# Patient Record
Sex: Female | Born: 1990 | Race: White | Hispanic: No | Smoking: Former smoker
Health system: Southern US, Community
[De-identification: ages and names within clinical notes are randomized; demographics above are authoritative.]

## PROBLEM LIST (undated history)

## (undated) DIAGNOSIS — F3181 Bipolar II disorder: Secondary | ICD-10-CM

## (undated) DIAGNOSIS — F419 Anxiety disorder, unspecified: Secondary | ICD-10-CM

## (undated) DIAGNOSIS — R011 Cardiac murmur, unspecified: Secondary | ICD-10-CM

## (undated) DIAGNOSIS — F909 Attention-deficit hyperactivity disorder, unspecified type: Secondary | ICD-10-CM

## (undated) DIAGNOSIS — F329 Major depressive disorder, single episode, unspecified: Secondary | ICD-10-CM

## (undated) DIAGNOSIS — F32A Depression, unspecified: Secondary | ICD-10-CM

## (undated) HISTORY — PX: CLAVICLE SURGERY: SHX598

## (undated) HISTORY — PX: PARTIAL HIP ARTHROPLASTY: SHX733

## (undated) HISTORY — DX: Bipolar II disorder: F31.81

## (undated) HISTORY — DX: Major depressive disorder, single episode, unspecified: F32.9

## (undated) HISTORY — DX: Anxiety disorder, unspecified: F41.9

## (undated) HISTORY — DX: Depression, unspecified: F32.A

## (undated) HISTORY — PX: CARDIAC SURGERY: SHX584

## (undated) HISTORY — DX: Cardiac murmur, unspecified: R01.1

## (undated) HISTORY — DX: Attention-deficit hyperactivity disorder, unspecified type: F90.9

---
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
## 2015-10-09 ENCOUNTER — Encounter: Payer: Self-pay | Admitting: Adult Health

## 2017-01-04 DIAGNOSIS — L709 Acne, unspecified: Secondary | ICD-10-CM | POA: Diagnosis not present

## 2017-01-04 DIAGNOSIS — D229 Melanocytic nevi, unspecified: Secondary | ICD-10-CM | POA: Diagnosis not present

## 2017-01-04 DIAGNOSIS — L308 Other specified dermatitis: Secondary | ICD-10-CM | POA: Diagnosis not present

## 2017-02-15 DIAGNOSIS — Z0000 Encounter for general adult medical examination without abnormal findings: Secondary | ICD-10-CM | POA: Diagnosis not present

## 2017-02-15 DIAGNOSIS — R5383 Other fatigue: Secondary | ICD-10-CM | POA: Diagnosis not present

## 2017-02-15 DIAGNOSIS — L858 Other specified epidermal thickening: Secondary | ICD-10-CM | POA: Diagnosis not present

## 2017-02-15 DIAGNOSIS — L709 Acne, unspecified: Secondary | ICD-10-CM | POA: Diagnosis not present

## 2017-05-16 DIAGNOSIS — J40 Bronchitis, not specified as acute or chronic: Secondary | ICD-10-CM | POA: Diagnosis not present

## 2017-05-16 DIAGNOSIS — R05 Cough: Secondary | ICD-10-CM | POA: Diagnosis not present

## 2017-06-21 ENCOUNTER — Encounter: Payer: Self-pay | Admitting: Physician Assistant

## 2017-06-21 ENCOUNTER — Ambulatory Visit (INDEPENDENT_AMBULATORY_CARE_PROVIDER_SITE_OTHER): Payer: 59 | Admitting: Physician Assistant

## 2017-06-21 VITALS — BP 115/78 | HR 85 | Temp 98.9°F | Ht 64.5 in | Wt 223.8 lb

## 2017-06-21 DIAGNOSIS — F419 Anxiety disorder, unspecified: Secondary | ICD-10-CM | POA: Diagnosis not present

## 2017-06-21 DIAGNOSIS — F321 Major depressive disorder, single episode, moderate: Secondary | ICD-10-CM | POA: Diagnosis not present

## 2017-06-21 DIAGNOSIS — F39 Unspecified mood [affective] disorder: Secondary | ICD-10-CM | POA: Diagnosis not present

## 2017-06-21 MED ORDER — ARIPIPRAZOLE 5 MG PO TABS: 5.0000 mg | ORAL_TABLET | Freq: Every day | ORAL | 1 refills | Status: DC

## 2017-06-21 MED ORDER — TRAZODONE HCL 50 MG PO TABS: 50.0000 mg | ORAL_TABLET | Freq: Every day | ORAL | 1 refills | Status: DC

## 2017-06-21 MED ORDER — HYDROXYZINE HCL 25 MG PO TABS: 25.0000 mg | ORAL_TABLET | Freq: Three times a day (TID) | ORAL | 5 refills | Status: DC

## 2017-06-21 NOTE — Patient Instructions (Signed)
In a few days you may receive a survey in the mail or online from [employer] regarding your visit with us today. Please take a moment to fill this out. Your feedback is very important to our whole office. It can help us better understand your needs as well as improve your experience and satisfaction. Thank you for taking your time to complete it. We care about you.  Lucee Brissett, PA-C  

## 2017-06-21 NOTE — Progress Notes (Signed)
BP 115/78   Pulse 85   Temp 98.9 F (37.2 C) (Oral)   Ht 5' 4.5" (1.638 m)   Wt 223 lb 12.8 oz (101.5 kg)   LMP 05/22/2017   BMI 37.82 kg/m    Subjective:    Patient ID: Paula Elliott, female    DOB: 1991/01/14, 26 y.o.   MRN: [id number]  HPI: Paula Elliott is a 26 y.o. female presenting on 06/21/2017 for New Patient (Initial Visit) and Establish Care  This is a new patient to our office. She was formerly seen with Dr. Wenda Overland. She comes here to be established for her treatment of her depression and anxiety. She also has chronic acne. She is seen by Dr. Denna Haggard for this. Over the years the patient has seen a therapist on a regular basis for her depression and anxiety. She has taken depression medication in the past. Wellbutrin and it made her feel more anxious. She has been on Prozac now for a few months. She has gone up to 40 mg. She states she still has significant anxiety and very significant sleep disturbance. She has never been checked for mood disorder. Her mood MDQ score is 8, with overall minor problem in functioning. She has not been arrested for any behaviors. Due to the difficulty of treating her depression with an SSRI alone I have suggested that we try Abilify to augment this treatment. Plus it may help some with the agitation symptoms that are found more in mood disorder. Depression screen PHQ 2/9 06/21/2017  Decreased Interest 3  Down, Depressed, Hopeless 1  PHQ - 2 Score 4  Altered sleeping 3  Tired, decreased energy 3  Change in appetite 3  Feeling bad or failure about yourself  2  Trouble concentrating 2  Moving slowly or fidgety/restless 0  Suicidal thoughts 0  PHQ-9 Score 17     Relevant past medical, surgical, family and social history reviewed and updated as indicated. Allergies and medications reviewed and updated.  Past Medical History:  Diagnosis Date  . Anxiety   . Depression   . Heart murmur     Past Surgical History:  Procedure  Laterality Date  . HIP OPEN REDUCTION  1994  . open heart   1996    Review of Systems  Constitutional: Negative.  Negative for activity change, fatigue and fever.  HENT: Negative.   Eyes: Negative.   Respiratory: Negative.  Negative for cough.   Cardiovascular: Negative.  Negative for chest pain.  Gastrointestinal: Negative.  Negative for abdominal pain.  Endocrine: Negative.   Genitourinary: Negative.  Negative for dysuria.  Musculoskeletal: Negative.   Skin: Negative.   Neurological: Negative.   Psychiatric/Behavioral: Positive for agitation, dysphoric mood and sleep disturbance. Negative for self-injury and suicidal ideas. The patient is nervous/anxious.     Allergies as of 06/21/2017   No Known Allergies     Medication List       Accurate as of 06/21/17  3:35 PM. Always use your most recent med list.          ARIPiprazole 5 MG tablet Commonly known as:  ABILIFY Take 1 tablet (5 mg total) by mouth daily.   clindamycin 1 % gel Commonly known as:  CLINDAGEL Apply topically 2 (two) times daily.   FLUoxetine 40 MG capsule Commonly known as:  PROZAC Take 40 mg by mouth daily.   hydrOXYzine 25 MG tablet Commonly known as:  ATARAX/VISTARIL Take 1 tablet (25 mg total) by mouth 3 (three)  times daily.   minocycline 50 MG capsule Commonly known as:  MINOCIN,DYNACIN Take by mouth daily.   traZODone 50 MG tablet Commonly known as:  DESYREL Take 1 tablet (50 mg total) by mouth at bedtime.          Objective:    BP 115/78   Pulse 85   Temp 98.9 F (37.2 C) (Oral)   Ht 5' 4.5" (1.638 m)   Wt 223 lb 12.8 oz (101.5 kg)   LMP 05/22/2017   BMI 37.82 kg/m   No Known Allergies  Physical Exam  Constitutional: She is oriented to person, place, and time. She appears well-developed and well-nourished.  HENT:  Head: Normocephalic and atraumatic.  Right Ear: Tympanic membrane, external ear and ear canal normal.  Left Ear: Tympanic membrane, external ear and ear  canal normal.  Nose: Nose normal. No rhinorrhea.  Mouth/Throat: Oropharynx is clear and moist and mucous membranes are normal. No oropharyngeal exudate or posterior oropharyngeal erythema.  Eyes: Pupils are equal, round, and reactive to light. Conjunctivae and EOM are normal.  Neck: Normal range of motion. Neck supple.  Cardiovascular: Normal rate, regular rhythm, normal heart sounds and intact distal pulses.   Pulmonary/Chest: Effort normal and breath sounds normal.  Abdominal: Soft. Bowel sounds are normal.  Neurological: She is alert and oriented to person, place, and time. She has normal reflexes.  Skin: Skin is warm and dry. No rash noted.  Psychiatric: Her speech is normal and behavior is normal. Judgment and thought content normal. Her mood appears anxious. Her affect is not inappropriate. Cognition and memory are normal.    No results found for this or any previous visit.    Assessment & Plan:   1. Depression, major, single episode, moderate (HCC) - FLUoxetine (PROZAC) 40 MG capsule; Take 40 mg by mouth daily.; Refill: 2 - ARIPiprazole (ABILIFY) 5 MG tablet; Take 1 tablet (5 mg total) by mouth daily.  Dispense: 30 tablet; Refill: 1 - traZODone (DESYREL) 50 MG tablet; Take 1 tablet (50 mg total) by mouth at bedtime.  Dispense: 30 tablet; Refill: 1  2. Mood disorder (HCC) - ARIPiprazole (ABILIFY) 5 MG tablet; Take 1 tablet (5 mg total) by mouth daily.  Dispense: 30 tablet; Refill: 1  3. Anxiety - hydrOXYzine (ATARAX/VISTARIL) 25 MG tablet; Take 1 tablet (25 mg total) by mouth 3 (three) times daily.  Dispense: 90 tablet; Refill: 5   Current Outpatient Prescriptions:  .  clindamycin (CLINDAGEL) 1 % gel, Apply topically 2 (two) times daily., Disp: , Rfl:  .  FLUoxetine (PROZAC) 40 MG capsule, Take 40 mg by mouth daily., Disp: , Rfl: 2 .  hydrOXYzine (ATARAX/VISTARIL) 25 MG tablet, Take 1 tablet (25 mg total) by mouth 3 (three) times daily., Disp: 90 tablet, Rfl: 5 .  minocycline  (MINOCIN,DYNACIN) 50 MG capsule, Take by mouth daily., Disp: , Rfl: 3 .  ARIPiprazole (ABILIFY) 5 MG tablet, Take 1 tablet (5 mg total) by mouth daily., Disp: 30 tablet, Rfl: 1 .  traZODone (DESYREL) 50 MG tablet, Take 1 tablet (50 mg total) by mouth at bedtime., Disp: 30 tablet, Rfl: 1  Continue all other maintenance medications as listed above.  Follow up plan: Return in about 4 weeks (around 07/19/2017) for recheck.  Educational handout given for No Name PA-C [facility] [address]  [place], [place] [address] [phone]   06/21/2017, 3:35 PM

## 2017-07-19 ENCOUNTER — Ambulatory Visit (INDEPENDENT_AMBULATORY_CARE_PROVIDER_SITE_OTHER): Payer: 59 | Admitting: Physician Assistant

## 2017-07-19 ENCOUNTER — Encounter: Payer: Self-pay | Admitting: Physician Assistant

## 2017-07-19 VITALS — BP 125/80 | HR 83 | Temp 98.6°F | Ht 64.5 in | Wt 228.2 lb

## 2017-07-19 DIAGNOSIS — F410 Panic disorder [episodic paroxysmal anxiety] without agoraphobia: Secondary | ICD-10-CM | POA: Diagnosis not present

## 2017-07-19 DIAGNOSIS — F321 Major depressive disorder, single episode, moderate: Secondary | ICD-10-CM | POA: Diagnosis not present

## 2017-07-19 DIAGNOSIS — F39 Unspecified mood [affective] disorder: Secondary | ICD-10-CM | POA: Diagnosis not present

## 2017-07-19 MED ORDER — ALPRAZOLAM 0.5 MG PO TABS: 0.5000 mg | ORAL_TABLET | Freq: Two times a day (BID) | ORAL | 0 refills | Status: DC | PRN

## 2017-07-19 MED ORDER — ARIPIPRAZOLE 15 MG PO TABS: 15.0000 mg | ORAL_TABLET | Freq: Every day | ORAL | 1 refills | Status: DC

## 2017-07-19 NOTE — Patient Instructions (Signed)
In a few days you may receive a survey in the mail or online from [employer] regarding your visit with us today. Please take a moment to fill this out. Your feedback is very important to our whole office. It can help us better understand your needs as well as improve your experience and satisfaction. Thank you for taking your time to complete it. We care about you.  Shandel Busic, PA-C  

## 2017-07-19 NOTE — Progress Notes (Signed)
BP 125/80   Pulse 83   Temp 98.6 F (37 C) (Oral)   Ht 5' 4.5" (1.638 m)   Wt 228 lb 3.2 oz (103.5 kg)   BMI 38.57 kg/m    Subjective:    Patient ID: Paula Elliott, female    DOB: 1991/06/06, 26 y.o.   MRN: [id number]  HPI: Paula Elliott is a 26 y.o. female presenting on 07/19/2017 for Follow-up (1 month rck on depression )  This patient comes in for periodic recheck on medications and conditions including Depression, mood disorder, panic attack. She reports that she had more panic attacks this past month. She was driving in [place] and had a severe one. She has never had a problem with that in the past. The hydroxyzine has not made a big difference in her symptoms.    All medications are reviewed today. There are no reports of any problems with the medications. All of the medical conditions are reviewed and updated.  Lab work is reviewed and will be ordered as medically necessary. There are no new problems reported with today's visit.   Relevant past medical, surgical, family and social history reviewed and updated as indicated. Allergies and medications reviewed and updated.  Past Medical History:  Diagnosis Date  . Anxiety   . Depression   . Heart murmur     Past Surgical History:  Procedure Laterality Date  . HIP OPEN REDUCTION  1994  . open heart   1996    Review of Systems  Constitutional: Negative.   HENT: Negative.   Eyes: Negative.   Respiratory: Negative.   Gastrointestinal: Negative.   Genitourinary: Negative.   Psychiatric/Behavioral: Positive for decreased concentration. The patient is nervous/anxious.     Allergies as of 07/19/2017   No Known Allergies     Medication List       Accurate as of 07/19/17 11:59 PM. Always use your most recent med list.          ALPRAZolam 0.5 MG tablet Commonly known as:  XANAX Take 1 tablet (0.5 mg total) by mouth 2 (two) times daily as needed for anxiety.   ARIPiprazole 15 MG  tablet Commonly known as:  ABILIFY Take 1 tablet (15 mg total) by mouth daily.   clindamycin 1 % gel Commonly known as:  CLINDAGEL Apply topically 2 (two) times daily.   FLUoxetine 40 MG capsule Commonly known as:  PROZAC Take 40 mg by mouth daily.   hydrOXYzine 25 MG tablet Commonly known as:  ATARAX/VISTARIL Take 1 tablet (25 mg total) by mouth 3 (three) times daily.   minocycline 50 MG capsule Commonly known as:  MINOCIN,DYNACIN Take by mouth daily.   traZODone 50 MG tablet Commonly known as:  DESYREL Take 1 tablet (50 mg total) by mouth at bedtime.            Discharge Care Instructions        Start     Ordered   07/19/17 0000  ARIPiprazole (ABILIFY) 15 MG tablet  Daily    Question:  Supervising Provider  Answer:  Timmothy Euler   07/19/17 1421   07/19/17 0000  ALPRAZolam (XANAX) 0.5 MG tablet  2 times daily PRN    Question:  Supervising Provider  Answer:  Timmothy Euler   07/19/17 1421         Objective:    BP 125/80   Pulse 83   Temp 98.6 F (37 C) (Oral)   Ht 5' 4.5" (  1.638 m)   Wt 228 lb 3.2 oz (103.5 kg)   BMI 38.57 kg/m   No Known Allergies  Physical Exam  Constitutional: She is oriented to person, place, and time. She appears well-developed and well-nourished.  HENT:  Head: Normocephalic and atraumatic.  Eyes: Pupils are equal, round, and reactive to light. Conjunctivae and EOM are normal.  Cardiovascular: Normal rate, regular rhythm, normal heart sounds and intact distal pulses.   Pulmonary/Chest: Effort normal and breath sounds normal.  Abdominal: Soft. Bowel sounds are normal.  Neurological: She is alert and oriented to person, place, and time. She has normal reflexes.  Skin: Skin is warm and dry. No rash noted.  Psychiatric: She has a normal mood and affect. Her behavior is normal. Judgment and thought content normal.  Nursing note and vitals reviewed.   No results found for this or any previous visit.    Assessment &  Plan:   1. Depression, major, single episode, moderate (HCC) - ARIPiprazole (ABILIFY) 15 MG tablet; Take 1 tablet (15 mg total) by mouth daily.  Dispense: 30 tablet; Refill: 1  2. Mood disorder (HCC) - ARIPiprazole (ABILIFY) 15 MG tablet; Take 1 tablet (15 mg total) by mouth daily.  Dispense: 30 tablet; Refill: 1  3. Panic attacks - ALPRAZolam (XANAX) 0.5 MG tablet; Take 1 tablet (0.5 mg total) by mouth 2 (two) times daily as needed for anxiety.  Dispense: 30 tablet; Refill: 0    Current Outpatient Prescriptions:  .  ARIPiprazole (ABILIFY) 15 MG tablet, Take 1 tablet (15 mg total) by mouth daily., Disp: 30 tablet, Rfl: 1 .  clindamycin (CLINDAGEL) 1 % gel, Apply topically 2 (two) times daily., Disp: , Rfl:  .  FLUoxetine (PROZAC) 40 MG capsule, Take 40 mg by mouth daily., Disp: , Rfl: 2 .  hydrOXYzine (ATARAX/VISTARIL) 25 MG tablet, Take 1 tablet (25 mg total) by mouth 3 (three) times daily., Disp: 90 tablet, Rfl: 5 .  minocycline (MINOCIN,DYNACIN) 50 MG capsule, Take by mouth daily., Disp: , Rfl: 3 .  traZODone (DESYREL) 50 MG tablet, Take 1 tablet (50 mg total) by mouth at bedtime., Disp: 30 tablet, Rfl: 1 .  ALPRAZolam (XANAX) 0.5 MG tablet, Take 1 tablet (0.5 mg total) by mouth 2 (two) times daily as needed for anxiety., Disp: 30 tablet, Rfl: 0 Continue all other maintenance medications as listed above.  Follow up plan: Return in about 4 weeks (around 08/16/2017) for recheck.  Educational handout given for [place] PA-C [facility] [address]  [place], [place] [address] [phone]   07/20/2017, 2:11 PM

## 2017-07-20 DIAGNOSIS — F410 Panic disorder [episodic paroxysmal anxiety] without agoraphobia: Secondary | ICD-10-CM | POA: Insufficient documentation

## 2017-07-27 ENCOUNTER — Telehealth: Payer: Self-pay | Admitting: Physician Assistant

## 2017-07-27 NOTE — Telephone Encounter (Signed)
The Abilify not working at all and Xanax states she has to take 2 to feel calm at all from a panic attack. Patient use [facility]. Please advise.

## 2017-07-27 NOTE — Telephone Encounter (Signed)
Pt notified of recommendation Will discuss at next appt on 9/21 per pt

## 2017-07-27 NOTE — Telephone Encounter (Signed)
Cannot refill this outside of an appointment. Needs to be seen.

## 2017-08-19 ENCOUNTER — Encounter (INDEPENDENT_AMBULATORY_CARE_PROVIDER_SITE_OTHER): Payer: Self-pay

## 2017-08-19 ENCOUNTER — Encounter: Payer: Self-pay | Admitting: Physician Assistant

## 2017-08-19 ENCOUNTER — Ambulatory Visit (INDEPENDENT_AMBULATORY_CARE_PROVIDER_SITE_OTHER): Payer: 59 | Admitting: Physician Assistant

## 2017-08-19 VITALS — BP 98/68 | HR 84 | Temp 99.0°F | Ht 64.5 in | Wt 232.0 lb

## 2017-08-19 DIAGNOSIS — F39 Unspecified mood [affective] disorder: Secondary | ICD-10-CM | POA: Diagnosis not present

## 2017-08-19 DIAGNOSIS — F419 Anxiety disorder, unspecified: Secondary | ICD-10-CM

## 2017-08-19 DIAGNOSIS — F321 Major depressive disorder, single episode, moderate: Secondary | ICD-10-CM

## 2017-08-19 NOTE — Progress Notes (Signed)
BP 98/68   Pulse 84   Temp 99 F (37.2 C) (Oral)   Ht 5' 4.5" (1.638 m)   Wt 232 lb (105.2 kg)   BMI 39.21 kg/m    Subjective:    Patient ID: Paula Elliott, female    DOB: 09-Dec-1990, 26 y.o.   MRN: [id number]  HPI: Paula Elliott is a 26 y.o. female presenting on 08/19/2017 for Follow-up (4 week rck)  This patient comes in for recheck on her medications. She is being seen by [facility] for psychiatry and treatment. There've been some changes to her medication. These are noted in her med list. She states that she is only been changed for less than a week. She cannot tell a big difference yet. She is comfortable going to this practice. She is still seeing a psychologist for counseling.  I've encouraged her to continue with this line of treatment as I think it will be very beneficial for her. She can return to [place] as needed. We will see her back if she is sick in any way.  Relevant past medical, surgical, family and social history reviewed and updated as indicated. Allergies and medications reviewed and updated.  Past Medical History:  Diagnosis Date  . Anxiety   . Depression   . Heart murmur     Past Surgical History:  Procedure Laterality Date  . HIP OPEN REDUCTION  1994  . open heart   1996    Review of Systems  Constitutional: Negative.   HENT: Negative.   Eyes: Negative.   Respiratory: Negative.   Gastrointestinal: Negative.   Genitourinary: Negative.   Psychiatric/Behavioral: Positive for agitation, decreased concentration, dysphoric mood and sleep disturbance. Negative for suicidal ideas. The patient is nervous/anxious.     Allergies as of 08/19/2017   No Known Allergies     Medication List       Accurate as of 08/19/17  5:51 PM. Always use your most recent med list.          ARIPiprazole 5 MG tablet Commonly known as:  ABILIFY Take 5 mg by mouth daily.   clindamycin 1 % gel Commonly known as:  CLINDAGEL Apply topically  2 (two) times daily.   FLUoxetine 40 MG capsule Commonly known as:  PROZAC Take 40 mg by mouth daily.   hydrOXYzine 25 MG tablet Commonly known as:  ATARAX/VISTARIL Take 1 tablet (25 mg total) by mouth 3 (three) times daily.   lamoTRIgine 25 MG tablet Commonly known as:  LAMICTAL TAKE ONE TABLET BY MOUTH DAILY FOR 14 DAYS, THEN TAKE TWO DAILY FOR 14 DAYS, THEN INCREASE TO FOUR DAILY (CALL AND AT NOON IF TO RASH) (IF O   LORazepam 0.5 MG tablet Commonly known as:  ATIVAN take ONE-HALF - ONE tablet BY MOUTH EVERY 6 HOURS AS NEEDED FOR SEVERE FOR ANXIETY (this is a 30 DAY supply)   minocycline 50 MG capsule Commonly known as:  MINOCIN,DYNACIN Take by mouth daily.   propranolol 20 MG tablet Commonly known as:  INDERAL TAKE ONE TABLET BY MOUTH EVERY 6 HOURS AS NEEDED FOR FOR ANXIETY   traZODone 50 MG tablet Commonly known as:  DESYREL Take 1 tablet (50 mg total) by mouth at bedtime.          Objective:    BP 98/68   Pulse 84   Temp 99 F (37.2 C) (Oral)   Ht 5' 4.5" (1.638 m)   Wt 232 lb (105.2 kg)   BMI  39.21 kg/m   No Known Allergies  Physical Exam  Constitutional: She is oriented to person, place, and time. She appears well-developed and well-nourished.  HENT:  Head: Normocephalic and atraumatic.  Eyes: Pupils are equal, round, and reactive to light. Conjunctivae and EOM are normal.  Cardiovascular: Normal rate, regular rhythm, normal heart sounds and intact distal pulses.   Pulmonary/Chest: Effort normal and breath sounds normal.  Abdominal: Soft. Bowel sounds are normal.  Neurological: She is alert and oriented to person, place, and time. She has normal reflexes.  Skin: Skin is warm and dry. No rash noted.  Psychiatric: She has a normal mood and affect. Her behavior is normal. Judgment and thought content normal.  Nursing note and vitals reviewed.   No results found for this or any previous visit.    Assessment & Plan:   1. Depression, major, single  episode, moderate ([facility]) Continue counseling with [facility]. - ARIPiprazole (ABILIFY) 5 MG tablet; Take 5 mg by mouth daily.; Refill: 1 - lamoTRIgine (LAMICTAL) 25 MG tablet; TAKE ONE TABLET BY MOUTH DAILY FOR 14 DAYS, THEN TAKE TWO DAILY FOR 14 DAYS, THEN INCREASE TO FOUR DAILY (CALL AND AT NOON IF TO RASH) (IF O; Refill: 0  2. Anxiety - propranolol (INDERAL) 20 MG tablet; TAKE ONE TABLET BY MOUTH EVERY 6 HOURS AS NEEDED FOR FOR ANXIETY; Refill: 1 - LORazepam (ATIVAN) 0.5 MG tablet; take ONE-HALF - ONE tablet BY MOUTH EVERY 6 HOURS AS NEEDED FOR SEVERE FOR ANXIETY (this is a 30 DAY supply); Refill: 1  3. Mood disorder (HCC) - ARIPiprazole (ABILIFY) 5 MG tablet; Take 5 mg by mouth daily.; Refill: 1 - lamoTRIgine (LAMICTAL) 25 MG tablet; TAKE ONE TABLET BY MOUTH DAILY FOR 14 DAYS, THEN TAKE TWO DAILY FOR 14 DAYS, THEN INCREASE TO FOUR DAILY (CALL AND AT NOON IF TO RASH) (IF O; Refill: 0   [facility] to follow with [facility]. We will see her back as needed. I have asked her to come back in 6 months. She can call if she is sick in any way.   Current Outpatient Prescriptions:  .  ARIPiprazole (ABILIFY) 5 MG tablet, Take 5 mg by mouth daily., Disp: , Rfl: 1 .  clindamycin (CLINDAGEL) 1 % gel, Apply topically 2 (two) times daily., Disp: , Rfl:  .  FLUoxetine (PROZAC) 40 MG capsule, Take 40 mg by mouth daily., Disp: , Rfl: 2 .  hydrOXYzine (ATARAX/VISTARIL) 25 MG tablet, Take 1 tablet (25 mg total) by mouth 3 (three) times daily., Disp: 90 tablet, Rfl: 5 .  lamoTRIgine (LAMICTAL) 25 MG tablet, TAKE ONE TABLET BY MOUTH DAILY FOR 14 DAYS, THEN TAKE TWO DAILY FOR 14 DAYS, THEN INCREASE TO FOUR DAILY (CALL AND AT NOON IF TO RASH) (IF O, Disp: , Rfl: 0 .  LORazepam (ATIVAN) 0.5 MG tablet, take ONE-HALF - ONE tablet BY MOUTH EVERY 6 HOURS AS NEEDED FOR SEVERE FOR ANXIETY (this is a 30 DAY supply), Disp: , Rfl: 1 .  minocycline (MINOCIN,DYNACIN) 50 MG capsule, Take by mouth daily., Disp: , Rfl:  3 .  propranolol (INDERAL) 20 MG tablet, TAKE ONE TABLET BY MOUTH EVERY 6 HOURS AS NEEDED FOR FOR ANXIETY, Disp: , Rfl: 1 .  traZODone (DESYREL) 50 MG tablet, Take 1 tablet (50 mg total) by mouth at bedtime., Disp: 30 tablet, Rfl: 1 Continue all other maintenance medications as listed above.  Follow up plan: Return in about 6 months (around 02/16/2018).  Educational handout given for survey  Terald Sleeper PA-C [facility] [address]  [place], [place] [address] [phone]   08/19/2017, 5:51 PM

## 2017-08-19 NOTE — Patient Instructions (Signed)
In a few days you may receive a survey in the mail or online from [employer] regarding your visit with us today. Please take a moment to fill this out. Your feedback is very important to our whole office. It can help us better understand your needs as well as improve your experience and satisfaction. Thank you for taking your time to complete it. We care about you.  Brayton Baumgartner, PA-C  

## 2017-10-11 DIAGNOSIS — H16042 Marginal corneal ulcer, left eye: Secondary | ICD-10-CM | POA: Diagnosis not present

## 2017-11-30 ENCOUNTER — Telehealth: Payer: Self-pay | Admitting: Physician Assistant

## 2017-11-30 NOTE — Telephone Encounter (Signed)
Mom states that patient has been having constipation x 1 week. Has been eating prunes and taking otc women's laxatives and it is not helping. Wanting to see Paula Elliott and has apt with her 1/11- wanting to know if there is anything else she can try until her apt with Paula Elliott? Covering PCP please advise

## 2017-11-30 NOTE — Telephone Encounter (Signed)
Needs to be seen

## 2017-12-01 ENCOUNTER — Encounter: Payer: Self-pay | Admitting: Nurse Practitioner

## 2017-12-01 ENCOUNTER — Ambulatory Visit (INDEPENDENT_AMBULATORY_CARE_PROVIDER_SITE_OTHER): Payer: 59

## 2017-12-01 ENCOUNTER — Ambulatory Visit: Payer: 59 | Admitting: Nurse Practitioner

## 2017-12-01 VITALS — BP 114/79 | HR 70 | Temp 97.9°F | Ht 64.0 in | Wt 223.0 lb

## 2017-12-01 DIAGNOSIS — K5900 Constipation, unspecified: Secondary | ICD-10-CM | POA: Diagnosis not present

## 2017-12-01 DIAGNOSIS — R109 Unspecified abdominal pain: Secondary | ICD-10-CM

## 2017-12-01 MED ORDER — LINACLOTIDE 145 MCG PO CAPS: 145.0000 ug | ORAL_CAPSULE | Freq: Every day | ORAL | 0 refills | Status: DC

## 2017-12-01 NOTE — Telephone Encounter (Signed)
Pt given appt today with MMM at 2:15.

## 2017-12-01 NOTE — Patient Instructions (Signed)

## 2017-12-01 NOTE — Progress Notes (Signed)
   Subjective:    Patient ID: Paula Elliott, female    DOB: 16-Jul-1991, 27 y.o.   MRN: [id number]  HPI Patient comes in today c/o constipation her whole life. Laxative helps some but then goes back to constipation. Took laxative yesterday and had good results this morning    Review of Systems  Constitutional: Negative.   Respiratory: Negative.   Cardiovascular: Negative.   Gastrointestinal: Negative.   Genitourinary: Negative.   Neurological: Negative.   Psychiatric/Behavioral: Negative.   All other systems reviewed and are negative.      Objective:   Physical Exam  Constitutional: She is oriented to person, place, and time. She appears well-developed and well-nourished. No distress.  Cardiovascular: Normal rate and regular rhythm.  Pulmonary/Chest: Effort normal and breath sounds normal.  Neurological: She is alert and oriented to person, place, and time.  Skin: Skin is warm.  Psychiatric: She has a normal mood and affect. Her behavior is normal. Judgment and thought content normal.   BP 114/79   Pulse 70   Temp 97.9 F (36.6 C) (Oral)   Ht 5\' 4"  (1.626 m)   Wt 223 lb (101.2 kg)   BMI 38.28 kg/m   KUB- negative-Preliminary reading by Ronnald Collum, FNP  [facility]     Assessment & Plan:  1. Abdominal pain, unspecified abdominal location - DG Abd 1 View; Future  2. Constipation, unspecified constipation type Meds ordered this encounter  Medications  . linaclotide (LINZESS) 145 MCG CAPS capsule    Sig: Take 1 capsule (145 mcg total) by mouth daily before breakfast.    Dispense:  24 capsule    Refill:  0    Order Specific Question:   Supervising Provider    Answer:   Eustaquio Maize [[id number]]   Force fluids Increase fiber in diet Follow up prn  Mary-Margaret Hassell Done, FNP

## 2017-12-09 ENCOUNTER — Encounter: Payer: Self-pay | Admitting: Physician Assistant

## 2017-12-09 ENCOUNTER — Ambulatory Visit: Payer: 59 | Admitting: Physician Assistant

## 2017-12-09 VITALS — BP 97/70 | HR 70 | Temp 97.4°F | Ht 64.0 in | Wt 225.0 lb

## 2017-12-09 DIAGNOSIS — K5909 Other constipation: Secondary | ICD-10-CM | POA: Insufficient documentation

## 2017-12-09 MED ORDER — LINACLOTIDE 72 MCG PO CAPS: 72.0000 ug | ORAL_CAPSULE | Freq: Every day | ORAL | 3 refills | Status: DC

## 2017-12-09 NOTE — Progress Notes (Signed)
BP 97/70   Pulse 70   Temp (!) 97.4 F (36.3 C) (Oral)   Ht 5\' 4"  (1.626 m)   Wt 225 lb (102.1 kg)   BMI 38.62 kg/m    Subjective:    Patient ID: Paula Elliott, female    DOB: 16-Jun-1991, 27 y.o.   MRN: [id number]  HPI: Paula Elliott is a 27 y.o. female presenting on 12/09/2017 for Abdominal Pain  Comes in for recheck of her chronic constipation.  A week or 2 ago she was started with Linzess 145 mg.  She can tell a great difference in her bowel movement.  She had gotten where she was only having 1 week up to every week and half.  He had a great amount of movement with this dose.  But at this point she is having at times some nausea primarily in the morning.  She takes the medication with food in the morning.  She is also having some just generalized pain.  She denies any changes in the appearance of her stool and no blood.  Relevant past medical, surgical, family and social history reviewed and updated as indicated. Allergies and medications reviewed and updated.  Past Medical History:  Diagnosis Date  . Anxiety   . Depression   . Heart murmur     Past Surgical History:  Procedure Laterality Date  . HIP OPEN REDUCTION  1994  . open heart   1996    Review of Systems  Constitutional: Negative.  Negative for activity change, fatigue and fever.  HENT: Negative.   Eyes: Negative.   Respiratory: Negative.  Negative for cough.   Cardiovascular: Negative.  Negative for chest pain.  Gastrointestinal: Positive for abdominal pain, constipation and nausea. Negative for diarrhea, rectal pain and vomiting.  Endocrine: Negative.   Genitourinary: Negative.  Negative for dysuria.  Musculoskeletal: Negative.   Skin: Negative.   Neurological: Negative.     Allergies as of 12/09/2017   No Known Allergies     Medication List        Accurate as of 12/09/17  9:04 AM. Always use your most recent med list.          hydrOXYzine 25 MG tablet Commonly known as:   ATARAX/VISTARIL Take 1 tablet (25 mg total) by mouth 3 (three) times daily.   linaclotide 72 MCG capsule Commonly known as:  LINZESS Take 1 capsule (72 mcg total) by mouth daily before breakfast.   metFORMIN 1000 MG tablet Commonly known as:  GLUCOPHAGE Take 1,000 mg by mouth daily. with food   minocycline 50 MG capsule Commonly known as:  MINOCIN,DYNACIN Take by mouth daily.   propranolol 20 MG tablet Commonly known as:  INDERAL TAKE ONE TABLET BY MOUTH EVERY 6 HOURS AS NEEDED FOR FOR ANXIETY   QUEtiapine 200 MG tablet Commonly known as:  SEROQUEL TAKE 1 TABLET BY MOUTH AT BEDTIME FOR 7 DAYS, THEN TWO TABLETS BY MOUTH AT BEDTIME THEREAFTER   traZODone 50 MG tablet Commonly known as:  DESYREL Take 1 tablet (50 mg total) by mouth at bedtime.          Objective:    BP 97/70   Pulse 70   Temp (!) 97.4 F (36.3 C) (Oral)   Ht 5\' 4"  (1.626 m)   Wt 225 lb (102.1 kg)   BMI 38.62 kg/m   No Known Allergies  Physical Exam  Constitutional: She is oriented to person, place, and time. She appears well-developed and well-nourished.  HENT:  Head: Normocephalic and atraumatic.  Eyes: Conjunctivae and EOM are normal. Pupils are equal, round, and reactive to light.  Cardiovascular: Normal rate, regular rhythm, normal heart sounds and intact distal pulses.  Pulmonary/Chest: Effort normal and breath sounds normal.  Abdominal: Soft. Bowel sounds are normal.  Neurological: She is alert and oriented to person, place, and time. She has normal reflexes.  Skin: Skin is warm and dry. No rash noted.  Psychiatric: She has a normal mood and affect. Her behavior is normal. Judgment and thought content normal.  Nursing note and vitals reviewed.   No results found for this or any previous visit.    Assessment & Plan:   1. Chronic constipation - linaclotide (LINZESS) 72 MCG capsule; Take 1 capsule (72 mcg total) by mouth daily before breakfast.  Dispense: 90 capsule; Refill:  3    Current Outpatient Medications:  .  hydrOXYzine (ATARAX/VISTARIL) 25 MG tablet, Take 1 tablet (25 mg total) by mouth 3 (three) times daily., Disp: 90 tablet, Rfl: 5 .  linaclotide (LINZESS) 72 MCG capsule, Take 1 capsule (72 mcg total) by mouth daily before breakfast., Disp: 90 capsule, Rfl: 3 .  metFORMIN (GLUCOPHAGE) 1000 MG tablet, Take 1,000 mg by mouth daily. with food, Disp: , Rfl: 0 .  minocycline (MINOCIN,DYNACIN) 50 MG capsule, Take by mouth daily., Disp: , Rfl: 3 .  propranolol (INDERAL) 20 MG tablet, TAKE ONE TABLET BY MOUTH EVERY 6 HOURS AS NEEDED FOR FOR ANXIETY, Disp: , Rfl: 1 .  QUEtiapine (SEROQUEL) 200 MG tablet, TAKE 1 TABLET BY MOUTH AT BEDTIME FOR 7 DAYS, THEN TWO TABLETS BY MOUTH AT BEDTIME THEREAFTER, Disp: , Rfl: 0 .  traZODone (DESYREL) 50 MG tablet, Take 1 tablet (50 mg total) by mouth at bedtime., Disp: 30 tablet, Rfl: 1 Continue all other maintenance medications as listed above.  Follow up plan: Return if symptoms worsen or fail to improve.  Educational handout given for Iselin PA-C [facility] [address]  [place], [place] [address] [phone]   12/09/2017, 9:04 AM

## 2017-12-09 NOTE — Patient Instructions (Signed)
In a few days you may receive a survey in the mail or online from [employer] regarding your visit with us today. Please take a moment to fill this out. Your feedback is very important to our whole office. It can help us better understand your needs as well as improve your experience and satisfaction. Thank you for taking your time to complete it. We care about you.  Tom Macpherson, PA-C  

## 2018-02-17 ENCOUNTER — Ambulatory Visit: Payer: 59 | Admitting: Physician Assistant

## 2018-02-20 ENCOUNTER — Ambulatory Visit: Payer: 59 | Admitting: Physician Assistant

## 2018-02-21 ENCOUNTER — Encounter: Payer: Self-pay | Admitting: Physician Assistant

## 2018-03-15 DIAGNOSIS — L709 Acne, unspecified: Secondary | ICD-10-CM | POA: Diagnosis not present

## 2018-06-26 ENCOUNTER — Ambulatory Visit: Payer: 59 | Admitting: Nurse Practitioner

## 2018-06-26 ENCOUNTER — Encounter: Payer: Self-pay | Admitting: Nurse Practitioner

## 2018-06-26 VITALS — BP 111/81 | HR 88 | Temp 97.8°F | Ht 64.0 in | Wt 214.0 lb

## 2018-06-26 DIAGNOSIS — R079 Chest pain, unspecified: Secondary | ICD-10-CM | POA: Diagnosis not present

## 2018-06-26 DIAGNOSIS — F410 Panic disorder [episodic paroxysmal anxiety] without agoraphobia: Secondary | ICD-10-CM | POA: Diagnosis not present

## 2018-06-26 MED ORDER — QUETIAPINE FUMARATE ER 400 MG PO TB24: 400.0000 mg | ORAL_TABLET | Freq: Every day | ORAL | 0 refills | Status: DC

## 2018-06-26 NOTE — Progress Notes (Addendum)
   Subjective:    Patient ID: Paula Elliott, female    DOB: Nov 18, 1991, 27 y.o.   MRN: [id number]   Chief Complaint: Chest Pain (Had open heart surgery as a child); Headache; and Nausea   HPI Patient come sin today c/o chest pain. Started yesterday along with panic attack. Has history of panic attacks. She is currently on propranolol, valium, hydroxyzine and seroquel. Has not had panic attack in a month or so. She is under a lot of stress though. Is better today but chest is still hurting as if something is sitting on her chest. She also has been having frequent headaches. Tylenol relieves headaches. She sees psych and her psych doctor has left and she has consultation with new one at end of August.  Review of Systems  Constitutional: Negative.   HENT: Negative.   Respiratory: Negative.   Cardiovascular: Negative.   Gastrointestinal: Positive for nausea.  Genitourinary: Negative.   Musculoskeletal: Negative.   Neurological: Positive for headaches.  Psychiatric/Behavioral: Negative.   All other systems reviewed and are negative.      Objective:   Physical Exam  Constitutional: She appears well-developed and well-nourished. No distress.  Eyes: Pupils are equal, round, and reactive to light.  Neck: Normal range of motion. Neck supple.  Cardiovascular: Regular rhythm and normal pulses.  Murmur heard.  Crescendo systolic murmur is present with a grade of 4/6. Pulmonary/Chest: Effort normal and breath sounds normal.  Musculoskeletal: Normal range of motion.  Skin: Skin is warm.   BP 111/81   Pulse 88   Temp 97.8 F (36.6 C) (Oral)   Ht 5\' 4"  (1.626 m)   Wt 214 lb (97.1 kg)   BMI 36.73 kg/m   EKG- NSR with BBB- Mary-Margaret Hassell Done, FNP       Assessment & Plan:  Paula Elliott in today with chief complaint of Chest Pain (Had open heart surgery as a child); Headache; and Nausea   1. Chest pain, unspecified type - EKG 12-Lead  2. Panic attacks Keep  appointment with psych Stress management Continue al current meds Follow up prn * pateitn wanted me to change psych meds which I was not comfortable doing since I have never sen her before and she sees psych. Told her to call new psych office and see if she could be put on cancellation list and be seen sooner. Mary-Margaret Hassell Done, FNP

## 2018-06-26 NOTE — Patient Instructions (Signed)

## 2018-08-25 ENCOUNTER — Encounter: Payer: Self-pay | Admitting: *Deleted

## 2018-09-06 ENCOUNTER — Other Ambulatory Visit: Payer: Self-pay

## 2018-09-06 MED ORDER — DULOXETINE HCL 60 MG PO CPEP: 60.0000 mg | ORAL_CAPSULE | Freq: Every day | ORAL | 0 refills | Status: DC

## 2018-09-11 ENCOUNTER — Encounter: Payer: Self-pay | Admitting: Physician Assistant

## 2018-09-11 ENCOUNTER — Ambulatory Visit: Payer: 59 | Admitting: Physician Assistant

## 2018-09-11 DIAGNOSIS — F419 Anxiety disorder, unspecified: Secondary | ICD-10-CM

## 2018-09-11 DIAGNOSIS — F319 Bipolar disorder, unspecified: Secondary | ICD-10-CM | POA: Diagnosis not present

## 2018-09-11 DIAGNOSIS — F331 Major depressive disorder, recurrent, moderate: Secondary | ICD-10-CM | POA: Diagnosis not present

## 2018-09-11 DIAGNOSIS — F411 Generalized anxiety disorder: Secondary | ICD-10-CM | POA: Diagnosis not present

## 2018-09-11 MED ORDER — HYDROXYZINE HCL 25 MG PO TABS: 25.0000 mg | ORAL_TABLET | Freq: Three times a day (TID) | ORAL | 2 refills | Status: DC | PRN

## 2018-09-11 MED ORDER — DIAZEPAM 5 MG PO TABS: 5.0000 mg | ORAL_TABLET | Freq: Two times a day (BID) | ORAL | 2 refills | Status: DC | PRN

## 2018-09-11 MED ORDER — QUETIAPINE FUMARATE 300 MG PO TABS: 600.0000 mg | ORAL_TABLET | Freq: Every day | ORAL | 2 refills | Status: DC

## 2018-09-11 MED ORDER — METFORMIN HCL 1000 MG PO TABS: 1000.0000 mg | ORAL_TABLET | Freq: Every day | ORAL | 2 refills | Status: DC

## 2018-09-11 MED ORDER — ARMODAFINIL 250 MG PO TABS: 250.0000 mg | ORAL_TABLET | Freq: Every morning | ORAL | 2 refills | Status: DC

## 2018-09-11 MED ORDER — DULOXETINE HCL 60 MG PO CPEP: 60.0000 mg | ORAL_CAPSULE | Freq: Every day | ORAL | 2 refills | Status: DC

## 2018-09-11 MED ORDER — VITAMIN B-12 500 MCG PO TABS: 500.0000 ug | ORAL_TABLET | Freq: Every day | ORAL | 2 refills | Status: DC

## 2018-09-11 NOTE — Progress Notes (Signed)
[facility] Med Check  Patient ID: Edward Trevino,  MRN: [id number]  PCP: Terald Sleeper, PA-C  Date of Evaluation: 09/11/2018 Time spent:15 minutes   HISTORY/CURRENT STATUS: HPI Gwendlyn presents for a routine 6-week follow-up.  At her initial visit on 07/25/2018 Cymbalta was added.  She states she really does not feel that much better.  She obsesses a lot, especially right before she goes to bed.  "If it was not for the Seroquel and Valium, I would not be able to go to sleep.  " States she has dark thoughts but not really suicidal.  "I am not going to do anything and I do not have a plan to hurt or kill myself.  But sometimes I just wish I did not have to live with this pain" she does not have a lot of motivation or energy.  She has to push herself to go to work.  She is not missing any days however.  She does not cry easily but wants to isolate a lot.  She does not enjoy much of anything. The only big difference since her last visit is the panic attacks not as often or severe.  They are happening maybe once a week now.  The Valium still helps.  She usually only takes 1 at bedtime though. No increased energy with decreased need for sleep, no increased risky behavior or impulsivity, no increased libido, increased spending, grandiosity, gambling, or other manic symptoms. She is having trouble with several of her friends.  1 of her friends that she is known since eighth grade blocked her own all social media she feels deserted.  Individual Medical History/ Review of Systems: Changes? :No  Allergies: Patient has no known allergies.  Current Medications:  Current Outpatient Medications:  .  Armodafinil 250 MG tablet, Take 1 tablet (250 mg total) by mouth every morning., Disp: 30 tablet, Rfl: 2 .  diazepam (VALIUM) 5 MG tablet, Take 1 tablet (5 mg total) by mouth every 12 (twelve) hours as needed for anxiety., Disp: 60 tablet, Rfl: 2 .  DULoxetine (CYMBALTA) 60 MG capsule, Take 1  capsule (60 mg total) by mouth daily., Disp: 30 capsule, Rfl: 2 .  hydrOXYzine (ATARAX/VISTARIL) 25 MG tablet, Take 1 tablet (25 mg total) by mouth every 8 (eight) hours as needed., Disp: 90 tablet, Rfl: 2 .  metFORMIN (GLUCOPHAGE) 1000 MG tablet, Take 1 tablet (1,000 mg total) by mouth daily. with food, Disp: 30 tablet, Rfl: 2 .  minocycline (MINOCIN,DYNACIN) 50 MG capsule, Take by mouth daily., Disp: , Rfl: 3 .  propranolol (INDERAL) 20 MG tablet, TAKE ONE TABLET BY MOUTH EVERY 6 HOURS AS NEEDED FOR FOR ANXIETY, Disp: , Rfl: 1 .  QUEtiapine (SEROQUEL) 300 MG tablet, Take 2 tablets (600 mg total) by mouth at bedtime., Disp: 60 tablet, Rfl: 2 .  traZODone (DESYREL) 50 MG tablet, Take 1 tablet (50 mg total) by mouth at bedtime., Disp: 30 tablet, Rfl: 1 .  vitamin B-12 (CYANOCOBALAMIN) 500 MCG tablet, Take 1 tablet (500 mcg total) by mouth daily., Disp: 30 tablet, Rfl: 2 Medication Side Effects: None  Family Medical/ Social History: Changes? No  MENTAL HEALTH EXAM:  There were no vitals taken for this visit.There is no height or weight on file to calculate BMI.  General Appearance: Well Groomed  Eye Contact:  Good  Speech:  Clear and Coherent  Volume:  Normal  Mood:  Depressed  Affect:  Depressed  Thought Process:  Goal Directed  Orientation:  Full (  Time, Place, and Person)  Thought Content: Logical   Suicidal Thoughts:  Yes.  without intent/plan" dark thoughts. I don't have a plan but sometimes I'd rather not be here."  Homicidal Thoughts:  No  Memory:  Immediate  Judgement:  Good  Insight:  Good  Psychomotor Activity:  Normal  Concentration:  Concentration: Fair  Recall:  Good  Fund of Knowledge: Good  Language: Good  Akathisia:  No  AIMS (if indicated): not done  Assets:  Desire for Improvement  ADL's:  Intact  Cognition: WNL  Prognosis:  Good  See PHQ 2 9 and GAD  DIAGNOSES:    ICD-10-CM   1. Bipolar I disorder ([place]) F31.9   2. Generalized anxiety disorder F41.1   3.  Major depressive disorder, recurrent episode, moderate (HCC) F33.1   4. Anxiety F41.9 hydrOXYzine (ATARAX/VISTARIL) 25 MG tablet    RECOMMENDATIONS: Options are to increase the Cymbalta or the Seroquel.  I believe under the circumstances it would be safer and more helpful to increase the Seroquel.  I believe that will help both the depression and anxiety and definitely have less risk of inducing a manic episode. Increase Seroquel to 600 mg total. Continue Cymbalta 60 mg every morning Continue Valium 5 mg 1 p.o. twice daily as needed.  We discussed that she could take half before work, half around lunchtime, and then 1 p.o. a couple of hours before bedtime which may improve ruminations in the evening. Continue Vistaril 25 mg 1 every 6 hours as needed. Continue Nuvigil 250 mg p.o. every morning. Continue Inderal 20 mg 1 3 times daily as needed anxiety Continue metformin ER 1000 mg daily Continue B12 500 mcg daily Continue psychotherapy with [name], LPC Return in 4 weeks or sooner as needed.  Donnal Moat, PA-C

## 2018-09-13 ENCOUNTER — Encounter: Payer: Self-pay | Admitting: Pediatrics

## 2018-09-13 ENCOUNTER — Telehealth: Payer: Self-pay | Admitting: Physician Assistant

## 2018-09-13 ENCOUNTER — Ambulatory Visit: Payer: 59 | Admitting: Pediatrics

## 2018-09-13 VITALS — BP 110/73 | HR 104 | Temp 99.4°F | Resp 18 | Ht 64.0 in | Wt 219.0 lb

## 2018-09-13 DIAGNOSIS — J069 Acute upper respiratory infection, unspecified: Secondary | ICD-10-CM | POA: Diagnosis not present

## 2018-09-13 NOTE — Telephone Encounter (Signed)
Please advise 

## 2018-09-13 NOTE — Progress Notes (Addendum)
  Subjective:   Patient ID: Paula Elliott, female    DOB: December 28, 1990, 27 y.o.   MRN: [id number] CC: Cough; Nasal Congestion; and Chest congestion  HPI: Paula Elliott is a 27 y.o. female   Symptoms started 2 days ago with nasal congestion, cough. Throat sore from coughing, doesn't hurt when swallows. No fevers. Appetite slightly down. Smoking daily not interested in quitting. Has been on inhalers in the past for cough, has not helped. She doesn't have any inhalers at home. Steroids have increased appetite, made her feel bad in the past, wants to avoid.  Relevant past medical, surgical, family and social history reviewed. Allergies and medications reviewed and updated. Social History   Tobacco Use  Smoking Status Current Every Day Smoker  Smokeless Tobacco Never Used   ROS: Per HPI   Objective:    BP 110/73   Pulse (!) 104   Temp 99.4 F (37.4 C) (Oral)   Resp 18   Ht 5\' 4"  (1.626 m)   Wt 219 lb (99.3 kg)   SpO2 98%   BMI 37.59 kg/m   Wt Readings from Last 3 Encounters:  09/13/18 219 lb (99.3 kg)  06/26/18 214 lb (97.1 kg)  12/09/17 225 lb (102.1 kg)    Gen: NAD, alert, cooperative with exam, NCAT, +sinus congestion EYES: EOMI, no conjunctival injection, or no icterus ENT:  TMs pearly gray b/l, OP without erythema LYMPH: no cervical LAD CV: NRRR, normal S1/S2, II/VI mid systolic murmur, distal pulses 2+ b/l Resp: CTABL, no wheezes or rales, normal WOB Abd: +BS, soft, NTND. no guarding or organomegaly Ext: No edema, warm Neuro: Alert and oriented MSK: normal muscle bulk  Assessment & Plan:  Paula Elliott was seen today for cough, nasal congestion and chest congestion.  Diagnoses and all orders for this visit:  Acute URI Symptomatic care and return precautions discussed. Smoking cessation encouraged, pt says she's not ready yet.  Follow up plan: Return if symptoms worsen or fail to improve. Assunta Found, MD [place]

## 2018-09-13 NOTE — Patient Instructions (Signed)
Fever reducer and headache: tylenol and ibuprofen, can take together or alternating   Sinus pressure:  Nasal steroid such as flonase/fluticaone or nasocort daily Can also take daily antihistamine such as loratadine/claritin or cetirizine/zyrtec  Sinus rinses/irritation: Netipot or similar with distilled water 2-3 times a day to clear out sinuses or Normal saline nasal spray  Sore throat:  Throat lozenges chloroseptic spray  Stick with bland foods Drink lots of fluids  

## 2018-09-14 ENCOUNTER — Other Ambulatory Visit: Payer: Self-pay | Admitting: Physician Assistant

## 2018-09-14 MED ORDER — HYDROCODONE-HOMATROPINE 5-1.5 MG/5ML PO SYRP: 5.0000 mL | ORAL_SOLUTION | Freq: Four times a day (QID) | ORAL | 0 refills | Status: DC | PRN

## 2018-09-14 NOTE — Telephone Encounter (Signed)
sent 

## 2018-09-14 NOTE — Telephone Encounter (Signed)
Patient mother aware. 

## 2018-09-28 ENCOUNTER — Ambulatory Visit (INDEPENDENT_AMBULATORY_CARE_PROVIDER_SITE_OTHER): Payer: 59 | Admitting: Mental Health

## 2018-09-28 DIAGNOSIS — F319 Bipolar disorder, unspecified: Secondary | ICD-10-CM | POA: Diagnosis not present

## 2018-09-28 NOTE — Progress Notes (Signed)
      [facility] Counselor/Therapist Progress Note   Patient ID: Paula Elliott, MRN: [id number]  Date: 09/28/2018  Timespent: 53 minutes   Treatment Type: Individual  Reported Symptoms: Depressed mood most days, anxiety, rumination, hx of mood swings, crying spells   Mental Status Exam:    Appearance:   Casual   Behavior:  Nervous, fidgety  Motor:  WNL  Speech/Language:   Clear and Coherent  Affect:  constricted  Mood:  anxious  Thought process:  normal  Thought content:   WNL  Sensory/Perceptual disturbances:   WNL  Orientation:  oriented to person, place and time/date  Attention:  Good  Concentration:  Good  Memory:  WNL  Fund of knowledge:   Good  Insight:   Good  Judgment:   Good  Impulse Control:  Good     Risk Assessment: Danger to Self: No Self-injurious Behavior: No Danger to Others: No Duty to Warn:no Physical Aggression / Violence:No  Access to Firearms a concern: No  Gang Involvement:No    Interventions: Cognitive Behavioral Therapy, Ego-Supportive and Insight-Oriented   Subjective:   Pt shared how she was "blocked" by a friend she has known for years. She stated this person has often not been a true support and was often judgmental.  She shared how she continues to feel "abandoned" by friends and some supports.  She provided more history related to friendships, family throughout most of the session.  Assisted her in identifying strengths assisting her also and reframing some negative self talk present throughout the session.  Continue to work with her from a supportive, cognitive behavioral framework.  Encouraged her to identify strengths between sessions.   Interventions: Cognitive Behavioral Therapy   Diagnosis:   ICD-10-CM   1. Bipolar I disorder ([place]) F31.9      Plan:  1.  Patient to continue to engage in individual counseling 2-4 times a month or as needed. 2.  Patient to identify and apply  CBT, coping skills learned in session to decrease depression and anxiety symptoms. 3.  Patient to contact this office, go to the local ED or call 911 if a crisis or emergency develops between visits.   Anson [place], [facility]

## 2018-10-04 ENCOUNTER — Ambulatory Visit: Payer: 59 | Admitting: Mental Health

## 2018-10-04 DIAGNOSIS — F3181 Bipolar II disorder: Secondary | ICD-10-CM | POA: Diagnosis not present

## 2018-10-04 NOTE — Progress Notes (Signed)
      [facility] Counselor/Therapist Progress Note   Patient ID: Paula Elliott, MRN: [id number]  Date: 10/04/2018  Timespent: 53 minutes  Treatment Type: Individual  Reported Symptoms: Depressed mood most days, anxiety, rumination, hx of mood swings, crying spells   Mental Status Exam:    Appearance:   Casual   Behavior:  Nervous, fidgety  Motor:  WNL  Speech/Language:   Clear and Coherent  Affect:  constricted  Mood:  Anxious, depressed  Thought process:  normal  Thought content:   WNL, denies SI/HI  Sensory/Perceptual disturbances:   none  Orientation:  oriented to person, place and time/date  Attention:  Good  Concentration:  Good  Memory:  WNL  Fund of knowledge:   Good  Insight:   Good  Judgment:   Good  Impulse Control:  Good    Risk Assessment: Danger to Self: No Self-injurious Behavior: No Danger to Others: No Duty to Warn:no Physical Aggression / Violence:No  Access to Firearms a concern: No  Gang Involvement:No  Patient / guardian was educated about steps to take if suicide or homicide risk level increases between visits. While future psychiatric events cannot be accurately predicted, the patient does not currently require acute inpatient psychiatric care and does not currently meet [facility] involuntary commitment criteria.  Interventions: Cognitive Behavioral Therapy, Ego-Supportive and Insight-Oriented  Subjective:   Pt shared how she was more depressed over the past weekend. She did not spend time w/ any friends although some tried to reach out to her. She went on to state how she does have a work-friend but they are not close outside of work.  She shared how she continues to feel abandoned by many of her friends over the years and how it is been a pattern.  Patient did identify how she has some friends that she continues to talk to however.  She continues to minimize some of the support that she does have due  to her ongoing feelings of sadness and low self-esteem.  Challenged her to identify some strengths during the session, she struggled with this task.  Encouraged her to continue between sessions as well as practicing diaphragmatic breathing exercises which was discussed in detail the session.  Interventions: Cognitive Behavioral Therapy  Diagnosis:   ICD-10-CM   1. Bipolar II disorder ([place]) F31.81      Plan:  1.  Patient to continue to engage in individual counseling 2-4 times a month or as needed. 2.  Patient to identify and apply CBT, coping skills learned in session to decrease depression and anxiety symptoms. 3.  Patient to contact this office, go to the local ED or call 911 if a crisis or emergency develops between visits.   Anson [place], [facility]

## 2018-10-10 ENCOUNTER — Ambulatory Visit (INDEPENDENT_AMBULATORY_CARE_PROVIDER_SITE_OTHER): Payer: 59 | Admitting: Mental Health

## 2018-10-10 DIAGNOSIS — F3181 Bipolar II disorder: Secondary | ICD-10-CM
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
## 2018-10-17 ENCOUNTER — Ambulatory Visit: Payer: 59 | Admitting: Mental Health

## 2018-10-17 DIAGNOSIS — F3181 Bipolar II disorder: Secondary | ICD-10-CM | POA: Diagnosis not present

## 2018-10-17 NOTE — Progress Notes (Signed)
      [facility] Counselor/Therapist Progress Note   Patient ID: Paula Elliott, MRN: [id number]  Date: 10/17/2018  Timespent: 53 minutes  Treatment Type: Individual  Reported Symptoms: Depressed mood most days, anxiety, rumination, hx of mood swings, crying spells   Mental Status Exam:    Appearance:   Casual   Behavior:  Nervous, fidgety  Motor:  WNL  Speech/Language:   Clear and Coherent  Affect:  constricted  Mood:  Anxious, depressed  Thought process:  normal  Thought content:   WNL, denies SI/HI  Sensory/Perceptual disturbances:   none  Orientation:  oriented to person, place and time/date  Attention:  Good  Concentration:  Good  Memory:  WNL  Fund of knowledge:   Good  Insight:   Good  Judgment:   Good  Impulse Control:  Good    Risk Assessment: Danger to Self: No Self-injurious Behavior: No Danger to Others: No Duty to Warn:no Physical Aggression / Violence:No  Access to Firearms a concern: No  Gang Involvement:No  Patient / guardian was educated about steps to take if suicide or homicide risk level increases between visits. While future psychiatric events cannot be accurately predicted, the patient does not currently require acute inpatient psychiatric care and does not currently meet [facility] involuntary commitment criteria.  Interventions: Cognitive Behavioral Therapy, Ego-Supportive and Insight-Oriented  Subjective:   Patient arrived on time for today's session.  Discussed progress.  She stated that she continues to feel depressed.  She states she spoke with her mother recently about her self-injurious behavior.  She stated that her mother was upset but supportive.  Patient stated that she rub-burned her arm, showing marked in session.  She stated that she continues to struggle at times with this behavior.  We discussed coping strategies from last session as well as continue to explore with patient.  Continue  to work with patient from a strength-based approach.  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  Assisted her in challenging some negative self talk in session.  She stated that she struggled to identify self strengths between sessions as given his homework.  We discussed diaphragmatic breathing, mindfulness and cognitive strategies throughout the session to employ between sessions.  Interventions: Cognitive Behavioral Therapy  Diagnosis:   ICD-10-CM   1. Bipolar II disorder ([place]) F31.81      Plan:  1.  Patient to continue to engage in individual counseling 2-4 times a month or as needed. 2.  Patient to identify and apply CBT, coping skills learned in session to decrease depression and anxiety symptoms. 3.  Patient to contact this office, go to the local ED or call 911 if a crisis or emergency develops between visits.   Anson [place], [facility]

## 2018-10-23 ENCOUNTER — Ambulatory Visit: Payer: 59 | Admitting: Physician Assistant

## 2018-10-24 ENCOUNTER — Ambulatory Visit: Payer: 59 | Admitting: Mental Health

## 2018-10-24 DIAGNOSIS — F3181 Bipolar II disorder: Secondary | ICD-10-CM

## 2018-10-24 NOTE — Progress Notes (Signed)
      [facility] Counselor/Therapist Progress Note   Patient ID: Paula Elliott, MRN: [id number]  Date: 10/24/2018  Timespent: 53 minutes  Treatment Type: Individual  Reported Symptoms: Depressed mood most days, anxiety, rumination, hx of mood swings, crying spells  Mental Status Exam:    Appearance:   Casual   Behavior:  Nervous, fidgety  Motor:  WNL  Speech/Language:   Clear and Coherent  Affect:  constricted  Mood:  Anxious, depressed  Thought process:  normal  Thought content:   WNL, denies SI/HI  Sensory/Perceptual disturbances:   none  Orientation:  oriented to person, place and time/date  Attention:  Good  Concentration:  Good  Memory:  WNL  Fund of knowledge:   Good  Insight:   Good  Judgment:   Good  Impulse Control:  Good    Risk Assessment: Danger to Self: No Self-injurious Behavior: No Danger to Others: No Duty to Warn:no Physical Aggression / Violence:No  Access to Firearms a concern: No  Gang Involvement:No  Patient / guardian was educated about steps to take if suicide or homicide risk level increases between visits. While future psychiatric events cannot be accurately predicted, the patient does not currently require acute inpatient psychiatric care and does not currently meet [facility] involuntary commitment criteria.   Subjective:   Patient arrived on time for today's session.  Discussed progress.  Reviewed some content from previous session related to her self-injurious behaviors and negative self talk.  She stated that she has not engaged in any self-injurious behaviors, however she did get a tattoo recently, indicating that this was somewhat of a replacement.  The tattoo was a picture of her mother and grandmother taken from a photo.  She shared how she attempted to go to the music concert recently, however met with her but began to have a panic attack, so they had to leave.  Patient shared how she spoke  to her mother about some of her recent self injury behaviors and how she tries to be supportive overall.  Patient identified today her continued struggle for self worth.  She continues to heal from relate recent friendship that had dissolved, patient continuing to blame herself although identifying how this past friend was often critical of her.  Provided resource to patient to review between sessions to assist with decreasing her negative self talk.  Interventions: Cognitive Behavioral Therapy  Diagnosis:   ICD-10-CM   1. Bipolar II disorder ([place]) F31.81      Plan:  1.  Patient to continue to engage in individual counseling 2-4 times a month or as needed. 2.  Patient to identify and apply CBT, coping skills learned in session to decrease depression and anxiety symptoms. 3.  Patient to contact this office, go to the local ED or call 911 if a crisis or emergency develops between visits.   Anson [place], [facility]

## 2018-11-14 ENCOUNTER — Ambulatory Visit: Payer: 59 | Admitting: Mental Health

## 2018-11-20 ENCOUNTER — Ambulatory Visit: Payer: 59 | Admitting: Mental Health

## 2018-11-20 DIAGNOSIS — F3181 Bipolar II disorder: Secondary | ICD-10-CM | POA: Diagnosis not present

## 2018-11-23 ENCOUNTER — Other Ambulatory Visit: Payer: Self-pay | Admitting: Physician Assistant

## 2018-12-01 ENCOUNTER — Ambulatory Visit: Payer: 59 | Admitting: Physician Assistant

## 2018-12-06 ENCOUNTER — Ambulatory Visit (INDEPENDENT_AMBULATORY_CARE_PROVIDER_SITE_OTHER): Payer: 59 | Admitting: Mental Health

## 2018-12-06 DIAGNOSIS — F3181 Bipolar II disorder: Secondary | ICD-10-CM | POA: Diagnosis not present

## 2018-12-07 NOTE — Progress Notes (Signed)
      [facility] Counselor/Therapist Progress Note   Patient ID: Paula Elliott, MRN: [id number]  Date: 12/07/2018  Timespent: 53 minutes  Treatment Type: Individual  Reported Symptoms: Depressed mood most days, anxiety, rumination, hx of mood swings, crying spells  Mental Status Exam:    Appearance:   Casual   Behavior:  Nervous, fidgety  Motor:  WNL  Speech/Language:   Clear and Coherent  Affect:  constricted  Mood:  Anxious, depressed  Thought process:  normal  Thought content:   WNL, denies SI/HI  Sensory/Perceptual disturbances:   none  Orientation:  oriented to person, place and time/date  Attention:  Good  Concentration:  Good  Memory:  WNL  Fund of knowledge:   Good  Insight:   Good  Judgment:   Good  Impulse Control:  Good    Risk Assessment: Danger to Self: No Self-injurious Behavior: No Danger to Others: No Duty to Warn:no Physical Aggression / Violence:No  Access to Firearms a concern: No  Gang Involvement:No  Patient / guardian was educated about steps to take if suicide or homicide risk level increases between visits. While future psychiatric events cannot be accurately predicted, the patient does not currently require acute inpatient psychiatric care and does not currently meet [facility] involuntary commitment criteria.   Subjective:   Patient arrived on time for today's session.  Discussed progress and events since last visit.  She shared how she has had some work-related stress, going on to share how she feels she is being treated unfairly at times by her supervisor, giving examples.  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  She is hopeful as she plans to take the test again in approximately 2 weeks.  She continues to reside at home with her parents.  She verbalized feelings of depression and anxiety that persist with a recent incident of self injury.  Patient  shared how she burned herself with a lighter, showing no marks on her left upper arm area.  She denied that the injury broke the skin and it did not appear so.  We explored alternative ways of coping along with assisted her in processing feelings related.  She identified "always being there for others", and also feeling a low degree of self-worth.  Assisted her in identifying self strengths and encouraged her to continue between sessions. Interventions: Cognitive Behavioral Therapy , supportive therapy, strength based approaches Diagnosis:   ICD-10-CM   1. Bipolar II disorder ([place]) F31.81      Plan:  1.  Patient to continue to engage in individual counseling 2-4 times a month or as needed. 2.  Patient to identify and apply CBT, coping skills learned in session to decrease depression and anxiety symptoms. 3.  Patient to contact this office, go to the local ED or call 911 if a crisis or emergency develops between visits.   Anson [place], [facility]

## 2018-12-11 ENCOUNTER — Ambulatory Visit: Payer: 59 | Admitting: Mental Health

## 2018-12-13 ENCOUNTER — Ambulatory Visit (INDEPENDENT_AMBULATORY_CARE_PROVIDER_SITE_OTHER): Payer: 59 | Admitting: Mental Health

## 2018-12-13 DIAGNOSIS — F3181 Bipolar II disorder: Secondary | ICD-10-CM | POA: Diagnosis not present

## 2018-12-18 NOTE — Progress Notes (Signed)
      [facility] Counselor/Therapist Progress Note   Patient ID: Paula Elliott, MRN: [id number]  Date: 12/06/18  Timespent: 55 minutes  Treatment Type: Individual  Reported Symptoms: Depressed mood most days, anxiety, rumination, hx of mood swings, crying spells  Mental Status Exam:    Appearance:   Casual   Behavior:  Nervous, fidgety  Motor:  WNL  Speech/Language:   Clear and Coherent  Affect:  constricted  Mood:  Anxious, depressed  Thought process:  normal  Thought content:   WNL, denies SI/HI  Sensory/Perceptual disturbances:   none  Orientation:  oriented to person, place and time/date  Attention:  Good  Concentration:  Good  Memory:  WNL  Fund of knowledge:   Good  Insight:   developing  Judgment:   developing  Impulse Control:  developing    Risk Assessment: Danger to Self: No Self-injurious Behavior: No Danger to Others: No Duty to Warn:no Physical Aggression / Violence:No  Access to Firearms a concern: No  Gang Involvement:No  Patient / guardian was educated about steps to take if suicide or homicide risk level increases between visits. While future psychiatric events cannot be accurately predicted, the patient does not currently require acute inpatient psychiatric care and does not currently meet [facility] involuntary commitment criteria.   Subjective:   Patient arrived on time for today's session.  Discussed progress and events since last visit.  She shared stressors at work, continues to struggle to pass an examination to increase her job Land and longevity.  Relationships and interactions were shared from work and how she is coping with some specific challenges.  Provided support throughout, reminding patient of making the efforts toward separating negative experiences at work with internal negative or critical self talk.  She shared family interactions continuing to have support from her parents.  She shared  more specifics related to relationship but she has with her mother and father.  We discussed alternative ways of coping with stress to avoid any self-injurious behaviors.  She continues to struggle at times with self burning.  She denied needing any immediate medical attention and that her injury remained superficial.  We reviewed coping strategies.  Continue to work with patient from a strength-based cognitive behavioral approach.   Interventions: Cognitive Behavioral Therapy , supportive therapy, strength based approaches  Diagnosis:   ICD-10-CM   1. Bipolar II disorder ([place]) F31.81      Plan:  1.  Patient to continue to engage in individual counseling 2-4 times a month or as needed. 2.  Patient to identify and apply CBT, coping skills learned in session to decrease depression and anxiety symptoms. 3.  Patient to contact this office, go to the local ED or call 911 if a crisis or emergency develops between visits.   Anson [place], [facility]

## 2018-12-20 ENCOUNTER — Ambulatory Visit (INDEPENDENT_AMBULATORY_CARE_PROVIDER_SITE_OTHER): Payer: 59 | Admitting: Mental Health

## 2018-12-20 DIAGNOSIS — F3181 Bipolar II disorder: Secondary | ICD-10-CM | POA: Diagnosis not present

## 2018-12-21 ENCOUNTER — Telehealth: Payer: Self-pay | Admitting: Physician Assistant

## 2018-12-22 NOTE — Progress Notes (Signed)
      [facility] Counselor/Therapist Progress Note   Patient ID: Marleigh Kaylor, MRN: [id number]  Date: 12/06/18  Timespent: 55 minutes  Treatment Type: Individual  Reported Symptoms: Depressed mood most days, anxiety, rumination, hx of mood swings, crying spells  Mental Status Exam:    Appearance:   Casual   Behavior:  Nervous, fidgety  Motor:  WNL  Speech/Language:   Clear and Coherent  Affect:  constricted  Mood:  Anxious, depressed  Thought process:  normal  Thought content:   WNL, denies SI/HI  Sensory/Perceptual disturbances:   none  Orientation:  oriented to person, place and time/date  Attention:  Good  Concentration:  Good  Memory:  WNL  Fund of knowledge:   Good  Insight:   developing  Judgment:   developing  Impulse Control:  developing    Risk Assessment: Danger to Self: No Self-injurious Behavior: No Danger to Others: No Duty to Warn:no Physical Aggression / Violence:No  Access to Firearms a concern: No  Gang Involvement:No  Patient / guardian was educated about steps to take if suicide or homicide risk level increases between visits. While future psychiatric events cannot be accurately predicted, the patient does not currently require acute inpatient psychiatric care and does not currently meet [facility] involuntary commitment criteria.   Subjective:   Patient arrived on time for today's session.  Discussed progress and events since last visit.  She continues to have work-related stress.  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  She has considered looking for other jobs.  She shared the emotional toll that has taken on her of not succeeding with the testing and subsequent relationship stressors at work, namely with her supervisor where patient feels she is treated unfairly when compared to other employees.  Continue to work with patient from a strength-based approach assisting her  in identifying how she has thrived and how ultimately, she has time to consider other job possibilities.  Encouraged her to recognize the difference between our job career and are value as a person.  Patient was able to identify some self strengths but struggled also, projecting herself in a negative light at times.  Encouraged her to continue to care for herself by being mindful of her negative self talk and to journal feelings as needed.  Encouraged her to engage in activities with some increase consistency to improve motivation and purpose.  Interventions: Cognitive Behavioral Therapy , supportive therapy, strength based approaches  Diagnosis:   ICD-10-CM   1. Bipolar II disorder ([place]) F31.81      Plan:  1.  Patient to continue to engage in individual counseling 2-4 times a month or as needed. 2.  Patient to identify and apply CBT, coping skills learned in session to decrease depression and anxiety symptoms. 3.  Patient to contact this office, go to the local ED or call 911 if a crisis or emergency develops between visits.   Anson [place], [facility]

## 2018-12-25 ENCOUNTER — Other Ambulatory Visit: Payer: Self-pay | Admitting: Physician Assistant

## 2018-12-25 NOTE — Telephone Encounter (Signed)
Has ov 12/27/2018, not out of medications

## 2018-12-26 NOTE — Progress Notes (Signed)
      [facility] Counselor/Therapist Progress Note   Patient ID: Paula Elliott, MRN: [id number]  Date: 12/13/18  Timespent: 61 minutes  Treatment Type: Individual  Reported Symptoms: Depressed mood most days, anxiety, rumination, hx of mood swings, crying spells  Mental Status Exam:    Appearance:   Casual   Behavior:  Nervous, fidgety  Motor:  WNL  Speech/Language:   Clear and Coherent  Affect:  constricted  Mood:  Anxious, depressed  Thought process:  normal  Thought content:   WNL, denies SI/HI  Sensory/Perceptual disturbances:   none  Orientation:  oriented to person, place and time/date  Attention:  Good  Concentration:  Good  Memory:  WNL  Fund of knowledge:   Good  Insight:   developing  Judgment:   developing  Impulse Control:  developing    Risk Assessment: Danger to Self: No Self-injurious Behavior: No Danger to Others: No Duty to Warn:no Physical Aggression / Violence:No  Access to Firearms a concern: No  Gang Involvement:No  Patient / guardian was educated about steps to take if suicide or homicide risk level increases between visits. While future psychiatric events cannot be accurately predicted, the patient does not currently require acute inpatient psychiatric care and does not currently meet [facility] involuntary commitment criteria.   Subjective:   Patient arrived on time for today's session.  Discussed progress and events since last visit.  She continued to share work-related stress, going on to share some relationship struggles at work mainly with her supervisor.  Patient expressed doubt about her ability to maintain the job due to her failing the test multiple times.  Patient was encouraged to allow herself to look at all of her options and remind herself that she has support from her parents as she is concerned about finances.  She stated they remain highly supportive.  Continue to work with patient from a  strength-based approach assisted her in identifying strengths and her ability to persevere through various situations discussed today.  Reviewed diaphragmatic breathing to get to continue between sessions.  Interventions: Cognitive Behavioral Therapy , supportive therapy, strength based approaches  Diagnosis:   ICD-10-CM   1. Bipolar II disorder ([place]) F31.81      Plan:  1.  Patient to continue to engage in individual counseling 2-4 times a month or as needed. 2.  Patient to identify and apply CBT, coping skills learned in session to decrease depression and anxiety symptoms. 3.  Patient to contact this office, go to the local ED or call 911 if a crisis or emergency develops between visits.   Anson [place], [facility]

## 2018-12-27 ENCOUNTER — Ambulatory Visit (INDEPENDENT_AMBULATORY_CARE_PROVIDER_SITE_OTHER): Payer: 59 | Admitting: Physician Assistant

## 2018-12-27 ENCOUNTER — Ambulatory Visit: Payer: 59 | Admitting: Mental Health

## 2018-12-27 DIAGNOSIS — F411 Generalized anxiety disorder: Secondary | ICD-10-CM

## 2018-12-27 DIAGNOSIS — F3181 Bipolar II disorder: Secondary | ICD-10-CM | POA: Diagnosis not present

## 2018-12-27 DIAGNOSIS — G4700 Insomnia, unspecified: Secondary | ICD-10-CM

## 2018-12-27 DIAGNOSIS — G2581 Restless legs syndrome: Secondary | ICD-10-CM

## 2018-12-27 MED ORDER — GABAPENTIN 300 MG PO CAPS: 300.0000 mg | ORAL_CAPSULE | Freq: Two times a day (BID) | ORAL | 1 refills | Status: DC

## 2018-12-27 MED ORDER — DULOXETINE HCL 30 MG PO CPEP: 90.0000 mg | ORAL_CAPSULE | Freq: Every day | ORAL | 1 refills | Status: DC

## 2018-12-27 NOTE — Progress Notes (Signed)
      [facility] Counselor/Therapist Progress Note   Patient ID: Paula Elliott, MRN: [id number]  Date: 12/27/18  Timespent: 62 minutes  Treatment Type: Individual  Reported Symptoms: Depressed mood most days, anxiety, rumination, hx of mood swings, crying spells  Mental Status Exam:    Appearance:   Casual   Behavior:  Nervous, fidgety  Motor:  WNL  Speech/Language:   Clear and Coherent  Affect:  constricted  Mood:  Anxious, depressed  Thought process:  normal  Thought content:   WNL, denies SI/HI  Sensory/Perceptual disturbances:   none  Orientation:  oriented to person, place and time/date  Attention:  Good  Concentration:  Good  Memory:  WNL  Fund of knowledge:   Good  Insight:   developing  Judgment:   developing  Impulse Control:  developing    Risk Assessment: Danger to Self: No Self-injurious Behavior: No Danger to Others: No Duty to Warn:no Physical Aggression / Violence:No  Access to Firearms a concern: No  Gang Involvement:No  Patient / guardian was educated about steps to take if suicide or homicide risk level increases between visits. While future psychiatric events cannot be accurately predicted, the patient does not currently require acute inpatient psychiatric care and does not currently meet [facility] involuntary commitment criteria.   Subjective:   Patient arrived on time for today's session.  Patient shared recent stressors, going on to discuss work issues and how she remains on probation.  She continues to look for other employment, however has no definitive leads on new jobs at this time.  She continues to live at home with her parents who are supportive.  She shared how family continues to grieve the loss of her aunt which was approximately 5 years ago.  She shared more information related to her family, how her mother is supportive of her, inquiring about where she is or her safety at times excessively.   Patient stated that her mother however, tends to focus on her life stressors often and patient expressed not feeling the support at times due to this behavior.  Patient continues to struggle with managing stressors related to self injury.  She continues to engage in some self-injurious behaviors which through discussion were identified as superficial and not requiring emergent medical care.  Patient shared her long history of self injury going back into early adolescence.  Continue to work with patient from a strength-based cognitive behavioral framework.  She expressed feelings of low self-worth, her struggles to believe she is worthy of being in some relationships.  Encouraged her to continue journaling between sessions providing support.  Encouraged her to maintain journaling around some self strengths.  Interventions: Cognitive Behavioral Therapy , supportive therapy, strength based approaches  Diagnosis:   ICD-10-CM   1. Bipolar II disorder ([place]) F31.81      Plan:  1.  Patient to continue to engage in individual counseling 2-4 times a month or as needed. 2.  Patient to identify and apply CBT, coping skills learned in session to decrease depression and anxiety symptoms. 3.  Patient to contact this office, go to the local ED or call 911 if a crisis or emergency develops between visits.   Anson [place], [facility]

## 2018-12-27 NOTE — Progress Notes (Signed)
[facility]  Patient ID: Paula Elliott,  MRN: [id number]  PCP: Terald Sleeper, PA-C  Date of Evaluation: 12/27/18 Time spent:25 minutes  Chief Complaint:  Chief Complaint    Anxiety; Depression      HISTORY/CURRENT STATUS: HPI  Not doing well.  For a few months, hasn't been doing well.  More anxiety with PA a couple of days a week.  Has had CP and even been to her PCP for that. EKG was nl. Was told that it was anxiety.    Having more depression and started cutting and burning herself again a few times, in past 6 weeks or so.  There's no speciific trigger.  Wants to sleep a lot and will sleep the weekend away.  Went into work late on Monday b/c she didn't feel like could make it.  Work is very stressful.  "They put so much stress on me. They wrote me up for something and now I'm on probation.  Now I'm having to look for a new job. I'm miserable in every aspect." Having passive SI.  No plan.  Doesn't want to be here.  Valium isn't helping the anxiety.  Feels her chest is tight, like something is sitting on her chest.    She does try to socialize if someone asks her to.  And has been going to the gym but it doesn't help at all.  Energy and motivation are low. Also having trouble remembering things.  Patient denies increased energy with decreased need for sleep, no increased talkativeness, no racing thoughts, no impulsivity or risky behaviors, no increased spending, no increased libido, no grandiosity.  Has tremor in hands bilat that comes and goes.  Sometimes can't write clearly.  She's tried the Inderal without improvement.  Doesn't drink caffeine now.  Also reports a jittery feeling in her legs bilaterally that starts in the evening.  Sometimes it prevents her from going to sleep quickly.  Individual Medical History/ Review of Systems: Changes? :No    Past medications for mental health diagnoses include: Wellbutrin, Xanax, Ativan, Provigil, Latuda, Lamictal,  Abilify, Prozac, Luvox   Allergies: Patient has no known allergies.  Current Medications:  Current Outpatient Medications:  .  Armodafinil 250 MG tablet, Take 1 tablet (250 mg total) by mouth every morning., Disp: 30 tablet, Rfl: 2 .  diazepam (VALIUM) 5 MG tablet, Take 1 tablet (5 mg total) by mouth every 12 (twelve) hours as needed for anxiety., Disp: 60 tablet, Rfl: 2 .  hydrOXYzine (ATARAX/VISTARIL) 25 MG tablet, Take 1 tablet (25 mg total) by mouth every 8 (eight) hours as needed., Disp: 90 tablet, Rfl: 2 .  metFORMIN (GLUCOPHAGE) 1000 MG tablet, Take 1 tablet (1,000 mg total) by mouth daily. with food, Disp: 30 tablet, Rfl: 2 .  minocycline (MINOCIN,DYNACIN) 50 MG capsule, Take by mouth daily., Disp: , Rfl: 3 .  QUEtiapine (SEROQUEL) 300 MG tablet, TAKE TWO TABLETS BY MOUTH AT BEDTIME, Disp: 60 tablet, Rfl: 0 .  traZODone (DESYREL) 50 MG tablet, Take 1 tablet (50 mg total) by mouth at bedtime., Disp: 30 tablet, Rfl: 1 .  vitamin B-12 (CYANOCOBALAMIN) 500 MCG tablet, Take 1 tablet (500 mcg total) by mouth daily., Disp: 30 tablet, Rfl: 2 .  DULoxetine (CYMBALTA) 30 MG capsule, Take 3 capsules (90 mg total) by mouth daily., Disp: 90 capsule, Rfl: 1 .  gabapentin (NEURONTIN) 300 MG capsule, Take 1 capsule (300 mg total) by mouth 2 (two) times daily. Take 1 qhs for 3 days,  then 1 bid., Disp: 60 capsule, Rfl: 1 .  HYDROcodone-homatropine (HYCODAN) 5-1.5 MG/5ML syrup, Take 5-10 mLs by mouth every 6 (six) hours as needed. (Patient not taking: Reported on 12/27/2018), Disp: 240 mL, Rfl: 0   Medication Side Effects: tremor   Family Medical/ Social History: Changes? Yes on probation at job  MENTAL HEALTH EXAM:  There were no vitals taken for this visit.There is no height or weight on file to calculate BMI.  General Appearance: Casual, Well Groomed and Obese  Eye Contact:  Good  Speech:  Clear and Coherent  Volume:  Normal  Mood:  Depressed  Affect:  Depressed  Thought Process:  Goal Directed   Orientation:  Full (Time, Place, and Person)  Thought Content: Logical   Suicidal Thoughts:  Yes.  without intent/plan  Homicidal Thoughts:  No  Memory:  WNL on exam but she reports short term isn't good.  Judgement:  Good  Insight:  Good  Psychomotor Activity:  Tremor fine motor bilat hands  Concentration:  Concentration: Fair and Attention Span: Fair  Recall:  Good  Fund of Knowledge: Good  Language: Good  Assets:  Desire for Improvement  ADL's:  Intact  Cognition: WNL  Prognosis:  Good  Skin-numerous well-healed small, round and oval hyper-pigmented scars on dorsum of hands bilat.    DIAGNOSES:    ICD-10-CM   1. Bipolar II disorder ([place]) F31.81   2. Generalized anxiety disorder F41.1   3. Insomnia, unspecified type G47.00   4. Restless leg syndrome G25.81     Receiving Psychotherapy: Yes With [name], [facility]   RECOMMENDATIONS: I spent 25 minutes with her and 50% of that time was spent in counseling reference to different treatment options.  At this point, I think it is most important to treat the depression.  I will address the tremor in further detail at the next visit.  I do not want to do too many things at once.  She states she had that happen at a previous provider where every time she went for a med check, if she said she was not doing well, all of her medications were changed at once.  She does not know if anything was working or not, or if she had side effects what was causing it. Discussed safety plan at length with patient.  Verbal contract for safety in place. Advised patient to contact office, call the suicide hotline, 911, or go directly to [facility] emergency room for evaluation if suicidal thoughts worsen. Increase Cymbalta to 90mg . D/C Inderal.  It has not been helping the anxiety or the tremor. Start Gabapentin 300mg  qhs for 3 nights then bid.  For anxiety and mild restless leg. Continue armodafinil 250 mg every morning. Continue Valium 5 mg every 12  hours as needed. Continue hydroxyzine 25 mg every 8 hours as needed. Continue Seroquel 600 mg nightly. Continue trazodone 50 mg nightly as needed. Continue psychotherapy with [name], LPC.  She has an appointment with him later today. Return in 4 weeks or sooner as needed.  Donnal Moat, PA-C

## 2018-12-28 ENCOUNTER — Encounter: Payer: Self-pay | Admitting: Physician Assistant

## 2019-01-03 ENCOUNTER — Ambulatory Visit: Payer: 59 | Admitting: Mental Health

## 2019-01-04 ENCOUNTER — Ambulatory Visit (INDEPENDENT_AMBULATORY_CARE_PROVIDER_SITE_OTHER): Payer: 59 | Admitting: Mental Health

## 2019-01-04 DIAGNOSIS — F3181 Bipolar II disorder: Secondary | ICD-10-CM

## 2019-01-10 ENCOUNTER — Ambulatory Visit (INDEPENDENT_AMBULATORY_CARE_PROVIDER_SITE_OTHER): Payer: 59 | Admitting: Mental Health

## 2019-01-10 DIAGNOSIS — F3181 Bipolar II disorder: Secondary | ICD-10-CM

## 2019-01-11 NOTE — Progress Notes (Signed)
      [facility] Counselor/Therapist Progress Note   Patient ID: Recia Sons, MRN: [id number]  Date: 01/11/19  Timespent: 65 minutes  Treatment Type: Individual  Reported Symptoms: Depressed mood most days, anxiety, rumination, hx of mood swings, crying spells  Mental Status Exam:    Appearance:   Casual   Behavior:  Nervous, fidgety  Motor:  WNL  Speech/Language:   Clear and Coherent  Affect:  constricted  Mood:  Anxious, depressed  Thought process:  normal  Thought content:   WNL, denies SI/HI  Sensory/Perceptual disturbances:   none  Orientation:  oriented to person, place and time/date  Attention:  Good  Concentration:  Good  Memory:  WNL  Fund of knowledge:   Good  Insight:   developing  Judgment:   developing  Impulse Control:  developing    Risk Assessment: Danger to Self: No Self-injurious Behavior: Yes (superficial) Danger to Others: No Duty to Warn:no Physical Aggression / Violence:No  Access to Firearms a concern: No  Gang Involvement:No  Patient / guardian was educated about steps to take if suicide or homicide risk level increases between visits. While future psychiatric events cannot be accurately predicted, the patient does not currently require acute inpatient psychiatric care and does not currently meet [facility] involuntary commitment criteria.   Subjective:   Patient arrived on time for today's session. Discussed recent events and issues, where she continued to focus primarily on work-related stress. She said that she is on probation due to being a new employee and continues to seek other employment and she is determined to leave her current job for increased satisfaction. Patient identified her strongest support system being the maternal side of the family along with her father. Review coping related to specifically decreasing occasional incidents related to self harm. Patient continues to struggle in  this area, shared her lengthy history of how this coping has changed over the years and her rationale for the behavior. Continue to provide support, understanding continuing to work with patient from a cognitive behavioral framework. Continued to reinforce homework related to coping and decreasing symptoms along with self-harm.  Interventions: Cognitive Behavioral Therapy , supportive therapy, strength based approaches  Diagnosis:   ICD-10-CM   1. Bipolar II disorder ([place]) F31.81      Plan:  1.  Patient to continue to engage in individual counseling 2-4 times a month or as needed. 2.  Patient to identify and apply CBT, coping skills learned in session to decrease depression and anxiety symptoms. 3.  Patient to contact this office, go to the local ED or call 911 if a crisis or emergency develops between visits.   Anson [place], [facility]

## 2019-01-16 ENCOUNTER — Ambulatory Visit: Payer: 59 | Admitting: Mental Health

## 2019-01-17 ENCOUNTER — Ambulatory Visit: Payer: 59 | Admitting: Mental Health

## 2019-01-17 DIAGNOSIS — F3181 Bipolar II disorder: Secondary | ICD-10-CM

## 2019-01-24 ENCOUNTER — Ambulatory Visit (INDEPENDENT_AMBULATORY_CARE_PROVIDER_SITE_OTHER): Payer: 59 | Admitting: Mental Health

## 2019-01-24 DIAGNOSIS — F3181 Bipolar II disorder: Secondary | ICD-10-CM | POA: Diagnosis not present

## 2019-01-24 NOTE — Progress Notes (Signed)
      [facility] Counselor/Therapist Progress Note   Patient ID: Paula Elliott, MRN: [id number]  Date: 01/04/19  Timespent: 60 minutes  Treatment Type: Individual  Reported Symptoms: Depressed mood most days, anxiety, rumination, hx of mood swings, crying spells  Mental Status Exam:    Appearance:   Casual   Behavior:  Nervous, fidgety  Motor:  WNL  Speech/Language:   Clear and Coherent  Affect:  constricted  Mood:  Anxious, depressed  Thought process:  normal  Thought content:   WNL, denies SI/HI  Sensory/Perceptual disturbances:   none  Orientation:  oriented to person, place and time/date  Attention:  Good  Concentration:  Good  Memory:  WNL  Fund of knowledge:   Good  Insight:   developing  Judgment:   developing  Impulse Control:  developing    Risk Assessment: Danger to Self: No Self-injurious Behavior: No Danger to Others: No Duty to Warn:no Physical Aggression / Violence:No  Access to Firearms a concern: No  Gang Involvement:No  Patient / guardian was educated about steps to take if suicide or homicide risk level increases between visits. While future psychiatric events cannot be accurately predicted, the patient does not currently require acute inpatient psychiatric care and does not currently meet [facility] involuntary commitment criteria.   Subjective:   Patient arrived on time for today's session.  Patient shared recent stressors, going on to discuss work issues and how she remains on probation.  She continues to look for other employment, however has no definitive leads on new jobs at this time.  She continues to live at home with her parents who are supportive.  She shared how family continues to grieve the loss of her aunt which was approximately 5 years ago.  She shared more information related to her family, how her mother is supportive of her, inquiring about where she is or her safety at times excessively.   Patient stated that her mother however, tends to focus on her life stressors often and patient expressed not feeling the support at times due to this behavior.  Patient continues to struggle with managing stressors related to self injury.  She continues to engage in some self-injurious behaviors which through discussion were identified as superficial and not requiring emergent medical care.  Patient shared her long history of self injury going back into early adolescence.  Continue to work with patient from a strength-based cognitive behavioral framework.  She expressed feelings of low self-worth, her struggles to believe she is worthy of being in some relationships.  Encouraged her to continue journaling between sessions providing support.  Encouraged her to maintain journaling around some self strengths.  Interventions: Cognitive Behavioral Therapy , supportive therapy, strength based approaches  Diagnosis:   ICD-10-CM   1. Bipolar II disorder ([place]) F31.81      Plan:  1.  Patient to continue to engage in individual counseling 2-4 times a month or as needed. 2.  Patient to identify and apply CBT, coping skills learned in session to decrease depression and anxiety symptoms. 3.  Patient to contact this office, go to the local ED or call 911 if a crisis or emergency develops between visits.   Anson [place], [facility]

## 2019-01-26 NOTE — Progress Notes (Signed)
      [facility] Counselor/Therapist Progress Note   Patient ID: Mitali Shenefield, MRN: [id number]  Date: 01/17/19  Timespent: 64 minutes  Treatment Type: Individual  Reported Symptoms: Depressed mood most days, anxiety, rumination, hx of mood swings, crying spells  Mental Status Exam:    Appearance:   Casual   Behavior:  Nervous, fidgety  Motor:  WNL  Speech/Language:   Clear and Coherent  Affect:  constricted  Mood:  Anxious, depressed  Thought process:  normal  Thought content:   WNL, denies SI/HI  Sensory/Perceptual disturbances:   none  Orientation:  oriented to person, place and time/date  Attention:  Good  Concentration:  Good  Memory:  WNL  Fund of knowledge:   Good  Insight:   developing  Judgment:   developing  Impulse Control:  developing    Risk Assessment: Danger to Self: No Self-injurious Behavior: Yes (superficial) Danger to Others: No Duty to Warn:no Physical Aggression / Violence:No  Access to Firearms a concern: No  Gang Involvement:No  Patient / guardian was educated about steps to take if suicide or homicide risk level increases between visits. While future psychiatric events cannot be accurately predicted, the patient does not currently require acute inpatient psychiatric care and does not currently meet [facility] involuntary commitment criteria.   Subjective:   Patient arrived on time for today's session. Discussed progress.  Patient shared work-related stress and her attempts to cope, communicate to advocate for her needs and her work environment as it remains stressful.  She shared how her mother has a surgery in the next few weeks relating to her knee.  Patient shared how she plans to care for her as well as her grandmother during her recovery.  Patient shared how she attempts at times to process feelings, particularly how her family focuses often on the loss of her aunt.  Patient identified feeling  "lost" and the family to an extent due to apparent complicated grief which is occurring in the family.  Patient stated her mother often provided support but ultimately their discussions lead back to herself, her stressors.  Patient identified how this and other instances with her family have impacted her sense of self and unmet needs.  Patient verbalized awareness of how her family is a component of her ongoing issues, while experiencing a sense of rejection from others.  Patient shared how she has never been in a romantic relationship and how this has reinforced her sense of lower self-esteem, self-importance and self-worth.  Interventions: Cognitive Behavioral Therapy , supportive therapy, strength based approaches  Diagnosis:   ICD-10-CM   1. Bipolar II disorder ([place]) F31.81      Plan:  1.  Patient to continue to engage in individual counseling 2-4 times a month or as needed. 2.  Patient to identify and apply CBT, coping skills learned in session to decrease depression and anxiety symptoms. 3.  Patient to contact this office, go to the local ED or call 911 if a crisis or emergency develops between visits.   Anson [place], [facility]

## 2019-01-31 ENCOUNTER — Ambulatory Visit (INDEPENDENT_AMBULATORY_CARE_PROVIDER_SITE_OTHER): Payer: 59 | Admitting: Mental Health

## 2019-01-31 DIAGNOSIS — F3181 Bipolar II disorder: Secondary | ICD-10-CM | POA: Diagnosis not present

## 2019-02-02 ENCOUNTER — Ambulatory Visit: Payer: 59 | Admitting: Physician Assistant

## 2019-02-02 ENCOUNTER — Encounter: Payer: Self-pay | Admitting: Physician Assistant

## 2019-02-02 DIAGNOSIS — F411 Generalized anxiety disorder: Secondary | ICD-10-CM | POA: Diagnosis not present

## 2019-02-02 DIAGNOSIS — R413 Other amnesia: Secondary | ICD-10-CM

## 2019-02-02 DIAGNOSIS — G2581 Restless legs syndrome: Secondary | ICD-10-CM | POA: Diagnosis not present

## 2019-02-02 DIAGNOSIS — F3181 Bipolar II disorder: Secondary | ICD-10-CM | POA: Diagnosis not present

## 2019-02-02 NOTE — Telephone Encounter (Signed)
Multiple attempts made to contact patient.  This encounter will now be closed  

## 2019-02-02 NOTE — Progress Notes (Signed)
[facility]  Patient ID: Paula Elliott,  MRN: [id number]  PCP: Terald Sleeper, PA-C  Date of Evaluation: 02/02/2019 Time spent:25 minutes  Chief Complaint:  Chief Complaint    Follow-up      HISTORY/CURRENT STATUS: HPI For routine med check.  Memory is horrible. Forgets people's names in 15 seconds.  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  She has sticky notes all over the place. Unsure when it got this bad.  This is been happening for at least 6 to 9 months.  Probably longer.  An example is she was trying to turn her windshield wipers on in her car but ended up turning off the lights.  Having more PA and heart rate goes up sometimes to 140 as per her Fitbit.  Needing the Valium a little more often past month.  It does help when she takes it.  She was never diagnosed with ADD or ADHD.  States she is an only child and it was never brought up by her teachers or her parents that anything was wrong.  Since adding the gabapentin about 6 weeks ago, she reports that the restless leg symptoms are some better.  She has had 2 different episodes where her legs were shaking and it woke her up but that is better than it was prior to going on the gabapentin.  States that her memory did not worsen with being put on the gabapentin.  Depressive symptoms are about the same.  She is able to work, although she is excited that she may be getting another job soon. Appetite has not changed.  No extreme sadness, tearfulness, or feelings of hopelessness.  Denies suicidal or homicidal thoughts.  Denies muscle or joint pain, stiffness, or dystonia.  Denies dizziness, syncope, seizures, numbness, tingling, tremor, tics, unsteady gait, slurred speech.  Patient states she is not confused but she just cannot remember things.  Individual Medical History/ Review of Systems: Changes? :No    Past medications for mental health diagnoses include: Wellbutrin, Xanax, Ativan, Provigil, Latuda,  Lamictal, Abilify, Prozac, Luvox  Allergies: Patient has no known allergies.  Current Medications:  Current Outpatient Medications:  .  Armodafinil 250 MG tablet, Take 1 tablet (250 mg total) by mouth every morning., Disp: 30 tablet, Rfl: 2 .  diazepam (VALIUM) 5 MG tablet, TAKE 1 TABLET BY MOUTH EVERY TWELVE HOURS AS NEEDED FOR ANXIETY, Disp: 60 tablet, Rfl: 2 .  DULoxetine (CYMBALTA) 30 MG capsule, Take 3 capsules (90 mg total) by mouth daily., Disp: 90 capsule, Rfl: 1 .  gabapentin (NEURONTIN) 300 MG capsule, Take 1 capsule (300 mg total) by mouth 2 (two) times daily. Take 1 qhs for 3 days, then 1 bid., Disp: 60 capsule, Rfl: 1 .  hydrOXYzine (ATARAX/VISTARIL) 25 MG tablet, Take 1 tablet (25 mg total) by mouth every 8 (eight) hours as needed., Disp: 90 tablet, Rfl: 2 .  metFORMIN (GLUCOPHAGE) 1000 MG tablet, Take 1 tablet (1,000 mg total) by mouth daily. with food, Disp: 30 tablet, Rfl: 2 .  minocycline (MINOCIN,DYNACIN) 50 MG capsule, Take by mouth daily., Disp: , Rfl: 3 .  QUEtiapine (SEROQUEL) 300 MG tablet, TAKE TWO TABLETS BY MOUTH AT BEDTIME, Disp: 60 tablet, Rfl: 2 .  traZODone (DESYREL) 50 MG tablet, Take 1 tablet (50 mg total) by mouth at bedtime., Disp: 30 tablet, Rfl: 1 .  vitamin B-12 (CYANOCOBALAMIN) 500 MCG tablet, Take 1 tablet (500 mcg total) by mouth daily., Disp: 30 tablet, Rfl: 2 .  HYDROcodone-homatropine (  HYCODAN) 5-1.5 MG/5ML syrup, Take 5-10 mLs by mouth every 6 (six) hours as needed. (Patient not taking: Reported on 12/27/2018), Disp: 240 mL, Rfl: 0 Medication Side Effects: Memory deficits?  Family Medical/ Social History: Changes? No  MENTAL HEALTH EXAM:  There were no vitals taken for this visit.There is no height or weight on file to calculate BMI.  General Appearance: Casual, Well Groomed and Obese  Eye Contact:  Good  Speech:  Clear and Coherent  Volume:  Normal  Mood:  Anxious  Affect:  Anxious  Thought Process:  Goal Directed  Orientation:  Full (Time,  Place, and Person)  Thought Content: Logical   Suicidal Thoughts:  No  Homicidal Thoughts:  No  Memory:  Immediate;   Good Recent;   Fair Remote;   Good  Judgement:  Good  Insight:  Good  Psychomotor Activity:  Normal  Concentration:  Concentration: Poor and Attention Span: Poor  Recall:  Poor  Fund of Knowledge: Good  Language: Good  Assets:  Desire for Improvement  ADL's:  Intact  Cognition: WNL  Prognosis:  Good  See MMSE on the chart.  She scored 30  DIAGNOSES:    ICD-10-CM   1. Memory deficit R41.3   2. Bipolar II disorder ([place]) F31.81   3. Generalized anxiety disorder F41.1   4. Restless leg syndrome G25.81     Receiving Psychotherapy: Yes With [name], LPC.   RECOMMENDATIONS: I spent 25 minutes with her and 50% of that time was in counseling concerning her memory deficits.  We discussed the fact that many of her medications can cause memory issues.  At this point, I do not want to change any of her medications but would rather have her tested by a neuropsychologist to make sure there is not a diagnosis that I am not missing.  Patient verbalizes understanding and agrees to this plan.  I will discuss this with [name] and get his opinion and we will notify the patient within the next few days with further instructions. Continue armodafinil 250 mg p.o. every morning. Continue Valium 5 mg twice daily to 3 times daily as needed. Continue Cymbalta 90 mg daily. Continue gabapentin 300 mg twice daily. Continue hydroxyzine 25 mg every 8 hours as needed. Continue Seroquel 300 mg 2 nightly. Continue trazodone 50 mg nightly as needed. Continue vitamin B12. Continue psychotherapy with [name], LPC. Return in 4 weeks or sooner as needed.  Donnal Moat, PA-C   This record has been created using [employer].  Chart creation errors have been sought, but may not always have been located and corrected. Such creation errors do not reflect on the standard of  medical care.

## 2019-02-07 ENCOUNTER — Other Ambulatory Visit: Payer: Self-pay

## 2019-02-07 ENCOUNTER — Ambulatory Visit (INDEPENDENT_AMBULATORY_CARE_PROVIDER_SITE_OTHER): Payer: 59 | Admitting: Mental Health

## 2019-02-07 DIAGNOSIS — F319 Bipolar disorder, unspecified: Secondary | ICD-10-CM | POA: Diagnosis not present

## 2019-02-07 NOTE — Progress Notes (Signed)
      [facility] Counselor/Therapist Progress Note   Patient ID: Paula Elliott, MRN: [id number]  Date:  02/07/19  Timespent: 55 minutes  Treatment Type: Individual  Reported Symptoms: Depressed mood most days, anxiety, rumination, hx of mood swings, crying spells  Mental Status Exam:    Appearance:   Casual   Behavior:  Nervous, fidgety  Motor:  WNL  Speech/Language:   Clear and Coherent  Affect:  constricted  Mood:  Anxious, depressed  Thought process:  normal  Thought content:   WNL, denies SI/HI  Sensory/Perceptual disturbances:   none  Orientation:  oriented to person, place and time/date  Attention:  Good  Concentration:  Good  Memory:  WNL  Fund of knowledge:   Good  Insight:   developing  Judgment:   developing  Impulse Control:  developing    Risk Assessment: Danger to Self: No Self-injurious Behavior: Yes (superficial) Danger to Others: No Duty to Warn:no Physical Aggression / Violence:No  Access to Firearms a concern: No  Gang Involvement:No  Patient / guardian was educated about steps to take if suicide or homicide risk level increases between visits. While future psychiatric events cannot be accurately predicted, the patient does not currently require acute inpatient psychiatric care and does not currently meet [facility] involuntary commitment criteria.   Subjective:   Patient arrived on time for today's session. Discussed progress.  Shared family stressors, communications problems w/ parents; feels her mother cannot provide support often w/o making her own issues the focus. She shared some past letters written to her by her mother to further illustrate. Patient shared how she has never been in a romantic relationship and how this has reinforced her sense of lower self-esteem, self-importance and self-worth.  Patient was encouraged to remind herself of personal strengths which were also explored during the  session.  Ways to continue to cope and care for herself, specifically coping strategies related to decreasing any self-injurious behaviors.  Provided support, understanding continue to work with patient from a cognitive behavioral framework with strength-based approaches  Interventions: Cognitive Behavioral Therapy , supportive therapy, strength based approaches  Diagnosis:   ICD-10-CM   1. Bipolar I disorder ([place]) F31.9      Plan:  1.  Patient to continue to engage in individual counseling 2-4 times a month or as needed. 2.  Patient to identify and apply CBT, coping skills learned in session to decrease depression and anxiety symptoms. 3.  Patient to contact this office, go to the local ED or call 911 if a crisis or emergency develops between visits.   Anson [place], [facility]

## 2019-02-11 NOTE — Progress Notes (Signed)
      [facility] Counselor/Therapist Progress Note   Patient ID: Adeana Grilliot, MRN: [id number]  Date: 01/24/19  Timespent: 62 minutes  Treatment Type: Individual  Reported Symptoms: Depressed mood most days, anxiety, rumination, hx of mood swings, crying spells  Mental Status Exam:    Appearance:   Casual   Behavior:  Nervous, fidgety  Motor:  WNL  Speech/Language:   Clear and Coherent  Affect:  constricted  Mood:  Anxious, depressed  Thought process:  normal  Thought content:   WNL, denies SI/HI  Sensory/Perceptual disturbances:   none  Orientation:  oriented to person, place and time/date  Attention:  Good  Concentration:  Good  Memory:  WNL  Fund of knowledge:   Good  Insight:   developing  Judgment:   developing  Impulse Control:  developing    Risk Assessment: Danger to Self: No Self-injurious Behavior: Yes (superficial) Danger to Others: No Duty to Warn:no Physical Aggression / Violence:No  Access to Firearms a concern: No  Gang Involvement:No  Patient / guardian was educated about steps to take if suicide or homicide risk level increases between visits. While future psychiatric events cannot be accurately predicted, the patient does not currently require acute inpatient psychiatric care and does not currently meet [facility] involuntary commitment criteria.   Subjective:   Patient arrived on time for today's session. Discussed progress.  Discussed some day-to-day stressors, primarily work-related initially.  She continues to seek other employment as she remains on probation at her current job and expects to have this and in April.  We reviewed some boundary setting in coping strategies to cope with workdays and some specific relationships there.  Continue to explore family related issues, her feelings about her aunt who passed away about 4 years ago and how the family continues to cope with the grief process somewhat  complicatedly.  She showed some feelings related to how her mother and grandparents continue to focus on this loss, patient feels somewhat lost in the process.  She shared the dynamics she has with her mother, who remain supportive, however, patient identified unmet needs in this relationship.  Patient was encouraged to continue journaling as she has in the past.  Encouraged her to identify strengths and we discussed some specific coping strategies injury behaviors in which she has continued.  There remained superficial per her report and evidenced by her displaying in session which were on her left forearm.  She was agreeable to follow through on coping strategies discussed.  She also was agreeable to contact this office between sessions as needed.  Interventions: Cognitive Behavioral Therapy , supportive therapy, strength based approaches  Diagnosis:   ICD-10-CM   1. Bipolar II disorder ([place]) F31.81      Plan:  1.  Patient to continue to engage in individual counseling 2-4 times a month or as needed. 2.  Patient to identify and apply CBT, coping skills learned in session to decrease depression and anxiety symptoms. 3.  Patient to contact this office, go to the local ED or call 911 if a crisis or emergency develops between visits.   Anson [place], [facility]

## 2019-02-14 ENCOUNTER — Ambulatory Visit: Payer: 59 | Admitting: Mental Health

## 2019-02-14 NOTE — Progress Notes (Signed)
      [facility] Counselor/Therapist Progress Note   Patient ID: Paula Elliott, MRN: [id number]  Date: 01/31/19  Timespent: 62 minutes  Treatment Type: Individual  Reported Symptoms: Depressed mood most days, anxiety, rumination, hx of mood swings, crying spells  Mental Status Exam:    Appearance:   Casual   Behavior:  Nervous, fidgety  Motor:  WNL  Speech/Language:   Clear and Coherent  Affect:  constricted  Mood:  Anxious, depressed  Thought process:  normal  Thought content:   WNL, denies SI/HI  Sensory/Perceptual disturbances:   none  Orientation:  oriented to person, place and time/date  Attention:  Good  Concentration:  Good  Memory:  WNL  Fund of knowledge:   Good  Insight:   developing  Judgment:   developing  Impulse Control:  developing    Risk Assessment: Danger to Self: No Self-injurious Behavior: Yes (superficial) Danger to Others: No Duty to Warn:no Physical Aggression / Violence:No  Access to Firearms a concern: No  Gang Involvement:No  Patient / guardian was educated about steps to take if suicide or homicide risk level increases between visits. While future psychiatric events cannot be accurately predicted, the patient does not currently require acute inpatient psychiatric care and does not currently meet [facility] involuntary commitment criteria.   Subjective:   Patient arrived on time for today's session. Discussed progress.  She shared how she has been making attempts to be more assertive, giving some examples of her experiences at home and at work.  She continues to seek other employment and is hopeful for a job change as she has an interview scheduled.  She continues to share her journal entries both in the past and current.  Continue to work with patient on improving coping specifically around any self-injurious behaviors. She was agreeable to follow through on coping strategies discussed.  She also was  agreeable to contact this office between sessions as needed.  Interventions: Cognitive Behavioral Therapy , supportive therapy, strength based approaches  Diagnosis:   ICD-10-CM   1. Bipolar II disorder (Paula Elliott) F31.81      Plan:  1.  Patient to continue to engage in individual counseling 2-4 times a month or as needed. 2.  Patient to identify and apply CBT, coping skills learned in session to decrease depression and anxiety symptoms. 3.  Patient to contact this office, go to the local ED or call 911 if a crisis or emergency develops between visits.   Anson [place], [facility]

## 2019-02-15 ENCOUNTER — Ambulatory Visit: Payer: 59 | Admitting: Mental Health

## 2019-02-15 ENCOUNTER — Other Ambulatory Visit: Payer: Self-pay

## 2019-02-15 DIAGNOSIS — F319 Bipolar disorder, unspecified: Secondary | ICD-10-CM | POA: Diagnosis not present

## 2019-02-15 NOTE — Progress Notes (Signed)
      [facility] Counselor/Therapist Progress Note   Patient ID: Paula Elliott, MRN: [id number]  Date:  02/15/19  Timespent: 59 minutes  Treatment Type: Individual  Reported Symptoms: Depressed mood most days, anxiety, rumination, hx of mood swings, crying spells  Mental Status Exam:    Appearance:   Casual   Behavior:  Nervous, fidgety  Motor:  WNL  Speech/Language:   Clear and Coherent  Affect:  constricted  Mood:  Anxious, depressed  Thought process:  normal  Thought content:   WNL, denies SI/HI  Sensory/Perceptual disturbances:   none  Orientation:  oriented to person, place and time/date  Attention:  Good  Concentration:  Good  Memory:  WNL  Fund of knowledge:   Good  Insight:   developing  Judgment:   developing  Impulse Control:  developing    Risk Assessment: Danger to Self: No Self-injurious Behavior: Yes (superficial) Danger to Others: No Duty to Warn:no Physical Aggression / Violence:No  Access to Firearms a concern: No  Gang Involvement:No  Patient / guardian was educated about steps to take if suicide or homicide risk level increases between visits. While future psychiatric events cannot be accurately predicted, the patient does not currently require acute inpatient psychiatric care and does not currently meet [facility] involuntary commitment criteria.   Subjective:   Patient arrived on time for today's session. Discussed progress.  She shared how she has been on the job interview recently, and is hopeful.  Shared current stressors at work and at home.  Continue to share some feelings related to the loss of her aunt and how her family continues to "not move on".  The anniversary is next month of her passing which is approximately 5 years ago.  Continue to assist patient in processing issues, some related to family which she feels she still carries to the state.  Continues to endorse low self-esteem and  self-worth.  Encouraged her to identify strengths between sessions for review next session.   Interventions: Cognitive Behavioral Therapy , supportive therapy, strength based approaches  Diagnosis:   ICD-10-CM   1. Bipolar I disorder ([place]) F31.9      Plan:  1.  Patient to continue to engage in individual counseling 2-4 times a month or as needed. 2.  Patient to identify and apply CBT, coping skills learned in session to decrease depression and anxiety symptoms. 3.  Patient to contact this office, go to the local ED or call 911 if a crisis or emergency develops between visits.   Anson [place], [facility]

## 2019-02-20 ENCOUNTER — Other Ambulatory Visit: Payer: Self-pay | Admitting: Physician Assistant

## 2019-02-21 ENCOUNTER — Ambulatory Visit: Payer: 59 | Admitting: Mental Health

## 2019-02-26 ENCOUNTER — Telehealth: Payer: Self-pay

## 2019-02-26 ENCOUNTER — Telehealth: Payer: Self-pay | Admitting: Physician Assistant

## 2019-02-26 DIAGNOSIS — M25511 Pain in right shoulder: Secondary | ICD-10-CM | POA: Diagnosis not present

## 2019-02-26 DIAGNOSIS — R079 Chest pain, unspecified: Secondary | ICD-10-CM | POA: Diagnosis not present

## 2019-02-26 DIAGNOSIS — R0789 Other chest pain: Secondary | ICD-10-CM | POA: Diagnosis not present

## 2019-02-26 DIAGNOSIS — I517 Cardiomegaly: Secondary | ICD-10-CM | POA: Diagnosis not present

## 2019-02-26 NOTE — Telephone Encounter (Signed)
Patient states that she has had chest pain on and off for quite some time. She came in to the office last July and had a work up with an EKG and nothing was found. She states that it has continued on and off and it starts in her sternum and radiates to her right side and shoulder. She thought it may be anxiety but she takes her valium when these episodes occur and there is no change in the pain. Offered an office visit to discuss and re evaluate but patient just wants to know if you have any ideas what else it may be. Please review and advise

## 2019-02-26 NOTE — Telephone Encounter (Signed)
Possibilities are cardiac or pulmonary. Would she want a referral to those?  They do further testing.

## 2019-02-26 NOTE — Telephone Encounter (Signed)
Pt advised of provider feedback and pt states she will think about it and get back to [place].

## 2019-02-27 ENCOUNTER — Telehealth: Payer: Self-pay

## 2019-02-27 NOTE — Telephone Encounter (Signed)
Patient was seen in the ER at [facility] 02/26/2019. Was told to follow up with Cardiolgy. Will request records for review.Marland Kitchen

## 2019-02-28 ENCOUNTER — Other Ambulatory Visit: Payer: Self-pay | Admitting: Physician Assistant

## 2019-02-28 ENCOUNTER — Ambulatory Visit (INDEPENDENT_AMBULATORY_CARE_PROVIDER_SITE_OTHER): Payer: 59 | Admitting: Mental Health

## 2019-02-28 DIAGNOSIS — F319 Bipolar disorder, unspecified: Secondary | ICD-10-CM

## 2019-02-28 DIAGNOSIS — F321 Major depressive disorder, single episode, moderate: Secondary | ICD-10-CM

## 2019-03-01 ENCOUNTER — Ambulatory Visit (INDEPENDENT_AMBULATORY_CARE_PROVIDER_SITE_OTHER): Payer: 59 | Admitting: Physician Assistant

## 2019-03-01 ENCOUNTER — Encounter: Payer: Self-pay | Admitting: Physician Assistant

## 2019-03-01 DIAGNOSIS — G4700 Insomnia, unspecified: Secondary | ICD-10-CM

## 2019-03-01 DIAGNOSIS — F319 Bipolar disorder, unspecified: Secondary | ICD-10-CM | POA: Diagnosis not present

## 2019-03-01 DIAGNOSIS — F411 Generalized anxiety disorder: Secondary | ICD-10-CM | POA: Diagnosis not present

## 2019-03-01 DIAGNOSIS — G2581 Restless legs syndrome: Secondary | ICD-10-CM

## 2019-03-01 MED ORDER — GABAPENTIN 300 MG PO CAPS: 300.0000 mg | ORAL_CAPSULE | Freq: Two times a day (BID) | ORAL | 2 refills | Status: DC

## 2019-03-01 MED ORDER — ARMODAFINIL 250 MG PO TABS: 250.0000 mg | ORAL_TABLET | Freq: Every morning | ORAL | 2 refills | Status: DC

## 2019-03-01 MED ORDER — VITAMIN B-12 500 MCG PO TABS: 500.0000 ug | ORAL_TABLET | Freq: Every day | ORAL | 2 refills | Status: DC

## 2019-03-01 MED ORDER — TRAZODONE HCL 100 MG PO TABS: 100.0000 mg | ORAL_TABLET | Freq: Every evening | ORAL | 2 refills | Status: DC | PRN

## 2019-03-01 MED ORDER — CLONAZEPAM 0.5 MG PO TABS: 0.5000 mg | ORAL_TABLET | Freq: Three times a day (TID) | ORAL | 0 refills | Status: DC | PRN

## 2019-03-01 MED ORDER — QUETIAPINE FUMARATE 300 MG PO TABS: 600.0000 mg | ORAL_TABLET | Freq: Every day | ORAL | 2 refills | Status: DC

## 2019-03-01 MED ORDER — DULOXETINE HCL 60 MG PO CPEP: 120.0000 mg | ORAL_CAPSULE | Freq: Every day | ORAL | 2 refills | Status: DC

## 2019-03-01 MED ORDER — METFORMIN HCL 1000 MG PO TABS: 1000.0000 mg | ORAL_TABLET | Freq: Every day | ORAL | 2 refills | Status: DC

## 2019-03-01 NOTE — Progress Notes (Signed)
[facility] Med Check  Patient ID: Paula Elliott,  MRN: [id number]  PCP: Terald Sleeper, PA-C  Date of Evaluation: 03/01/2019 Time spent:15 minutes  Chief Complaint:  Chief Complaint    Follow-up     Virtual Visit via Telephone Note  I connected with Paula Elliott on 03/01/19 at  4:00 PM EDT by telephone and verified that I am speaking with the correct person using two identifiers.   I discussed the limitations, risks, security and privacy concerns of performing an evaluation and management service by telephone and the availability of in person appointments. I also discussed with the patient that there may be a patient responsible charge related to this service. The patient expressed understanding and agreed to proceed.    HISTORY/CURRENT STATUS: HPI for routine med check.  She still having several problems.  She is having a mixture anxiety and depression with easy crying, energy is still low although motivation is a little bit better since being on armodafanil.  She pushes her self enough to go to work but that is about yet.  She does not really enjoy anything.  Of course now it is difficult because of the coronavirus pandemic in the shelter in place order.  She denies suicidal or homicidal thoughts.  She is having more chest pain than she was.  Has had it checked out and was diagnosed with chest wall pain and given meloxicam.  She is not sure if it is helping yet or not.  When it hurts, that makes her more anxious and then it hurts more.  She has attacks usually once a day or so.  The Valium is not helping like it was.  There has been a few days that she takes it 3 times a day and it still does not feel like it is helping a lot.  Her chest will get tight and she has palpitations.  It usually lasts anywhere from 30 minutes to a couple of hours.  She sometimes has shortness of breath with that as well.  She wakes up a lot during the night.  She is usually able to go  to sleep easily.  Patient denies increased energy with decreased need for sleep, no increased talkativeness, no racing thoughts, no impulsivity or risky behaviors, no increased spending, no increased libido, no grandiosity.  Denies muscle or joint pain, stiffness, or dystonia.  Denies dizziness, syncope, seizures, numbness, tingling, tremor, tics, unsteady gait, slurred speech, confusion.   Individual Medical History/ Review of Systems: Changes? :No   Allergies: Patient has no known allergies.  Current Medications:  Current Outpatient Medications:  .  Armodafinil 250 MG tablet, Take 1 tablet (250 mg total) by mouth every morning., Disp: 30 tablet, Rfl: 2 .  gabapentin (NEURONTIN) 300 MG capsule, Take 1 capsule (300 mg total) by mouth 2 (two) times daily., Disp: 60 capsule, Rfl: 2 .  hydrOXYzine (ATARAX/VISTARIL) 25 MG tablet, Take 1 tablet (25 mg total) by mouth every 8 (eight) hours as needed., Disp: 90 tablet, Rfl: 2 .  minocycline (MINOCIN,DYNACIN) 50 MG capsule, Take by mouth daily., Disp: , Rfl: 3 .  QUEtiapine (SEROQUEL) 300 MG tablet, Take 2 tablets (600 mg total) by mouth at bedtime., Disp: 60 tablet, Rfl: 2 .  vitamin B-12 (CYANOCOBALAMIN) 500 MCG tablet, Take 1 tablet (500 mcg total) by mouth daily., Disp: 30 tablet, Rfl: 2 .  clonazePAM (KLONOPIN) 0.5 MG tablet, Take 1 tablet (0.5 mg total) by mouth 3 (three) times daily as needed for anxiety.,  Disp: 90 tablet, Rfl: 0 .  DULoxetine (CYMBALTA) 60 MG capsule, Take 2 capsules (120 mg total) by mouth daily., Disp: 60 capsule, Rfl: 2 .  HYDROcodone-homatropine (HYCODAN) 5-1.5 MG/5ML syrup, Take 5-10 mLs by mouth every 6 (six) hours as needed. (Patient not taking: Reported on 03/01/2019), Disp: 240 mL, Rfl: 0 .  metFORMIN (GLUCOPHAGE) 1000 MG tablet, Take 1 tablet (1,000 mg total) by mouth daily. with food, Disp: 30 tablet, Rfl: 2 .  traZODone (DESYREL) 100 MG tablet, Take 1-2 tablets (100-200 mg total) by mouth at bedtime as needed for  sleep., Disp: 60 tablet, Rfl: 2 Medication Side Effects: none  Family Medical/ Social History: Changes? Yes things are slower at work because of the coronavirus shelter in place.  MENTAL HEALTH EXAM:  There were no vitals taken for this visit.There is no height or weight on file to calculate BMI.  General Appearance: phone visit, unable to assess  Eye Contact:  unable to assess  Speech:  Clear and Coherent  Volume:  Normal  Mood:  Euthymic  Affect:  unable to assess  Thought Process:  Goal Directed  Orientation:  Full (Time, Place, and Person)  Thought Content: Logical   Suicidal Thoughts:  No  Homicidal Thoughts:  No  Memory:  WNL  Judgement:  Good  Insight:  Good  Psychomotor Activity:  unable to assess  Concentration:  Concentration: Good  Recall:  Good  Fund of Knowledge: Good  Language: Good  Assets:  Desire for Improvement  ADL's:  Intact  Cognition: WNL  Prognosis:  Good    DIAGNOSES:    ICD-10-CM   1. Bipolar I disorder ([place]) F31.9   2. Generalized anxiety disorder F41.1   3. Insomnia, unspecified type G47.00   4. Restless leg syndrome G25.81     Receiving Psychotherapy: Yes    RECOMMENDATIONS: DC Valium. Start Klonopin 0.5 mg 3 times daily as needed.  For 2 to 3 days, I have encouraged her to take this at least twice a day whether she feels like she needs it or not, to help decrease the anxiety.  Thereafter, she should take it as needed. Continue armodafinil 250 mg every morning. Continue gabapentin 300 mg twice daily. Increase Cymbalta to 120 mg daily. Continue hydroxyzine 25 mg every 8 H PRN. Continue metformin 1 g daily. Continue Seroquel 600 mg nightly. Continue B12 daily Continue trazodone 100 to 200 mg nightly as needed sleep. Discussed sleep hygiene. Continue psychotherapy with [name], LPC. Return in 4 weeks or sooner as needed.   Donnal Moat, PA-C   This record has been created using [employer].  Chart creation errors have  been sought, but may not always have been located and corrected. Such creation errors do not reflect on the standard of medical care.

## 2019-03-04 ENCOUNTER — Encounter: Payer: Self-pay | Admitting: Mental Health

## 2019-03-06 ENCOUNTER — Encounter: Payer: Self-pay | Admitting: *Deleted

## 2019-03-07 ENCOUNTER — Ambulatory Visit (INDEPENDENT_AMBULATORY_CARE_PROVIDER_SITE_OTHER): Payer: 59 | Admitting: Mental Health

## 2019-03-07 ENCOUNTER — Other Ambulatory Visit: Payer: Self-pay

## 2019-03-07 DIAGNOSIS — F3181 Bipolar II disorder: Secondary | ICD-10-CM | POA: Diagnosis not present

## 2019-03-12 ENCOUNTER — Telehealth (INDEPENDENT_AMBULATORY_CARE_PROVIDER_SITE_OTHER): Payer: 59 | Admitting: Cardiovascular Disease

## 2019-03-12 ENCOUNTER — Encounter: Payer: Self-pay | Admitting: Cardiovascular Disease

## 2019-03-12 VITALS — Wt 232.0 lb

## 2019-03-12 DIAGNOSIS — I453 Trifascicular block: Secondary | ICD-10-CM

## 2019-03-12 DIAGNOSIS — Q249 Congenital malformation of heart, unspecified: Secondary | ICD-10-CM

## 2019-03-12 DIAGNOSIS — Z9289 Personal history of other medical treatment: Secondary | ICD-10-CM

## 2019-03-12 DIAGNOSIS — R079 Chest pain, unspecified: Secondary | ICD-10-CM | POA: Diagnosis not present

## 2019-03-12 DIAGNOSIS — Z9889 Other specified postprocedural states: Secondary | ICD-10-CM

## 2019-03-12 DIAGNOSIS — F419 Anxiety disorder, unspecified: Secondary | ICD-10-CM | POA: Diagnosis not present

## 2019-03-12 NOTE — Patient Instructions (Signed)
Medication Instructions:  Continue all current medications.  Labwork: none  Testing/Procedures:  Your physician has requested that you have an echocardiogram. Echocardiography is a painless test that uses sound waves to create images of your heart. It provides your doctor with information about the size and shape of your heart and how well your heart's chambers and valves are working. This procedure takes approximately one hour. There are no restrictions for this procedure.  This can be scheduled for June or July.  Follow-Up: Your physician wants you to follow up in: 5 months.  (September)    Any Other Special Instructions Will Be Listed Below (If Applicable).  If you need a refill on your cardiac medications before your next appointment, please call your pharmacy.

## 2019-03-12 NOTE — Progress Notes (Signed)
Virtual Visit via Video Note   This visit type was conducted due to national recommendations for restrictions regarding the COVID-19 Pandemic (e.g. social distancing) in an effort to limit this patient's exposure and mitigate transmission in our community.  Due to her co-morbid illnesses, this patient is at least at moderate risk for complications without adequate follow up.  This format is felt to be most appropriate for this patient at this time.  All issues noted in this document were discussed and addressed.  A limited physical exam was performed with this format.  Please refer to the patient's chart for her consent to telehealth for [facility].   Evaluation Performed:  New Patient visit  Date:  03/12/2019   ID:  Paula Elliott, DOB 14-May-1991, MRN [id number]  Patient Location: Home  Provider Location: Office  PCP:  Terald Sleeper, PA-C  Cardiologist:  None Electrophysiologist:  None   Chief Complaint: Chest pain  History of Present Illness:    Paula Elliott is a 28 y.o. female who presents via audio/video conferencing for a telehealth visit today.    She was evaluated in the ED at [facility] on 02/26/2019.  I personally reviewed all relevant documentation, labs, and studies.  It was apparently located on the right side of her chest and radiating to her right shoulder and arm.  She apparently underwent cardiac surgery at age 63.  She smokes cigarettes.  Blood pressure was 139/93 and heart rate was 107 on initial evaluation.  X-ray showed cardiomegaly and slight vascular congestion.  There was no evidence of pneumonia.  Blood test showed normal renal function and liver transaminases.  Troponins were normal x2.  D-dimer was normal.  White blood cell count was mildly elevated at 11.8.  Hemoglobin was normal at 13.8.  Urine toxicology screen was positive for methadone and benzodiazepines.  I personally reviewed the ECG which demonstrated sinus rhythm with right bundle  branch block and left posterior fascicular block.  I compared this to an ECG dated 06/26/2018 and it was similar.  She told me she frequently has chest pains which occur usually in the mornings and relates it to her significant anxiety disorder.  She describes as a heaviness and sometimes sharp.  She denies exertional chest pain.  She said she is overweight and smokes and has chronic exertional dyspnea related to this but this has not gotten any worse.  She has had symptoms of GERD in the past and says the symptoms she had when she went to the ED were different.  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  She has minimal scoliosis and has some hip pain.  She said she is chronically fatigued but energy levels have not declined in recent months.  She also attributes this to her anxiety disorder and sees a psychiatrist for this.  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  The patient does not have symptoms concerning for COVID-19 infection (fever, chills, cough, or new shortness of breath).    Past Medical History:  Diagnosis Date  . Anxiety   . Depression   . Heart murmur    Past Surgical History:  Procedure Laterality Date  . HIP OPEN REDUCTION  1994  . open heart   1996     No outpatient medications have been marked  as taking for the 03/12/19 encounter (Appointment) with Herminio Commons, MD.     Allergies:   Patient has no known allergies.   Social History   Tobacco Use  . Smoking status: Current Every Day Smoker    Packs/day: 0.50    Types: Cigarettes  . Smokeless tobacco: Never Used  Substance Use Topics  . Alcohol use: Yes    Alcohol/week: 2.0 standard drinks    Types: 2 Glasses of wine per week    Comment: rare  . Drug use: No     Family Hx: The patient's family history  includes Depression in her father and mother; Hyperlipidemia in her mother; Hypertension in her mother.  ROS:   Please see the history of present illness.     All other systems reviewed and are negative.   Prior CV studies:   The following studies were reviewed today:    Labs/Other Tests and Data Reviewed:    EKG: Sinus rhythm with right bundle branch block and left posterior fascicular block  Recent Labs: No results found for requested labs within last 8760 hours.   Recent Lipid Panel No results found for: CHOL, TRIG, HDL, CHOLHDL, LDLCALC, LDLDIRECT  Wt Readings from Last 3 Encounters:  09/13/18 219 lb (99.3 kg)  06/26/18 214 lb (97.1 kg)  12/09/17 225 lb (102.1 kg)     Objective:    Vital Signs:  There were no vitals taken for this visit.   Well nourished, well developed female in no acute distress. Normal JVP HEENT: [place], [place]/at  ASSESSMENT & PLAN:    1.  Chest pain: Symptoms are atypical.  Given her history of congenital heart disease, I will obtain an echocardiogram to evaluate cardiac structure and function after the [place] pandemic has subsided.  2.  Right bundle branch block and left posterior fascicular block: This is likely secondary to surgery for congenital heart disease.  She denies palpitations and dizziness.  3.  History of congenital heart disease: She underwent surgery at age 72.  The details are unclear.  I will obtain an echocardiogram to evaluate cardiac structure and function after the [place] pandemic has subsided.   COVID-19 Education: The signs and symptoms of COVID-19 were discussed with the patient and how to seek care for testing (follow up with PCP or arrange E-visit).  The importance of social distancing was discussed today.  Time:   Today, I have spent 45 minutes with the patient with telehealth technology discussing the above problems.     Medication Adjustments/Labs and Tests Ordered: Current medicines are reviewed at length with  the patient today.  Concerns regarding medicines are outlined above.  Tests Ordered: No orders of the defined types were placed in this encounter.  Medication Changes: No orders of the defined types were placed in this encounter.   Disposition:  Follow up September 2020  Signed, Kate Sable, MD  03/12/2019 9:52 AM    [place]

## 2019-03-14 ENCOUNTER — Ambulatory Visit (INDEPENDENT_AMBULATORY_CARE_PROVIDER_SITE_OTHER): Payer: 59 | Admitting: Mental Health

## 2019-03-14 ENCOUNTER — Other Ambulatory Visit: Payer: Self-pay

## 2019-03-14 ENCOUNTER — Telehealth: Payer: Self-pay | Admitting: Physician Assistant

## 2019-03-14 DIAGNOSIS — F3181 Bipolar II disorder: Secondary | ICD-10-CM | POA: Diagnosis not present

## 2019-03-14 NOTE — Telephone Encounter (Signed)
RTC and she understood instructions. Recommend she call back with anymore problems

## 2019-03-14 NOTE — Progress Notes (Addendum)
      [facility] Counselor/Therapist Progress Note   Patient ID: Paula Elliott, MRN: [id number] Date:  03/14/19 Timespent: 58 minutes  Treatment Type: Individual  Virtual Visit via Telephone Note Connected with patient by a video enabled telemedicine/telehealth application or telephone, with their informed consent, and verified patient privacy and that I am speaking with the correct person using two identifiers. I discussed the limitations, risks, security and privacy concerns of performing psychotherapy and management service by telephone and the availability of in person appointments. I also discussed with the patient that there may be a patient responsible charge related to this service. The patient expressed understanding and agreed to proceed. I discussed the treatment planning with the patient. The patient was provided an opportunity to ask questions and all were answered. The patient agreed with the plan and demonstrated an understanding of the instructions. The patient was advised to call  our office if  symptoms worsen or feel they are in a crisis state and need immediate contact.  Therapist Location: home Patient Location: home  Mental Status Exam:    Appearance:   Casual   Behavior:  Nervous, fidgety  Motor:  WNL  Speech/Language:   Clear and Coherent  Affect:  constricted  Mood:  Anxious, depressed  Thought process:  normal  Thought content:   WNL, denies SI/HI  Sensory/Perceptual disturbances:   none  Orientation:  oriented to person, place and time/date  Attention:  Good  Concentration:  Good  Memory:  WNL  Fund of knowledge:   Good  Insight:   developing  Judgment:   developing  Impulse Control:  developing   Reported Symptoms: Depressed mood most days, anxiety, rumination, hx of mood swings, crying spells  Risk Assessment: Danger to Self: No Self-injurious Behavior: Yes (superficial) Danger to Others: No Duty to  Warn:no Physical Aggression / Violence:No  Access to Firearms a concern: No  Gang Involvement:No  Patient / guardian was educated about steps to take if suicide or homicide risk level increases between visits. While future psychiatric events cannot be accurately predicted, the patient does not currently require acute inpatient psychiatric care and does not currently meet [facility] involuntary commitment criteria.  Subjective:   Patient arrived on time for today's session. Discussed progress.  She shared how she has been on the job interview recently, and is hopeful.  Shared current stressors at work and at home.  Continue to share some feelings related to the loss of her aunt and how her family continues to "not move on".  The anniversary is next month of her passing which is approximately 5 years ago.  Continue to assist patient in processing issues, some related to family which she feels she still carries to the state.  Continues to endorse low self-esteem and self-worth.  Encouraged her to identify strengths between sessions for review next session.  Interventions: Cognitive Behavioral Therapy , supportive therapy, strength based approaches  Diagnosis:   ICD-10-CM   1. Bipolar II disorder ([place]) F31.81     Plan:  1.  Patient to continue to engage in individual counseling 2-4 times a month or as needed. 2.  Patient to identify and apply CBT, coping skills learned in session to decrease depression and anxiety symptoms. 3.  Patient to contact this office, go to the local ED or call 911 if a crisis or emergency develops between visits.   Anson [place], [facility]

## 2019-03-14 NOTE — Telephone Encounter (Signed)
Please call and tell her it's ok to take Valium, and taking 2 is fine.  Do not take any Klonopin for the rest of the day though.

## 2019-03-14 NOTE — Telephone Encounter (Signed)
Paula Elliott called upset.  She is having a lot of issues and having a hard time.  She took her Klonopin as usual this morning but does not have it with her.  She is having an episode now and wants to know if she can take the Valium she used to take - 5mg  wants to take 2.  Please call her asap and let her know what to do. [phone].  Can leave a message if she doesn't answer.

## 2019-03-21 ENCOUNTER — Other Ambulatory Visit: Payer: Self-pay | Admitting: Physician Assistant

## 2019-03-21 ENCOUNTER — Ambulatory Visit (INDEPENDENT_AMBULATORY_CARE_PROVIDER_SITE_OTHER): Payer: 59 | Admitting: Mental Health

## 2019-03-21 ENCOUNTER — Other Ambulatory Visit: Payer: Self-pay

## 2019-03-21 DIAGNOSIS — F3181 Bipolar II disorder: Secondary | ICD-10-CM | POA: Diagnosis not present

## 2019-03-22 NOTE — Telephone Encounter (Signed)
Please refill if appropriate

## 2019-03-25 NOTE — Progress Notes (Signed)
      [facility] Counselor/Therapist Progress Note   Patient ID: Paula Elliott, MRN: [id number]  Date: 03/07/19  Timespent: 60 minutes  Treatment Type: Individual   Virtual Visit via Telephone Note Connected with patient by a video enabled telemedicine/telehealth application or telephone, with their informed consent, and verified patient privacy and that I am speaking with the correct person using two identifiers. I discussed the limitations, risks, security and privacy concerns of performing psychotherapy and management service by telephone and the availability of in person appointments. I also discussed with the patient that there may be a patient responsible charge related to this service. The patient expressed understanding and agreed to proceed. I discussed the treatment planning with the patient. The patient was provided an opportunity to ask questions and all were answered. The patient agreed with the plan and demonstrated an understanding of the instructions. The patient was advised to call  our office if  symptoms worsen or feel they are in a crisis state and need immediate contact.  Therapist Location: home Patient Location: home  Start time: 6:00pm Stop time:  7:00pm  Mental Status Exam:    Appearance:   Casual   Behavior:  Nervous, fidgety  Motor:  WNL  Speech/Language:   Clear and Coherent  Affect:  constricted  Mood:  Anxious, depressed  Thought process:  normal  Thought content:   WNL, denies SI/HI  Sensory/Perceptual disturbances:   none  Orientation:  oriented to person, place and time/date  Attention:  Good  Concentration:  Good  Memory:  WNL  Fund of knowledge:   Good  Insight:   developing  Judgment:   developing  Impulse Control:  developing   Reported Symptoms: Depressed mood most days, anxiety, rumination, hx of mood swings, crying spells  Risk Assessment: Danger to Self: No Self-injurious Behavior: Yes  (superficial) Danger to Others: No Duty to Warn:no Physical Aggression / Violence:No  Access to Firearms a concern: No  Gang Involvement:No  Patient / guardian was educated about steps to take if suicide or homicide risk level increases between visits. While future psychiatric events cannot be accurately predicted, the patient does not currently require acute inpatient psychiatric care and does not currently meet [facility] involuntary commitment criteria.  Subjective:   Patient engaged in tele-therapy session.  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  She continues to want to look for other employment opportunities when possible.  Patient shared family stressors, her grandmother's ongoing health issues.  Patient continues to cope with low self-esteem and struggles to identify strengths.  Patient was given assignment to identify self strengths, characteristics between sessions for review next session.  Patient reports she has struggled with this assignment but will follow through. Interventions: Cognitive Behavioral Therapy , supportive therapy, strength based approaches  Diagnosis:   ICD-10-CM   1. Bipolar II disorder ([place]) F31.81      Plan:  1.  Patient to continue to engage in individual counseling 2-4 times a month or as needed. 2.  Patient to identify and apply CBT, coping skills learned in session to decrease depression and anxiety symptoms. 3.  Patient to contact this office, go to the local ED or call 911 if a crisis or emergency develops between visits.   Anson [place], [facility]

## 2019-03-25 NOTE — Progress Notes (Signed)
[facility] Counselor/Therapist Progress Note   Patient ID: Paula Elliott, MRN: [id number]  Date: 02/28/19  Timespent: 61 minutes  Treatment Type: Individual   Virtual Visit via Telephone Note Connected with patient by a video enabled telemedicine/telehealth application or telephone, with their informed consent, and verified patient privacy and that I am speaking with the correct person using two identifiers. I discussed the limitations, risks, security and privacy concerns of performing psychotherapy and management service by telephone and the availability of in person appointments. I also discussed with the patient that there may be a patient responsible charge related to this service. The patient expressed understanding and agreed to proceed. I discussed the treatment planning with the patient. The patient was provided an opportunity to ask questions and all were answered. The patient agreed with the plan and demonstrated an understanding of the instructions. The patient was advised to call  our office if  symptoms worsen or feel they are in a crisis state and need immediate contact.  Therapist Location: home Patient Location: home  Start time: 6:00pm Stop time:  7:01pm  Mental Status Exam:    Appearance:   Casual   Behavior:  Nervous, fidgety  Motor:  WNL  Speech/Language:   Clear and Coherent  Affect:  constricted  Mood:  Anxious, depressed  Thought process:  normal  Thought content:   WNL, denies SI/HI  Sensory/Perceptual disturbances:   none  Orientation:  oriented to person, place and time/date  Attention:  Good  Concentration:  Good  Memory:  WNL  Fund of knowledge:   Good  Insight:   developing  Judgment:   developing  Impulse Control:  developing   Reported Symptoms: Depressed mood most days, anxiety, rumination, hx of mood swings, crying spells  Risk Assessment: Danger to Self: No Self-injurious Behavior: Yes  (superficial) Danger to Others: No Duty to Warn:no Physical Aggression / Violence:No  Access to Firearms a concern: No  Gang Involvement:No  Patient / guardian was educated about steps to take if suicide or homicide risk level increases between visits. While future psychiatric events cannot be accurately predicted, the patient does not currently require acute inpatient psychiatric care and does not currently meet [facility] involuntary commitment criteria.  Subjective:   Patient engaged in tele-therapy session.  Patient shared work experiences, how she and one other employee are still required to report to work.  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  She shared some frustrations at work, time was spent to allow her to process thoughts and feelings related to inconsistencies with rules and a lack of communication by her superiors.  Patient continues to look for other job employment opportunities.  She stated that she was waiting to hear back from an interview, however, due to the recent viral pandemic she has not heard back.  She shared family relationships.  She stated that her grandmother has had some increased medical issues recently and has given her increased stress along with family.  Continues to express complicated grief related to her aunt's passing and need for improved communication with her mother.  His communication was explored and strategies discussed.  Patient plans to identify self strengths between sessions as well as follow through on strategies.  Interventions: Cognitive Behavioral Therapy , supportive therapy, strength based approaches  Diagnosis:   ICD-10-CM   1. Bipolar I disorder ([place]) F31.9      Plan:  1.  Patient to continue to engage in  individual counseling 2-4 times a month or as needed. 2.  Patient to identify and apply CBT, coping skills learned in session to decrease depression and anxiety symptoms. 3.  Patient to contact this office,  go to the local ED or call 911 if a crisis or emergency develops between visits.   Anson [place], [facility]

## 2019-03-27 NOTE — Progress Notes (Signed)
[facility] Counselor/Therapist Progress Note   Patient ID: Paula Elliott,  MRN: [id number] Date:  03/21/19 Timespent: 57 minutes  Treatment Type: Individual  Virtual Visit via Telephone Note Connected with patient by a video enabled telemedicine/telehealth application or telephone, with their informed consent, and verified patient privacy and that I am speaking with the correct person using two identifiers. I discussed the limitations, risks, security and privacy concerns of performing psychotherapy and management service by telephone and the availability of in person appointments. I also discussed with the patient that there may be a patient responsible charge related to this service. The patient expressed understanding and agreed to proceed. I discussed the treatment planning with the patient. The patient was provided an opportunity to ask questions and all were answered. The patient agreed with the plan and demonstrated an understanding of the instructions. The patient was advised to call  our office if  symptoms worsen or feel they are in a crisis state and need immediate contact.  Therapist Location: home Patient Location: home  Mental Status Exam:    Appearance:   Casual   Behavior:  Nervous, fidgety  Motor:  WNL  Speech/Language:   Clear and Coherent  Affect:  constricted  Mood:  Anxious, depressed  Thought process:  normal  Thought content:   WNL, denies SI/HI  Sensory/Perceptual disturbances:   none  Orientation:  oriented to person, place and time/date  Attention:  Good  Concentration:  Good  Memory:  WNL  Fund of knowledge:   Good  Insight:   developing  Judgment:   developing  Impulse Control:  developing   Reported Symptoms: Depressed mood most days, anxiety, rumination, hx of mood swings, crying spells  Risk Assessment: Danger to Self: No Self-injurious Behavior: Yes (superficial) Danger to Others: No Duty to  Warn:no Physical Aggression / Violence:No  Access to Firearms a concern: No  Gang Involvement:No  Patient / guardian was educated about steps to take if suicide or homicide risk level increases between visits. While future psychiatric events cannot be accurately predicted, the patient does not currently require acute inpatient psychiatric care and does not currently meet [facility] involuntary commitment criteria.  Subjective:   Patient arrived on time for today's session. Discussed progress recent events.  She shared how she continues to cope with concerns about her health related to her heart.  She followed up with her doctor appointment with the cardiologist and engaged in some testing.  She shared she is coping with some pain levels and plans to follow through with her treatment plan.  Ways to manage her pain through diaphragmatic breathing was discussed.  Encouraged her to utilize cognitive mindfulness concepts discussed as well.  Provided support as she shared the current status of her grandmother who is in the hospital.  The impact this is had on her and the family was discussed, feelings process.  Support provided, continue to work with patient from a cognitive behavioral framework with a focus on patient identifying personal strengths.  Interventions: Cognitive Behavioral Therapy , supportive therapy, strength based approaches  Diagnosis:   ICD-10-CM   1. Bipolar II disorder ([place]) F31.81     Plan:  1.  Patient to continue to engage in individual counseling 2-4 times a month or as needed. 2.  Patient to identify and apply CBT, coping skills learned in session to decrease depression and anxiety symptoms. 3.  Patient to contact this office, go to the local ED or call 911 if  a crisis or emergency develops between visits.   Anson [place], [facility]

## 2019-03-28 ENCOUNTER — Other Ambulatory Visit: Payer: Self-pay

## 2019-03-28 ENCOUNTER — Ambulatory Visit (INDEPENDENT_AMBULATORY_CARE_PROVIDER_SITE_OTHER): Payer: 59 | Admitting: Mental Health

## 2019-03-28 DIAGNOSIS — F3181 Bipolar II disorder: Secondary | ICD-10-CM | POA: Diagnosis not present

## 2019-03-28 NOTE — Progress Notes (Signed)
      [facility] Counselor/Therapist Progress Note   Patient ID: Paula Elliott,  MRN: [id number] Date:  03/28/19 Timespent: 59 minutes  Treatment Type: Individual  Virtual Visit via Telephone Note Connected with patient by a video enabled telemedicine/telehealth application or telephone, with their informed consent, and verified patient privacy and that I am speaking with the correct person using two identifiers. I discussed the limitations, risks, security and privacy concerns of performing psychotherapy and management service by telephone and the availability of in person appointments. I also discussed with the patient that there may be a patient responsible charge related to this service. The patient expressed understanding and agreed to proceed. I discussed the treatment planning with the patient. The patient was provided an opportunity to ask questions and all were answered. The patient agreed with the plan and demonstrated an understanding of the instructions. The patient was advised to call  our office if  symptoms worsen or feel they are in a crisis state and need immediate contact.  Therapist Location: home Patient Location: home  Mental Status Exam:    Appearance:   UTA - Unable to assess  Behavior:  UTA  Motor:  UTA  Speech/Language:   Clear and Coherent  Affect:  UTA  Mood:  Anxious, depressed  Thought process:  normal  Thought content:   WNL, denies SI/HI  Sensory/Perceptual disturbances:   none  Orientation:  oriented to person, place and time/date  Attention:  Good  Concentration:  Good  Memory:  WNL  Fund of knowledge:   Good  Insight:   developing  Judgment:   developing  Impulse Control:  developing   Reported Symptoms: Depressed mood most days, anxiety, rumination, hx of mood swings, crying spells  Risk Assessment: Danger to Self: No Self-injurious Behavior: Yes (superficial) Danger to Others: No Duty to  Warn:no Physical Aggression / Violence:No  Access to Firearms a concern: No  Gang Involvement:No  Patient / guardian was educated about steps to take if suicide or homicide risk level increases between visits. While future psychiatric events cannot be accurately predicted, the patient does not currently require acute inpatient psychiatric care and does not currently meet [facility] involuntary commitment criteria.  Subjective:   Patient arrived on time for today's session. Discussed progress recent events.  She shared how she is coping with some pain levels -heart related and takes mediation as rx'd.  Provided support as she shared her grandmother's health status. Shared how she continues to cope w/ negative self concepts, these were discussed and continued to work w/ her from a cognitive behavioral framework, giving her homework assignment. Reviewed ways to manage her pain through diaphragmatic breathing / mindfulness.    Interventions: Cognitive Behavioral Therapy , supportive therapy, strength based approaches  Diagnosis:   ICD-10-CM   1. Bipolar II disorder ([place]) F31.81     Plan:  1.  Patient to continue to engage in individual counseling 2-4 times a month or as needed. 2.  Patient to identify and apply CBT, coping skills learned in session to decrease depression and anxiety symptoms. 3.  Patient to contact this office, go to the local ED or call 911 if a crisis or emergency develops between visits.   Anson [place], [facility]

## 2019-04-02 ENCOUNTER — Telehealth: Payer: Self-pay | Admitting: Mental Health

## 2019-04-02 NOTE — Telephone Encounter (Signed)
error 

## 2019-04-04 ENCOUNTER — Other Ambulatory Visit: Payer: Self-pay

## 2019-04-04 ENCOUNTER — Ambulatory Visit: Payer: 59 | Admitting: Mental Health

## 2019-04-06 ENCOUNTER — Ambulatory Visit (INDEPENDENT_AMBULATORY_CARE_PROVIDER_SITE_OTHER): Payer: 59 | Admitting: Physician Assistant

## 2019-04-06 ENCOUNTER — Encounter: Payer: Self-pay | Admitting: Physician Assistant

## 2019-04-06 ENCOUNTER — Other Ambulatory Visit: Payer: Self-pay

## 2019-04-06 DIAGNOSIS — R413 Other amnesia: Secondary | ICD-10-CM | POA: Diagnosis not present

## 2019-04-06 DIAGNOSIS — G4700 Insomnia, unspecified: Secondary | ICD-10-CM

## 2019-04-06 DIAGNOSIS — F3181 Bipolar II disorder: Secondary | ICD-10-CM | POA: Diagnosis not present

## 2019-04-06 DIAGNOSIS — G2581 Restless legs syndrome: Secondary | ICD-10-CM

## 2019-04-06 DIAGNOSIS — F411 Generalized anxiety disorder: Secondary | ICD-10-CM

## 2019-04-06 MED ORDER — CLONAZEPAM 0.5 MG PO TABS: 0.5000 mg | ORAL_TABLET | Freq: Three times a day (TID) | ORAL | 1 refills | Status: DC | PRN

## 2019-04-06 MED ORDER — MIRTAZAPINE 7.5 MG PO TABS: 7.5000 mg | ORAL_TABLET | Freq: Every day | ORAL | 0 refills | Status: DC

## 2019-04-06 NOTE — Progress Notes (Signed)
[facility] Check  Patient ID: Paula Elliott,  MRN: [id number]  PCP: Terald Sleeper, PA-C  Date of Evaluation: 04/06/2019 Time spent:15 minutes  Chief Complaint:  Chief Complaint    Follow-up     Virtual Visit via Telephone Note  I connected with patient by a video enabled telemedicine application or telephone, with their informed consent, and verified patient privacy and that I am speaking with the correct person using two identifiers.  I am private, in my home and the patient is home.  I discussed the limitations, risks, security and privacy concerns of performing an evaluation and management service by telephone and the availability of in person appointments. I also discussed with the patient that there may be a patient responsible charge related to this service. The patient expressed understanding and agreed to proceed.   I discussed the assessment and treatment plan with the patient. The patient was provided an opportunity to ask questions and all were answered. The patient agreed with the plan and demonstrated an understanding of the instructions.   The patient was advised to call back or seek an in-person evaluation if the symptoms worsen or if the condition fails to improve as anticipated.  I provided teen minutes of non-face-to-face time during this encounter.  HISTORY/CURRENT STATUS: HPI For routine med check.  Her GM she died on 04-04-2019.  Not really expected.  "I was really close to her. I can't tell if the meds are working or not because I am so sad."  The biggest problem is not being able to sleep well.  The trazodone does put her to sleep but she does not stay asleep.  She wakes up a lot and then does not feel rested when she gets up.  She still feels tired all the time.  Her memory is still bad also.  It is more short-term or maybe she forgets if she turned the curling iron off or lock the door when she left the house.  Things like that.  She does obsess a  little bit over those things and will often call her mother and asked her to check and make sure the curling iron was turned off.  She does not let it bother her to the point of taking time out of her day to return home and check.  Not really enjoying anything right now.  States it is hard though because of the shelter in place orders due to the coronavirus pandemic.  She is not able to get out and do anything that she normally likes to do.  She denies suicidal or homicidal thoughts.  Patient denies increased energy with decreased need for sleep, no increased talkativeness, no racing thoughts, no impulsivity or risky behaviors, no increased spending, no increased libido, no grandiosity.  Denies dizziness, syncope, seizures, numbness, tingling, tremor, tics, unsteady gait, slurred speech, confusion.  Denies muscle or joint pain, stiffness, or dystonia.  Individual Medical History/ Review of Systems: Changes? :No    Past medications for mental health diagnoses include: Wellbutrin, Xanax, Ativan, Provigil, Latuda, Lamictal, Abilify, Prozac, Luvox, Valium  Allergies: Patient has no known allergies.  Current Medications:  Current Outpatient Medications:  .  Armodafinil 250 MG tablet, Take 1 tablet (250 mg total) by mouth every morning., Disp: 30 tablet, Rfl: 2 .  clonazePAM (KLONOPIN) 0.5 MG tablet, Take 1 tablet (0.5 mg total) by mouth 3 (three) times daily as needed for anxiety., Disp: 90 tablet, Rfl: 1 .  DULoxetine (CYMBALTA) 60 MG capsule, Take  2 capsules (120 mg total) by mouth daily., Disp: 60 capsule, Rfl: 2 .  gabapentin (NEURONTIN) 300 MG capsule, Take 1 capsule (300 mg total) by mouth 2 (two) times daily., Disp: 60 capsule, Rfl: 2 .  hydrOXYzine (ATARAX/VISTARIL) 25 MG tablet, Take 1 tablet (25 mg total) by mouth every 8 (eight) hours as needed., Disp: 90 tablet, Rfl: 2 .  metFORMIN (GLUCOPHAGE) 1000 MG tablet, Take 1 tablet (1,000 mg total) by mouth daily. with food, Disp: 30 tablet,  Rfl: 2 .  minocycline (MINOCIN,DYNACIN) 50 MG capsule, Take by mouth daily., Disp: , Rfl: 3 .  QUEtiapine (SEROQUEL) 300 MG tablet, Take 2 tablets (600 mg total) by mouth at bedtime., Disp: 60 tablet, Rfl: 2 .  vitamin B-12 (CYANOCOBALAMIN) 500 MCG tablet, Take 1 tablet (500 mcg total) by mouth daily., Disp: 30 tablet, Rfl: 2 .  HYDROcodone-homatropine (HYCODAN) 5-1.5 MG/5ML syrup, Take 5-10 mLs by mouth every 6 (six) hours as needed. (Patient not taking: Reported on 04/06/2019), Disp: 240 mL, Rfl: 0 .  mirtazapine (REMERON) 7.5 MG tablet, Take 1-2 tablets (7.5-15 mg total) by mouth at bedtime., Disp: 60 tablet, Rfl: 0 Medication Side Effects: none  Family Medical/ Social History: Changes?  Grandmother passed away earlier this week.  Coronavirus pandemic and shelter in place orders.  MENTAL HEALTH EXAM:  There were no vitals taken for this visit.There is no height or weight on file to calculate BMI.  General Appearance: unable to assess  Eye Contact:  unable to assess  Speech:  Clear and Coherent  Volume:  Normal  Mood:  Euthymic  Affect:  Unable to assess  Thought Process:  Goal Directed  Orientation:  Full (Time, Place, and Person)  Thought Content: Logical   Suicidal Thoughts:  No  Homicidal Thoughts:  No  Memory:  Immediate;   Fair Recent;   Fair Remote;   Good  Judgement:  Intact  Insight:  Good  Psychomotor Activity:  Unable to assess  Concentration:  Concentration: Good and Attention Span: Fair  Recall:  Good  Fund of Knowledge: Good  Language: Good  Assets:  Desire for Improvement  ADL's:  Intact  Cognition: WNL  Prognosis:  Good    DIAGNOSES:    ICD-10-CM   1. Insomnia, unspecified type G47.00   2. Bipolar II disorder ([place]) F31.81   3. Generalized anxiety disorder F41.1   4. Memory deficit R41.3   5. Restless leg syndrome G25.81     Receiving Psychotherapy: Yes With [name], LPC.  RECOMMENDATIONS: We discussed different options for treatment.  As for  her memory, I think we should continue to observe.  My hope is that when the depression is better, her memory will improve.  I do not want to change too many things at once here and I think it is most important to help her sleep now, which may also improve her memory. Another issue is the sedating medication she is on.  These may decrease her memory as well.  Once again, I do not want to change anything right now. DC trazodone. Start Remeron 7.5 mg 1-2 nightly as needed sleep. Continue armodafinil 250 mg every morning. Continue Klonopin 0.5 mg 3 times daily as needed. Continue Cymbalta 60 mg 2 daily. Continue gabapentin 300 mg twice daily. Continue hydroxyzine 25 mg every 8 hours PRN. Continue Seroquel 300 mg, 2 nightly. Continue B12 daily. Continue psychotherapy with [name], LPC. Return in 4 to 6 weeks.   Donnal Moat, PA-C   This record  has been created using [employer].  Chart creation errors have been sought, but may not always have been located and corrected. Such creation errors do not reflect on the standard of medical care.

## 2019-04-11 ENCOUNTER — Ambulatory Visit (INDEPENDENT_AMBULATORY_CARE_PROVIDER_SITE_OTHER): Payer: 59 | Admitting: Mental Health

## 2019-04-11 DIAGNOSIS — F3181 Bipolar II disorder: Secondary | ICD-10-CM

## 2019-04-11 NOTE — Progress Notes (Signed)
      [facility] Counselor/Therapist Progress Note   Patient ID: Paula Elliott,  MRN: [id number] Date:  04/11/19 Timespent: 57 minutes  Treatment Type: Individual  Virtual Visit via Telephone Note Connected with patient by a video enabled telemedicine/telehealth application or telephone, with their informed consent, and verified patient privacy and that I am speaking with the correct person using two identifiers. I discussed the limitations, risks, security and privacy concerns of performing psychotherapy and management service by telephone and the availability of in person appointments. I also discussed with the patient that there may be a patient responsible charge related to this service. The patient expressed understanding and agreed to proceed. I discussed the treatment planning with the patient. The patient was provided an opportunity to ask questions and all were answered. The patient agreed with the plan and demonstrated an understanding of the instructions. The patient was advised to call  our office if  symptoms worsen or feel they are in a crisis state and need immediate contact.  Therapist Location: home Patient Location: home  Mental Status Exam:    Appearance:   UTA - Unable to assess  Behavior:  UTA  Motor:  UTA  Speech/Language:   Clear and Coherent  Affect:  UTA  Mood:  Anxious, depressed  Thought process:  normal  Thought content:   WNL, denies SI/HI  Sensory/Perceptual disturbances:   none  Orientation:  oriented to person, place and time/date  Attention:  Good  Concentration:  Good  Memory:  WNL  Fund of knowledge:   Good  Insight:   developing  Judgment:   developing  Impulse Control:  developing   Reported Symptoms: Depressed mood most days, anxiety, rumination, hx of mood swings, crying spells  Risk Assessment: Danger to Self: No Self-injurious Behavior: Yes (superficial) Danger to Others: No Duty to  Warn:no Physical Aggression / Violence:No  Access to Firearms a concern: No  Gang Involvement:No  Patient / guardian was educated about steps to take if suicide or homicide risk level increases between visits. While future psychiatric events cannot be accurately predicted, the patient does not currently require acute inpatient psychiatric care and does not currently meet [facility] involuntary commitment criteria.  Subjective:   Patient arrived on time for today's session. Discussed her recently coping w/ the loss   She shared how she is coping with some pain levels -heart related and takes mediation as rx'd.  Provided support as she shared her grandmother's health status. Shared how she continues to cope w/ negative self concepts, these were discussed and continued to work w/ her from a cognitive behavioral framework, giving her homework assignment. Reviewed ways to manage her pain through diaphragmatic breathing / mindfulness.    Interventions: Cognitive Behavioral Therapy , supportive therapy, strength based approaches  Diagnosis:   ICD-10-CM   1. Bipolar II disorder ([place]) F31.81     Plan:  1.  Patient to continue to engage in individual counseling 2-4 times a month or as needed. 2.  Patient to identify and apply CBT, coping skills learned in session to decrease depression and anxiety symptoms. 3.  Patient to contact this office, go to the local ED or call 911 if a crisis or emergency develops between visits.   Anson [place], [facility]

## 2019-04-18 ENCOUNTER — Ambulatory Visit: Payer: 59 | Admitting: Mental Health

## 2019-04-18 ENCOUNTER — Other Ambulatory Visit: Payer: Self-pay

## 2019-04-25 ENCOUNTER — Ambulatory Visit (INDEPENDENT_AMBULATORY_CARE_PROVIDER_SITE_OTHER): Payer: 59 | Admitting: Mental Health

## 2019-04-25 ENCOUNTER — Other Ambulatory Visit: Payer: Self-pay

## 2019-04-25 DIAGNOSIS — F3181 Bipolar II disorder: Secondary | ICD-10-CM | POA: Diagnosis not present

## 2019-04-29 NOTE — Progress Notes (Addendum)
      [facility] Counselor/Therapist Progress Note   Patient ID: Paula Elliott,  MRN: [id number] Date:  04/25/19 Timespent: 59 minutes  Treatment Type: Individual  Virtual Visit via Telephone Note Connected with patient by a video enabled telemedicine/telehealth application or telephone, with their informed consent, and verified patient privacy and that I am speaking with the correct person using two identifiers. I discussed the limitations, risks, security and privacy concerns of performing psychotherapy and management service by telephone and the availability of in person appointments. I also discussed with the patient that there may be a patient responsible charge related to this service. The patient expressed understanding and agreed to proceed. I discussed the treatment planning with the patient. The patient was provided an opportunity to ask questions and all were answered. The patient agreed with the plan and demonstrated an understanding of the instructions. The patient was advised to call  our office if  symptoms worsen or feel they are in a crisis state and need immediate contact.  Therapist Location: home Patient Location: home  Mental Status Exam:    Appearance:   UTA - Unable to assess  Behavior:  UTA  Motor:  UTA  Speech/Language:   Clear and Coherent  Affect:  UTA  Mood:  Anxious, depressed  Thought process:  normal  Thought content:   WNL, denies SI/HI  Sensory/Perceptual disturbances:   none  Orientation:  oriented to person, place and time/date  Attention:  Good  Concentration:  Good  Memory:  WNL  Fund of knowledge:   Good  Insight:   developing  Judgment:   developing  Impulse Control:  developing   Reported Symptoms: Depressed mood most days, anxiety, rumination, hx of mood swings, crying spells  Risk Assessment: Danger to Self: No Self-injurious Behavior: Yes (superficial) Danger to Others: No Duty to  Warn:no Physical Aggression / Violence:No  Access to Firearms a concern: No  Gang Involvement:No  Patient / guardian was educated about steps to take if suicide or homicide risk level increases between visits. While future psychiatric events cannot be accurately predicted, the patient does not currently require acute inpatient psychiatric care and does not currently meet [facility] involuntary commitment criteria.  Subjective:   Patient arrived on time for today's session.  Patient continues to grieve the loss of her grandmother.  How this is affected her and her family was discussed.  Continue to provide support assisting her in processing feelings related to the loss.  Patient went on to focus on interpersonal issues related to her self-esteem giving examples over the years.  Challenged patient to examine experiences and negative self talk assisting her throughout the session.  Reviewed coping.  Continue to work with patient from a cognitive behavioral framework.  Encouraged her to follow through on homework between sessions.  Interventions: Cognitive Behavioral Therapy , supportive therapy, strength based approaches  Diagnosis:   ICD-10-CM   1. Bipolar II disorder ([place]) F31.81     Plan:  1.  Patient to continue to engage in individual counseling 2-4 times a month or as needed. 2.  Patient to identify and apply CBT, coping skills learned in session to decrease depression and anxiety symptoms. 3.  Patient to contact this office, go to the local ED or call 911 if a crisis or emergency develops between visits.   Anson [place], [facility]

## 2019-05-02 ENCOUNTER — Other Ambulatory Visit: Payer: Self-pay

## 2019-05-02 ENCOUNTER — Ambulatory Visit (INDEPENDENT_AMBULATORY_CARE_PROVIDER_SITE_OTHER): Payer: 59 | Admitting: Mental Health

## 2019-05-03 ENCOUNTER — Ambulatory Visit (INDEPENDENT_AMBULATORY_CARE_PROVIDER_SITE_OTHER): Payer: 59 | Admitting: Mental Health

## 2019-05-03 ENCOUNTER — Other Ambulatory Visit: Payer: Self-pay

## 2019-05-03 DIAGNOSIS — F3181 Bipolar II disorder: Secondary | ICD-10-CM | POA: Diagnosis not present

## 2019-05-09 ENCOUNTER — Other Ambulatory Visit: Payer: Self-pay

## 2019-05-09 ENCOUNTER — Ambulatory Visit (INDEPENDENT_AMBULATORY_CARE_PROVIDER_SITE_OTHER): Payer: 59 | Admitting: Mental Health

## 2019-05-09 DIAGNOSIS — F3181 Bipolar II disorder: Secondary | ICD-10-CM

## 2019-05-10 ENCOUNTER — Telehealth: Payer: Self-pay | Admitting: Mental Health

## 2019-05-10 NOTE — Progress Notes (Addendum)
Note in error.

## 2019-05-10 NOTE — Telephone Encounter (Signed)
Left message for patient to call our office back to reschedule missed appointment.

## 2019-05-10 NOTE — Progress Notes (Signed)
      [facility] Counselor/Therapist Progress Note   Patient ID: Paula Elliott,  MRN: [id number] Date:  05/09/19 Timespent: 62 minutes  Treatment Type: Individual  Mental Status Exam:    Appearance:   Casual, appropriate  Behavior:  WNL  Motor:  WNL  Speech/Language:   Clear and Coherent  Affect:  WNL  Mood:  Anxious, depressed  Thought process:  normal  Thought content:   WNL, denies SI/HI  Sensory/Perceptual disturbances:   none  Orientation:  oriented to person, place and time/date  Attention:  Good  Concentration:  Good  Memory:  WNL  Fund of knowledge:   Good  Insight:   developing  Judgment:   developing  Impulse Control:  developing   Reported Symptoms: Depressed mood most days, anxiety, rumination, hx of mood swings, crying spells  Risk Assessment: Danger to Self: No Self-injurious Behavior: Yes (superficial) Danger to Others: No Duty to Warn:no Physical Aggression / Violence:No  Access to Firearms a concern: No  Gang Involvement:No  Patient / guardian was educated about steps to take if suicide or homicide risk level increases between visits. While future psychiatric events cannot be accurately predicted, the patient does not currently require acute inpatient psychiatric care and does not currently meet [facility] involuntary commitment criteria.  Subjective:   Patient arrived on time for today's session.  She shared recent progress, continues to work at her full-time job.  Shared how she has some continued stress at home related to the relationship she has with her parents more specifically with her mother.  Time spent allowing patient to process feelings related to this relationship.  She shared how she feels she struggles with her identity in terms of her sense of independence giving examples of how her parents appear to enable her from taking more on more responsibilities for herself.  Ways to communicate were explored.   Continue to provide support, understanding working with patient from a cognitive behavioral framework.  Patient was encouraged to identify both short and long-term goals related to increasing her independence.  reviewed coping.    Interventions: Cognitive Behavioral Therapy , supportive therapy, strength based approaches  Diagnosis:   ICD-10-CM   1. Bipolar II disorder ([place])  F31.81     Plan:  1.  Patient to continue to engage in individual counseling 2-4 times a month or as needed. 2.  Patient to identify and apply CBT, coping skills learned in session to decrease depression and anxiety symptoms. 3.  Patient to contact this office, go to the local ED or call 911 if a crisis or emergency develops between visits.   Anson [place], [facility]

## 2019-05-14 ENCOUNTER — Other Ambulatory Visit: Payer: Self-pay | Admitting: Physician Assistant

## 2019-05-15 NOTE — Progress Notes (Signed)
[facility] Counselor/Therapist Progress Note   Patient ID: Paula Elliott,  MRN: [id number] Date:  05/03/19 Timespent: 58 minutes  Treatment Type: Individual  Virtual Visit via Telephone Note Connected with patient by a video enabled telemedicine/telehealth application or telephone, with their informed consent, and verified patient privacy and that I am speaking with the correct person using two identifiers. I discussed the limitations, risks, security and privacy concerns of performing psychotherapy and management service by telephone and the availability of in person appointments. I also discussed with the patient that there may be a patient responsible charge related to this service. The patient expressed understanding and agreed to proceed. I discussed the treatment planning with the patient. The patient was provided an opportunity to ask questions and all were answered. The patient agreed with the plan and demonstrated an understanding of the instructions. The patient was advised to call  our office if  symptoms worsen or feel they are in a crisis state and need immediate contact.  Therapist Location: home Patient Location: home  Mental Status Exam:    Appearance:   UTA - Unable to assess  Behavior:  UTA  Motor:  UTA  Speech/Language:   Clear and Coherent  Affect:  UTA  Mood:  Anxious, depressed  Thought process:  normal  Thought content:   WNL, denies SI/HI  Sensory/Perceptual disturbances:   none  Orientation:  oriented to person, place and time/date  Attention:  Good  Concentration:  Good  Memory:  WNL  Fund of knowledge:   Good  Insight:   developing  Judgment:   developing  Impulse Control:  developing   Reported Symptoms: Depressed mood most days, anxiety, rumination, hx of mood swings, crying spells  Risk Assessment: Danger to Self: No Self-injurious Behavior: Yes (superficial) Danger to Others: No Duty to Warn:no Physical  Aggression / Violence:No  Access to Firearms a concern: No  Gang Involvement:No  Patient / guardian was educated about steps to take if suicide or homicide risk level increases between visits. While future psychiatric events cannot be accurately predicted, the patient does not currently require acute inpatient psychiatric care and does not currently meet [facility] involuntary commitment criteria.  Subjective:   Patient arrived on time for today's session.  Discussed progress.  She continues to work full-time, however customers were not entering her workplace.  She stated that she continues to work with 1 other employee and some continue to slowly return to work due to the viral pandemic.  She continues to consider other employment options as she is on probation at her current job and is well and possibly in the next few months.  She continues to process family issues as well as interpersonal issues.  Cites that she has a low self-esteem, self-worth.  Assisted patient in processing feelings related as well as assisted her in identifying personal strengths in session.  This was difficult for her, often minimizing but was able to acknowledge some personal strengths.  Continue to work with patient from a cognitive behavioral framework.  Encouraged her to follow through on homework between sessions.  Interventions: Cognitive Behavioral Therapy , supportive therapy, strength based approaches  Diagnosis:   ICD-10-CM   1. Bipolar II disorder ([place])  F31.81     Plan:  1.  Patient to continue to engage in individual counseling 2-4 times a month or as needed. 2.  Patient to identify and apply CBT, coping skills learned in session to decrease depression and anxiety symptoms. 3.  Patient to  contact this office, go to the local ED or call 911 if a crisis or emergency develops between visits.   Anson [place], [facility]

## 2019-05-16 ENCOUNTER — Ambulatory Visit: Payer: 59 | Admitting: Mental Health

## 2019-05-23 ENCOUNTER — Ambulatory Visit (INDEPENDENT_AMBULATORY_CARE_PROVIDER_SITE_OTHER): Payer: 59 | Admitting: Mental Health

## 2019-05-23 ENCOUNTER — Ambulatory Visit (INDEPENDENT_AMBULATORY_CARE_PROVIDER_SITE_OTHER): Payer: 59

## 2019-05-23 ENCOUNTER — Other Ambulatory Visit: Payer: Self-pay

## 2019-05-23 DIAGNOSIS — Q249 Congenital malformation of heart, unspecified: Secondary | ICD-10-CM

## 2019-05-23 DIAGNOSIS — R079 Chest pain, unspecified: Secondary | ICD-10-CM

## 2019-05-23 DIAGNOSIS — F3181 Bipolar II disorder: Secondary | ICD-10-CM

## 2019-05-23 NOTE — Progress Notes (Signed)
      [facility] Counselor/Therapist Progress Note   Patient ID: Paula Elliott,  MRN: [id number] Date:  05/23/19 Timespent: 61 minutes  Treatment Type: Individual  Mental Status Exam:    Appearance:   Casual, appropriate  Behavior:  WNL  Motor:  WNL  Speech/Language:   Clear and Coherent  Affect:  WNL  Mood:  Anxious, depressed  Thought process:  normal  Thought content:   WNL, denies SI/HI  Sensory/Perceptual disturbances:   none  Orientation:  oriented to person, place and time/date  Attention:  Good  Concentration:  Good  Memory:  WNL  Fund of knowledge:   Good  Insight:   developing  Judgment:   developing  Impulse Control:  developing   Reported Symptoms: Depressed mood most days, anxiety, rumination, hx of mood swings, crying spells  Risk Assessment: Danger to Self: No Self-injurious Behavior: Yes (superficial) Danger to Others: No Duty to Warn:no Physical Aggression / Violence:No  Access to Firearms a concern: No  Gang Involvement:No  Patient / guardian was educated about steps to take if suicide or homicide risk level increases between visits. While future psychiatric events cannot be accurately predicted, the patient does not currently require acute inpatient psychiatric care and does not currently meet [facility] involuntary commitment criteria.  Subjective:   Patient arrived on time for today's session.  She shared recent progress, how her parents went over recent trip and she had some downtime, independence at home.  She shared that she had a depressive episode about a week ago without an identified trigger.  She shared how she engaged in a echocardiogram and awaits results.  Processed thoughts and feelings related to how her parents marginalized her concerns related to her cardiac symptoms and mental health symptoms, specifically around anxiety.  Primary relational strain is between patient and her mother as patient  shared many examples and details related to their communication.  Patient has been able to be more assertive, setting boundaries with her parents to gain more independence.  Patient ultimately would like to move out to gain her independence but was able to recognize that she is taking steps.  Ways to communicate were explored.  Continue to provide support, understanding working with patient from a cognitive behavioral framework.    Interventions: Cognitive Behavioral Therapy , supportive therapy, strength based approaches  Diagnosis: No diagnosis found.  Plan:  1.  Patient to continue to engage in individual counseling 2-4 times a month or as needed. 2.  Patient to identify and apply CBT, coping skills learned in session to decrease depression and anxiety symptoms. 3.  Patient to contact this office, go to the local ED or call 911 if a crisis or emergency develops between visits.   Anson [place], [facility]

## 2019-05-24 ENCOUNTER — Encounter: Payer: Self-pay | Admitting: Physician Assistant

## 2019-05-24 ENCOUNTER — Ambulatory Visit: Payer: 59 | Admitting: Physician Assistant

## 2019-05-24 VITALS — BP 111/87 | HR 108

## 2019-05-24 DIAGNOSIS — G4700 Insomnia, unspecified: Secondary | ICD-10-CM

## 2019-05-24 DIAGNOSIS — F319 Bipolar disorder, unspecified: Secondary | ICD-10-CM

## 2019-05-24 DIAGNOSIS — F411 Generalized anxiety disorder: Secondary | ICD-10-CM | POA: Diagnosis not present

## 2019-05-24 DIAGNOSIS — F900 Attention-deficit hyperactivity disorder, predominantly inattentive type: Secondary | ICD-10-CM

## 2019-05-24 DIAGNOSIS — R413 Other amnesia: Secondary | ICD-10-CM | POA: Diagnosis not present

## 2019-05-24 MED ORDER — QUETIAPINE FUMARATE 400 MG PO TABS: 800.0000 mg | ORAL_TABLET | Freq: Every day | ORAL | 2 refills | Status: DC

## 2019-05-24 MED ORDER — DULOXETINE HCL 60 MG PO CPEP: 120.0000 mg | ORAL_CAPSULE | Freq: Every day | ORAL | 2 refills | Status: DC

## 2019-05-24 MED ORDER — ZOLPIDEM TARTRATE 10 MG PO TABS: 10.0000 mg | ORAL_TABLET | Freq: Every evening | ORAL | 0 refills | Status: DC | PRN

## 2019-05-24 MED ORDER — CLONAZEPAM 0.5 MG PO TABS: 0.5000 mg | ORAL_TABLET | Freq: Four times a day (QID) | ORAL | 2 refills | Status: DC | PRN

## 2019-05-24 MED ORDER — GABAPENTIN 300 MG PO CAPS: 300.0000 mg | ORAL_CAPSULE | Freq: Three times a day (TID) | ORAL | 2 refills | Status: DC

## 2019-05-24 MED ORDER — CLONIDINE HCL 0.1 MG PO TABS: ORAL_TABLET | ORAL | 2 refills | Status: DC

## 2019-05-24 NOTE — Progress Notes (Signed)
[facility] Check  Patient ID: Paula Elliott,  MRN: [id number]  PCP: Terald Sleeper, PA-C  Date of Evaluation: 05/24/2019 Time spent:25 minutes  Chief Complaint:  Chief Complaint    Anxiety; Depression; Insomnia      HISTORY/CURRENT STATUS: HPI for routine follow-up.  Several problems: Armodafanil-not helping focus or giving her energy.  She's been on it several years and it doesn't help memory.  The Modafanil didn't help either.  "I need help focusing.  I can't remember things, even if someone tells me their name, I forget it in 2 seconds."  Ask about Adderall or some drug like that.  Anxiety is a lot worse too.  Needing more Klonopin. No trigger.  Taking Klonopin 4 times a day.  It does help but not a whole lot.  She has been more nervous about her job.  It is very stressful.  Also concerned about her own physical health.  Trouble going to sleep and staying asleep, even with the sleep meds.  Might get 3 to 4 hours a night and feels very tired the next day.  States that her parents are not very understanding about her concerns.  They tell her she does not have anything to be anxious or depressed about.  She never really feels like doing anything.  Her energy and motivation are low.  On the Cymbalta, the pharmacy continues to give her 30 mg pills with directions of taking 3 although we have increased the dose to 120 mg. Denies suicidal or homicidal thoughts.  Patient denies increased energy with decreased need for sleep, no increased talkativeness, no racing thoughts, no impulsivity or risky behaviors, has had increased spending, no increased libido, no grandiosity.  She does get irritable easily.  Denies dizziness, syncope, seizures, numbness, tingling, tremor, tics, unsteady gait, slurred speech, confusion. Denies muscle or joint pain, stiffness, or dystonia.  Individual Medical History/ Review of Systems: Changes? :No    Past medications for mental health  diagnoses include: Wellbutrin, Xanax, Ativan, Provigil, Latuda, Lamictal, Abilify, Prozac, Luvox, Valium  Allergies: Patient has no known allergies.  Current Medications:  Current Outpatient Medications:  .  clonazePAM (KLONOPIN) 0.5 MG tablet, Take 1 tablet (0.5 mg total) by mouth 4 (four) times daily as needed for anxiety., Disp: 120 tablet, Rfl: 2 .  DULoxetine (CYMBALTA) 60 MG capsule, Take 2 capsules (120 mg total) by mouth daily., Disp: 60 capsule, Rfl: 2 .  gabapentin (NEURONTIN) 300 MG capsule, Take 1 capsule (300 mg total) by mouth 3 (three) times daily., Disp: 90 capsule, Rfl: 2 .  metFORMIN (GLUCOPHAGE) 1000 MG tablet, Take 1 tablet (1,000 mg total) by mouth daily. with food, Disp: 30 tablet, Rfl: 2 .  minocycline (MINOCIN,DYNACIN) 50 MG capsule, Take by mouth daily., Disp: , Rfl: 3 .  vitamin B-12 (CYANOCOBALAMIN) 500 MCG tablet, Take 1 tablet (500 mcg total) by mouth daily., Disp: 30 tablet, Rfl: 2 .  cloNIDine (CATAPRES) 0.1 MG tablet, 1 po qhs for 4 nights then 2 qhs, Disp: 60 tablet, Rfl: 2 .  HYDROcodone-homatropine (HYCODAN) 5-1.5 MG/5ML syrup, Take 5-10 mLs by mouth every 6 (six) hours as needed. (Patient not taking: Reported on 04/06/2019), Disp: 240 mL, Rfl: 0 .  QUEtiapine (SEROQUEL) 400 MG tablet, Take 2 tablets (800 mg total) by mouth at bedtime., Disp: 60 tablet, Rfl: 2 .  zolpidem (AMBIEN) 10 MG tablet, Take 1 tablet (10 mg total) by mouth at bedtime as needed for up to 30 days for sleep., Disp: 30 tablet,  Rfl: 0 Medication Side Effects: difficulty swallowing when takes Trazodone  Family Medical/ Social History: Changes? No  MENTAL HEALTH EXAM:  Blood pressure 111/87, pulse (!) 108.There is no height or weight on file to calculate BMI.  General Appearance: Casual and Obese multiple tattoos  Eye Contact:  Good  Speech:  Clear and Coherent  Volume:  Normal  Mood:  Euthymic  Affect:  Appropriate  Thought Process:  Goal Directed  Orientation:  Full (Time, Place, and  Person)  Thought Content: Logical   Suicidal Thoughts:  No  Homicidal Thoughts:  No  Memory:  WNL  Judgement:  Good  Insight:  Good  Psychomotor Activity:  Normal  Concentration:  Concentration: Fair and Attention Span: Fair  Recall:  Good  Fund of Knowledge: Good  Language: Good  Assets:  Desire for Improvement  ADL's:  Intact  Cognition: WNL  Prognosis:  Good    DIAGNOSES:    ICD-10-CM   1. Bipolar I disorder ([place])  F31.9   2. Insomnia, unspecified type  G47.00   3. Generalized anxiety disorder  F41.1   4. Memory deficit  R41.3   5. Attention deficit hyperactivity disorder (ADHD), predominantly inattentive type  F90.0     Receiving Psychotherapy: Yes With [name], LPC.   RECOMMENDATIONS:  We discussed the fact that because she is on a benzodiazepine and definitely needs it, we are not able to prescribe a stimulant as well.  Other options could be clonidine and Strattera, Intuniv is also another option.  I believe clonidine would be a good option because it is not only for attention and focus but can help with anxiety as well.  She understands and agrees. Start clonidine 0.1 mg nightly for 4 nights and then may increase to 2 nightly.  She knows to watch for dizziness especially within an hour or so after taking it.  She should go straight to bed however the first few nights.  She does not have to increase it if she is feeling better on 0.1 mg. Continue Klonopin 0.5 mg 1 4 times daily as needed. Weeks Cymbalta 60 mg to 2 daily. Increase gabapentin 300 mg 1 3 times daily. Increase Seroquel 400 mg, 2 nightly. Start Ambien 10 mg 1 nightly as needed. We discussed these many changes in her medication but I feel like they are necessary at this point.  She is desperate to feel better.  She understands to let me know if there are any side effects. I am keeping her out of work until 05/28/2019. Continue psychotherapy with [name], LPC. Return in 4 weeks.  Donnal Moat,  PA-C   This record has been created using [employer].  Chart creation errors have been sought, but may not always have been located and corrected. Such creation errors do not reflect on the standard of medical care.

## 2019-05-25 ENCOUNTER — Telehealth: Payer: Self-pay | Admitting: *Deleted

## 2019-05-25 DIAGNOSIS — Z01812 Encounter for preprocedural laboratory examination: Secondary | ICD-10-CM

## 2019-05-25 DIAGNOSIS — Q249 Congenital malformation of heart, unspecified: Secondary | ICD-10-CM

## 2019-05-25 NOTE — Telephone Encounter (Signed)
She agrees to doing cardiac mri & referral.  She will do BMET at [facility] a few days prior to test.  Lab order faxed now.

## 2019-05-25 NOTE — Telephone Encounter (Signed)
Notes recorded by Laurine Blazer, LPN on 03/02/5247 at 1:85 PM EDT  Left message to return call. 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 Obtain cardiac MRI. Needs to follow up with adult congenital heart disease specialist. I would recommend Dr. Jeralyn Bennett with [facility] at his [place] office if possible.

## 2019-05-25 NOTE — Telephone Encounter (Signed)
Notes recorded by Laurine Blazer, LPN on 05/26/2416 at 5:30 PM EDT  Patient notified. Copy to pmd. Follow up scheduled for September w/ Dr. Bronson Ing.

## 2019-05-30 ENCOUNTER — Telehealth: Payer: Self-pay | Admitting: Cardiovascular Disease

## 2019-05-30 ENCOUNTER — Telehealth: Payer: Self-pay | Admitting: *Deleted

## 2019-05-30 DIAGNOSIS — Q249 Congenital malformation of heart, unspecified: Secondary | ICD-10-CM

## 2019-05-30 NOTE — Telephone Encounter (Signed)
Need new order and call [facility]- to schedule [phone]

## 2019-05-30 NOTE — Telephone Encounter (Signed)
LM to return call.

## 2019-05-30 NOTE — Telephone Encounter (Signed)
Pre-cert Verification for the following procedure    MR CARD MORPHOLOGY WO/W CM scheduled for 06/05/2019 at [facility].

## 2019-05-30 NOTE — Telephone Encounter (Signed)
-----   Message from Herminio Commons, MD sent at 05/30/2019  2:19 PM EDT ----- Normal

## 2019-06-04 ENCOUNTER — Telehealth: Payer: Self-pay | Admitting: Cardiovascular Disease

## 2019-06-04 NOTE — Telephone Encounter (Signed)
Note sent to Satira Sark for percert of MR Card Morphology -Once percerted patient will be contacted. Spoke with patient to let her know that we are waiting on percert.

## 2019-06-05 ENCOUNTER — Ambulatory Visit (HOSPITAL_COMMUNITY): Payer: 59

## 2019-06-05 ENCOUNTER — Telehealth: Payer: Self-pay | Admitting: Physician Assistant

## 2019-06-05 NOTE — Telephone Encounter (Signed)
Requesting percert for MR Card Morphology

## 2019-06-05 NOTE — Telephone Encounter (Signed)
Patient would like for you to call her regarding FMLA forms

## 2019-06-05 NOTE — Telephone Encounter (Signed)
Please triage

## 2019-06-05 NOTE — Telephone Encounter (Signed)
Do you have these forms?

## 2019-06-06 ENCOUNTER — Ambulatory Visit (INDEPENDENT_AMBULATORY_CARE_PROVIDER_SITE_OTHER): Payer: 59 | Admitting: Mental Health

## 2019-06-06 ENCOUNTER — Other Ambulatory Visit: Payer: Self-pay

## 2019-06-06 ENCOUNTER — Telehealth: Payer: Self-pay | Admitting: Cardiovascular Disease

## 2019-06-06 DIAGNOSIS — F319 Bipolar disorder, unspecified: Secondary | ICD-10-CM

## 2019-06-06 NOTE — Telephone Encounter (Signed)
Forward to provider for his opinion.

## 2019-06-06 NOTE — Telephone Encounter (Signed)
The pt. Has them and she will drop them off today. What she is requesting is that she gets FMLA for appt's. She see's Helene Kelp once a month and Gerald Stabs once a week. She needs it as a job protection and she needs to keep her appointments. 1-2 days a month should be fine.

## 2019-06-06 NOTE — Telephone Encounter (Signed)
Still awaiting for MRI to be scheduled at [facility]. Patient is wanting to know why she is being referred to [place].

## 2019-06-07 NOTE — Telephone Encounter (Signed)
I have had good prior experiences with Dr. Jeralyn Bennett. That being said, if she prefers to follow with an adult congenital specialist there, that is entirely up to her.

## 2019-06-07 NOTE — Telephone Encounter (Signed)
Patient notified and verbalized understanding. 

## 2019-06-08 ENCOUNTER — Encounter: Payer: Self-pay | Admitting: *Deleted

## 2019-06-08 NOTE — Progress Notes (Signed)
      [facility] Counselor/Therapist Progress Note   Patient ID: Paula Elliott,  MRN: [id number] Date:  06/06/19 Timespent: 60 minutes  Treatment Type: Individual  Mental Status Exam:    Appearance:   Casual, appropriate  Behavior:  WNL  Motor:  WNL  Speech/Language:   Clear and Coherent  Affect:  WNL  Mood:  Anxious, depressed  Thought process:  normal  Thought content:   WNL, denies SI/HI  Sensory/Perceptual disturbances:   none  Orientation:  oriented to person, place and time/date  Attention:  Good  Concentration:  Good  Memory:  WNL  Fund of knowledge:   Good  Insight:   developing  Judgment:   developing  Impulse Control:  developing   Reported Symptoms: Depressed mood most days, anxiety, rumination, hx of mood swings, crying spells  Risk Assessment: Danger to Self: No Self-injurious Behavior: Yes (superficial) Danger to Others: No Duty to Warn:no Physical Aggression / Violence:No  Access to Firearms a concern: No  Gang Involvement:No  Patient / guardian was educated about steps to take if suicide or homicide risk level increases between visits. While future psychiatric events cannot be accurately predicted, the patient does not currently require acute inpatient psychiatric care and does not currently meet [facility] involuntary commitment criteria.  Subjective:   Patient arrived on time for today's session.  He shared recent stressors with a focus on her family relationships.  She stated that her grandfather spends time with him daily since the loss of her grandmother.  She feels she continues to have limited support regarding the grief process.  Shared specifically how her mother can be supportive although often tends to focus on herself.  Patient shared how both her parents often do not acknowledge her health issues.  She shared some content from her recent discussion with her mother where she stated her mother became  defensive when patient confronted her about being dismissive of her medical issues.  Patient is aware that she is doing this as a way to cope as she knows her mother loves and cares for her, however, patient stated that this is a theme that her father and mother have consistently returned to throughout the years specifically regarding her mental health.  She shared her father has had some significant mental health issues in the past but lacks supporting her emotionally.  She shared he makes critical comments and often uses sarcastic humor which patient feels is hurtful.  We continue to explore ways to cope and care for herself as well as to communicate effectively.  Continue to provide support, understanding working with patient from a cognitive behavioral framework.    Interventions: Cognitive Behavioral Therapy , supportive therapy, strength based approaches  Diagnosis:   ICD-10-CM   1. Bipolar I disorder ([place])  F31.9     Plan:  1.  Patient to continue to engage in individual counseling 2-4 times a month or as needed. 2.  Patient to identify and apply CBT, coping skills learned in session to decrease depression and anxiety symptoms. 3.  Patient to contact this office, go to the local ED or call 911 if a crisis or emergency develops between visits.   Anson [place], [facility]

## 2019-06-08 NOTE — Telephone Encounter (Signed)
My Chart message sent

## 2019-06-11 ENCOUNTER — Telehealth: Payer: Self-pay | Admitting: Cardiovascular Disease

## 2019-06-11 NOTE — Telephone Encounter (Signed)
Asking for a sedative to take before her test.  Stated she has really bad anxiety

## 2019-06-11 NOTE — Telephone Encounter (Signed)
Patient informed to take one clonazepam before her test.  Patient asked if pain medications could be prescribed besides tylenol. Advised that tylenol is recommended. Verbalized understanding.

## 2019-06-11 NOTE — Telephone Encounter (Signed)
Looks like she already has Klonopin to be taken prn.

## 2019-06-12 ENCOUNTER — Encounter: Payer: Self-pay | Admitting: *Deleted

## 2019-06-12 ENCOUNTER — Telehealth: Payer: Self-pay | Admitting: Cardiovascular Disease

## 2019-06-12 NOTE — Telephone Encounter (Signed)
Work note provided. Available in mychart.

## 2019-06-12 NOTE — Telephone Encounter (Signed)
That would be fine 

## 2019-06-12 NOTE — Telephone Encounter (Signed)
Asking for doctors note today due to having chest pain at work  Stated she need it to be able to leave work today and asked that it be good for tomorrow also

## 2019-06-14 ENCOUNTER — Other Ambulatory Visit: Payer: Self-pay | Admitting: Physician Assistant

## 2019-06-14 ENCOUNTER — Other Ambulatory Visit: Payer: Self-pay

## 2019-06-14 ENCOUNTER — Ambulatory Visit (INDEPENDENT_AMBULATORY_CARE_PROVIDER_SITE_OTHER): Payer: 59 | Admitting: Physician Assistant

## 2019-06-14 DIAGNOSIS — R0600 Dyspnea, unspecified: Secondary | ICD-10-CM | POA: Diagnosis not present

## 2019-06-14 DIAGNOSIS — R000 Tachycardia, unspecified: Secondary | ICD-10-CM

## 2019-06-14 DIAGNOSIS — R079 Chest pain, unspecified: Secondary | ICD-10-CM | POA: Diagnosis not present

## 2019-06-14 MED ORDER — METOPROLOL SUCCINATE ER 25 MG PO TB24: 25.0000 mg | ORAL_TABLET | Freq: Every day | ORAL | 3 refills | Status: DC

## 2019-06-14 MED ORDER — ALBUTEROL SULFATE HFA 108 (90 BASE) MCG/ACT IN AERS: 2.0000 | INHALATION_SPRAY | Freq: Four times a day (QID) | RESPIRATORY_TRACT | 1 refills | Status: DC | PRN

## 2019-06-14 NOTE — Progress Notes (Signed)
Telephone visit  Subjective: ZO:XWRUE pain PCP: Terald Sleeper, PA-C [id number] Paula Elliott is a 28 y.o. female calls for telephone consult today. Patient provides verbal consent for consult held via phone.  Patient is identified with 2 separate identifiers.  At this time the entire area is on COVID-19 social distancing and stay home orders are in place.  Patient is of higher risk and therefore we are performing this by a virtual method.  Location of patient: home Location of provider: HOME Others present for call: no  This patient is having a telephone visit in regards to continued chest pain.  She is currently under cardiology care.  And is supposed to have an MRI performed.  She had also been referred to [facility] for congenital cardiology evaluation.  She states that the echocardiogram was normal.  Her MRI is not until the 27th of the month.  She has continued with chest pain.  She states that her symptoms started as far back as April.  She has been taking her medications as she is supposed to.  She has no other changes in her health.  She is having consistently elevated pulses.  She states that they are always 90s to 100s.  We are going to look at trying a little bit of a blood beta-blocker to see if this can help some of her symptoms and lower heart rate.   ROS: Per HPI  No Known Allergies Past Medical History:  Diagnosis Date  . Anxiety   . Depression   . Heart murmur     Current Outpatient Medications:  .  albuterol (VENTOLIN HFA) 108 (90 Base) MCG/ACT inhaler, Inhale 2 puffs into the lungs every 6 (six) hours as needed for wheezing or shortness of breath., Disp: 18 g, Rfl: 1 .  Armodafinil 250 MG tablet, Take 250 mg by mouth every morning., Disp: , Rfl:  .  clonazePAM (KLONOPIN) 0.5 MG tablet, Take 1 tablet (0.5 mg total) by mouth 4 (four) times daily as needed for anxiety., Disp: 120 tablet, Rfl: 2 .  cloNIDine (CATAPRES) 0.1 MG tablet, 1 po qhs for 4 nights  then 2 qhs, Disp: 60 tablet, Rfl: 2 .  DULoxetine (CYMBALTA) 60 MG capsule, Take 2 capsules (120 mg total) by mouth daily., Disp: 60 capsule, Rfl: 2 .  gabapentin (NEURONTIN) 300 MG capsule, Take 1 capsule (300 mg total) by mouth 3 (three) times daily., Disp: 90 capsule, Rfl: 2 .  metFORMIN (GLUCOPHAGE) 1000 MG tablet, Take 1 tablet (1,000 mg total) by mouth daily. with food, Disp: 30 tablet, Rfl: 2 .  metoprolol succinate (TOPROL-XL) 25 MG 24 hr tablet, Take 1 tablet (25 mg total) by mouth daily., Disp: 90 tablet, Rfl: 3 .  minocycline (MINOCIN,DYNACIN) 50 MG capsule, Take by mouth daily., Disp: , Rfl: 3 .  mirtazapine (REMERON) 15 MG tablet, Take 7.5-15 mg by mouth at bedtime., Disp: , Rfl:  .  QUEtiapine (SEROQUEL) 400 MG tablet, Take 2 tablets (800 mg total) by mouth at bedtime., Disp: 60 tablet, Rfl: 2 .  zolpidem (AMBIEN) 10 MG tablet, Take 1 tablet (10 mg total) by mouth at bedtime as needed for up to 30 days for sleep., Disp: 30 tablet, Rfl: 0 .  vitamin B-12 (CYANOCOBALAMIN) 500 MCG tablet, Take 1 tablet daily, Disp: 30 tablet, Rfl: 2  Assessment/ Plan: 28 y.o. female   1. Dyspnea, unspecified type - albuterol (VENTOLIN HFA) 108 (90 Base) MCG/ACT inhaler; Inhale 2 puffs into the lungs every 6 (  six) hours as needed for wheezing or shortness of breath.  Dispense: 18 g; Refill: 1  2. Chest pain, unspecified type - metoprolol succinate (TOPROL-XL) 25 MG 24 hr tablet; Take 1 tablet (25 mg total) by mouth daily.  Dispense: 90 tablet; Refill: 3  3. Tachycardia - metoprolol succinate (TOPROL-XL) 25 MG 24 hr tablet; Take 1 tablet (25 mg total) by mouth daily.  Dispense: 90 tablet; Refill: 3   No follow-ups on file.  Continue all other maintenance medications as listed above.  Start time: 12:29 PM End time: 12:52 PM  Meds ordered this encounter  Medications  . albuterol (VENTOLIN HFA) 108 (90 Base) MCG/ACT inhaler    Sig: Inhale 2 puffs into the lungs every 6 (six) hours as needed  for wheezing or shortness of breath.    Dispense:  18 g    Refill:  1    Order Specific Question:   Supervising Provider    Answer:   Janora Norlander [[id number]]  . metoprolol succinate (TOPROL-XL) 25 MG 24 hr tablet    Sig: Take 1 tablet (25 mg total) by mouth daily.    Dispense:  90 tablet    Refill:  3    Order Specific Question:   Supervising Provider    Answer:   Janora Norlander [[id number]]    Paula Nearing PA-C [place] [phone]

## 2019-06-18 ENCOUNTER — Telehealth: Payer: Self-pay | Admitting: Cardiovascular Disease

## 2019-06-18 ENCOUNTER — Encounter: Payer: Self-pay | Admitting: Physician Assistant

## 2019-06-18 ENCOUNTER — Encounter: Payer: Self-pay | Admitting: *Deleted

## 2019-06-18 NOTE — Telephone Encounter (Signed)
Discussed test results of Echo with patient again.  Informed her that Dr. Bronson Ing is out of office this week.  Her MRI scheduled for 06/25/2019 which is first available.    Call placed to Dr. Corine Shelter - new patient appointment scheduled for 07/03/2019 at 1:40 in [facility] located at [address] [place], [place].  Fax:  [phone].   First available in [place] clinic is not until December.    Patient wanting more explanation about her Echo & why he is doing the MRI.  Also, continued c/o of chest pain.  States the chest pain is causing some anxiety and panic attacks.  Stated she did go to [facility] ED recently for the chest pain.  Notes requested today.

## 2019-06-18 NOTE — Telephone Encounter (Signed)
Patient called stating that she would like to speak with Dr. Bronson Ing in regards to her recent test results

## 2019-06-18 NOTE — Telephone Encounter (Signed)
The echo showed a possible defect in the heart called a ventricular septal defect (a small hole connecting the chambers of the heart called ventricles), however due to the limitations of the clarity of the pictures it was difficult to confirm this . An MRI will give much clearer pictures of this area and confirm if this abnormality is present and will help the congenital heart specialist guide her management.    Carlyle Dolly MD

## 2019-06-18 NOTE — Telephone Encounter (Signed)
Left message to return call 

## 2019-06-19 ENCOUNTER — Other Ambulatory Visit: Payer: Self-pay | Admitting: Physician Assistant

## 2019-06-19 ENCOUNTER — Telehealth: Payer: Self-pay | Admitting: Physician Assistant

## 2019-06-19 DIAGNOSIS — Z20822 Contact with and (suspected) exposure to covid-19: Secondary | ICD-10-CM

## 2019-06-19 DIAGNOSIS — Z20828 Contact with and (suspected) exposure to other viral communicable diseases: Secondary | ICD-10-CM

## 2019-06-19 NOTE — Telephone Encounter (Signed)
Patient notified and verbalized understanding.  She was appreciative of the call back & explanation.

## 2019-06-19 NOTE — Telephone Encounter (Signed)
Pt aware of order and site for testing  Note written with recommendations from Tamora - to giver her Fredrich Birks.

## 2019-06-19 NOTE — Telephone Encounter (Signed)
Any person can go get tested at the test site center in [place].  It is at [address] [place].  You do not even have to have an order.  I can put the order in there and it will show up.  However they cut off at 3:30 PM each day they start back at 8 AM.  And until then she should isolate herself from others.  Once the test returns she will either continue to isolate if positive for 14 days total or can return to normal activity if negative.

## 2019-06-20 ENCOUNTER — Other Ambulatory Visit: Payer: 59

## 2019-06-20 ENCOUNTER — Ambulatory Visit (INDEPENDENT_AMBULATORY_CARE_PROVIDER_SITE_OTHER): Payer: 59 | Admitting: Mental Health

## 2019-06-20 DIAGNOSIS — F319 Bipolar disorder, unspecified: Secondary | ICD-10-CM | POA: Diagnosis not present

## 2019-06-20 NOTE — Progress Notes (Signed)
[facility] Counselor/Therapist Progress Note   Patient ID: Paula Elliott,  MRN: [id number] Date:  06/06/19 Timespent: 60 minutes    Virtual Visit via Telephone Note Connected with patient by a video enabled telemedicine/telehealth application or telephone, with their informed consent, and verified patient privacy and that I am speaking with the correct person using two identifiers. I discussed the limitations, risks, security and privacy concerns of performing psychotherapy and management service by telephone and the availability of in person appointments. I also discussed with the patient that there may be a patient responsible charge related to this service. The patient expressed understanding and agreed to proceed. I discussed the treatment planning with the patient. The patient was provided an opportunity to ask questions and all were answered. The patient agreed with the plan and demonstrated an understanding of the instructions. The patient was advised to call  our office if  symptoms worsen or feel they are in a crisis state and need immediate contact.   Therapist Location: home Patient Location: home  Treatment Type: Individual  Mental Status Exam:    Appearance:   Casual, appropriate  Behavior:  WNL  Motor:  WNL  Speech/Language:   Clear and Coherent  Affect:  WNL  Mood:  Anxious, depressed  Thought process:  normal  Thought content:   WNL, denies SI/HI  Sensory/Perceptual disturbances:   none  Orientation:  oriented to person, place and time/date  Attention:  Good  Concentration:  Good  Memory:  WNL  Fund of knowledge:   Good  Insight:   developing  Judgment:   developing  Impulse Control:  developing   Reported Symptoms: Depressed mood most days, anxiety, rumination, hx of mood swings, crying spells  Risk Assessment: Danger to Self: No Self-injurious Behavior: Yes (superficial) Danger to Others: No Duty to  Warn:no Physical Aggression / Violence:No  Access to Firearms a concern: No  Gang Involvement:No  Patient / guardian was educated about steps to take if suicide or homicide risk level increases between visits. While future psychiatric events cannot be accurately predicted, the patient does not currently require acute inpatient psychiatric care and does not currently meet [facility] involuntary commitment criteria.  Subjective:   Patient engaged in session via telehealth.  She is to have a MRI on Monday due to her cardiac issues and chronic chest pains. She shared how she had a recent discussion w/ her mother about her lack of support, where she tends to focus on herself. She told her mother she needed support emotionally w/o her mother making it "about her". She stated her mother got upset and did not validate her feelings/comments. This is typical per Pt. She stated she has been feeling manic recently, spending money more recently. Stated her mood shifts from manic to depressed daily. She tells herself "buy it all" referring to purchasing items. Then sleep excessively. Has been able to maintain going to work, but has been leaving early from work due to feeling overwhelmed, stressed, really upset (but cant cry), or angry/sad. She has been more irritable lately, hanging up on customers at work. Gave her homework related to identifying depressive/manic bx's /warning signs. Continue to provide support, understanding working with patient from a cognitive behavioral framework.    Interventions: Cognitive Behavioral Therapy , supportive therapy, strength based approaches  Diagnosis:   ICD-10-CM   1. Bipolar I disorder ([place])  F31.9     Plan:  1.  Patient to continue to engage in individual counseling  2-4 times a month or as needed. 2.  Patient to identify and apply CBT, coping skills learned in session to decrease depression and anxiety symptoms. 3.  Patient to contact this office, go to the local  ED or call 911 if a crisis or emergency develops between visits.   Anson [place], [facility]

## 2019-06-21 ENCOUNTER — Telehealth: Payer: Self-pay | Admitting: *Deleted

## 2019-06-21 ENCOUNTER — Other Ambulatory Visit: Payer: Self-pay | Admitting: Physician Assistant

## 2019-06-21 NOTE — Telephone Encounter (Signed)
Left message regarding appointment schedule Monday 06/25/19 at 8:00 am at [facility] for cardiac MRI---arrival time is 7:15 am at the 1st floor admissions office.

## 2019-06-22 ENCOUNTER — Ambulatory Visit: Payer: 59 | Admitting: Physician Assistant

## 2019-06-22 ENCOUNTER — Telehealth (HOSPITAL_COMMUNITY): Payer: Self-pay | Admitting: Emergency Medicine

## 2019-06-22 LAB — NOVEL CORONAVIRUS, NAA: SARS-CoV-2, NAA: NOT DETECTED

## 2019-06-22 NOTE — Telephone Encounter (Signed)
Reaching out to patient to offer assistance regarding upcoming cardiac imaging study; pt verbalizes understanding of appt date/time, parking situation and where to check in, and verified current allergies; name and call back number provided for further questions should they arise Shiniqua Groseclose RN Navigator Cardiac Imaging [facility] [phone] office [phone] cell 

## 2019-06-22 NOTE — Telephone Encounter (Signed)
Last fill 07/03 okay to call in

## 2019-06-25 ENCOUNTER — Other Ambulatory Visit: Payer: Self-pay

## 2019-06-25 ENCOUNTER — Ambulatory Visit (HOSPITAL_COMMUNITY)
Admission: RE | Admit: 2019-06-25 | Discharge: 2019-06-25 | Disposition: A | Discharge status: Home / Self Care | Payer: 59 | Source: Ambulatory Visit | Attending: Cardiovascular Disease | Admitting: Cardiovascular Disease

## 2019-06-25 DIAGNOSIS — Q249 Congenital malformation of heart, unspecified: Secondary | ICD-10-CM

## 2019-06-25 MED ORDER — GADOBUTROL 1 MMOL/ML IV SOLN
10.0000 mL | Freq: Once | INTRAVENOUS | Status: AC | PRN
  Administered 2019-06-25: 10 mL via INTRAVENOUS

## 2019-06-25 NOTE — Telephone Encounter (Signed)
Has appt 08/31

## 2019-06-27 ENCOUNTER — Ambulatory Visit: Payer: 59 | Admitting: Mental Health

## 2019-06-27 ENCOUNTER — Ambulatory Visit (INDEPENDENT_AMBULATORY_CARE_PROVIDER_SITE_OTHER): Payer: 59 | Admitting: Mental Health

## 2019-06-27 ENCOUNTER — Other Ambulatory Visit: Payer: Self-pay

## 2019-06-27 ENCOUNTER — Telehealth: Payer: Self-pay | Admitting: Cardiovascular Disease

## 2019-06-27 DIAGNOSIS — F319 Bipolar disorder, unspecified: Secondary | ICD-10-CM | POA: Diagnosis not present

## 2019-06-27 MED ORDER — POTASSIUM CHLORIDE ER 10 MEQ PO TBCR: 10.0000 meq | EXTENDED_RELEASE_TABLET | Freq: Every day | ORAL | 1 refills | Status: DC

## 2019-06-27 MED ORDER — FUROSEMIDE 20 MG PO TABS: 20.0000 mg | ORAL_TABLET | Freq: Every day | ORAL | 1 refills | Status: DC

## 2019-06-27 NOTE — Progress Notes (Signed)
      [facility] Counselor/Therapist Progress Note   Patient ID: Paula Elliott,  MRN: [id number] Date:  06/06/19 Timespent: 42 minutes    Virtual Visit via Telephone Note Connected with patient by a video enabled telemedicine/telehealth application or telephone, with their informed consent, and verified patient privacy and that I am speaking with the correct person using two identifiers. I discussed the limitations, risks, security and privacy concerns of performing psychotherapy and management service by telephone and the availability of in person appointments. I also discussed with the patient that there may be a patient responsible charge related to this service. The patient expressed understanding and agreed to proceed. I discussed the treatment planning with the patient. The patient was provided an opportunity to ask questions and all were answered. The patient agreed with the plan and demonstrated an understanding of the instructions. The patient was advised to call  our office if  symptoms worsen or feel they are in a crisis state and need immediate contact.   Therapist Location: home Patient Location: home  Treatment Type: Individual  Mental Status Exam:    Appearance:   Casual, appropriate  Behavior:  WNL  Motor:  WNL  Speech/Language:   Clear and Coherent  Affect:  WNL  Mood:  Anxious, depressed  Thought process:  normal  Thought content:   WNL, denies SI/HI  Sensory/Perceptual disturbances:   none  Orientation:  oriented to person, place and time/date  Attention:  Good  Concentration:  Good  Memory:  WNL  Fund of knowledge:   Good  Insight:   developing  Judgment:   developing  Impulse Control:  developing   Reported Symptoms: Depressed mood most days, anxiety, rumination, hx of mood swings, crying spells    Risk Assessment: Danger to Self: No Self-injurious Behavior: Yes (superficial) Danger to Others: No Duty to  Warn:no Physical Aggression / Violence:No  Access to Firearms a concern: No  Gang Involvement:No  Patient / guardian was educated about steps to take if suicide or homicide risk level increases between visits. While future psychiatric events cannot be accurately predicted, the patient does not currently require acute inpatient psychiatric care and does not currently meet [facility] involuntary commitment criteria.  Subjective:   Patient engaged in session via telehealth. She stated she had the MRI on Monday due to her cardiac issues and chronic chest pains.  She shared how she had a recent work stress. She returned to work about 2 days ago following a home quarantine due to being exposed to someone w/ COVID 19 about 2-3 weeks ago. She is going to the beach w/ her mother and friend. She is going tomorrow, looking forward to getting away. Denies any manic spending recently. Continue to provide support, understanding working with patient from a cognitive behavioral framework.     Interventions: Cognitive Behavioral Therapy , supportive therapy, strength based approaches  Diagnosis:   ICD-10-CM   1. Bipolar I disorder ([place])  F31.9     Plan:  1.  Patient to continue to engage in individual counseling 2-4 times a month or as needed. 2.  Patient to identify and apply CBT, coping skills learned in session to decrease depression and anxiety symptoms. 3.  Patient to contact this office, go to the local ED or call 911 if a crisis or emergency develops between visits.   Anson [place], [facility]

## 2019-06-27 NOTE — Telephone Encounter (Signed)
If is due to fluid overload (swelling in legs), try Lasix 20 mg daily x 3 days. Give KCl 10 meq x 3 days as well. One refill.

## 2019-06-27 NOTE — Telephone Encounter (Signed)
Patient informed and verbalized understanding. Appointment with congenital specialist is next week on 07/03/2019.  Patient is asking if there is any medication she can take for the chest pain.

## 2019-06-27 NOTE — Telephone Encounter (Signed)
Patient called stating that she is having some chest pains this am with shortness of breath.  States that while at work she is having heaviness in her chest. States that she feels like she is retaining fluid in her legs and ankles at the end of the day.

## 2019-06-27 NOTE — Telephone Encounter (Signed)
Patient informed and verbalized understanding of plan. 

## 2019-06-27 NOTE — Telephone Encounter (Signed)
When is her appointment with the adult congenital specialist?  She should be following up with them.  In the interim, she could have an appointment with an APP at any office.

## 2019-06-28 ENCOUNTER — Other Ambulatory Visit: Payer: Self-pay | Admitting: Physician Assistant

## 2019-06-29 NOTE — Telephone Encounter (Signed)
Next appt 08/31

## 2019-07-03 ENCOUNTER — Telehealth: Payer: Self-pay | Admitting: *Deleted

## 2019-07-03 NOTE — Telephone Encounter (Signed)
Notes recorded by Laurine Blazer, LPN on 12/31/1171 at 5:67 AM EDT  Note fwd to Dr. Corine Shelter & pmd. Patient has follow up scheduled 08/07/2019.  ------   Notes recorded by Herminio Commons, MD on 06/25/2019 at 1:42 PM EDT  Please forward a copy to patient's adult congenital specialist.

## 2019-07-04 ENCOUNTER — Ambulatory Visit (INDEPENDENT_AMBULATORY_CARE_PROVIDER_SITE_OTHER): Payer: 59 | Admitting: Mental Health

## 2019-07-04 ENCOUNTER — Other Ambulatory Visit: Payer: Self-pay

## 2019-07-04 DIAGNOSIS — F319 Bipolar disorder, unspecified: Secondary | ICD-10-CM

## 2019-07-04 NOTE — Progress Notes (Signed)
[facility] Counselor/Therapist Progress Note   Patient ID: Paula Elliott,  MRN: [id number] Date:  07/04/19 Timespent: 75 minutes    Virtual Visit via Telephone Note Connected with patient by a video enabled telemedicine/telehealth application or telephone, with their informed consent, and verified patient privacy and that I am speaking with the correct person using two identifiers. I discussed the limitations, risks, security and privacy concerns of performing psychotherapy and management service by telephone and the availability of in person appointments. I also discussed with the patient that there may be a patient responsible charge related to this service. The patient expressed understanding and agreed to proceed. I discussed the treatment planning with the patient. The patient was provided an opportunity to ask questions and all were answered. The patient agreed with the plan and demonstrated an understanding of the instructions. The patient was advised to call  our office if  symptoms worsen or feel they are in a crisis state and need immediate contact.   Therapist Location: home Patient Location: home  Treatment Type: Individual  Mental Status Exam:    Appearance:   Casual, appropriate  Behavior:  WNL  Motor:  WNL  Speech/Language:   Clear and Coherent  Affect:  WNL  Mood:  Anxious, depressed  Thought process:  normal  Thought content:   WNL, denies SI/HI  Sensory/Perceptual disturbances:   none  Orientation:  oriented to person, place and time/date  Attention:  Good  Concentration:  Good  Memory:  WNL  Fund of knowledge:   Good  Insight:   developing  Judgment:   developing  Impulse Control:  developing   Reported Symptoms: Depressed mood most days, anxiety, rumination, hx of mood swings, crying spells    Risk Assessment: Danger to Self: No Self-injurious Behavior: Yes (superficial) Danger to Others: No Duty to  Warn:no Physical Aggression / Violence:No  Access to Firearms a concern: No  Gang Involvement:No  Patient / guardian was educated about steps to take if suicide or homicide risk level increases between visits. While future psychiatric events cannot be accurately predicted, the patient does not currently require acute inpatient psychiatric care and does not currently meet [facility] involuntary commitment criteria.  Subjective:   Patient engaged in session via telehealth.  Patient shared progress related to medical issues.  She states she is states is to have a heart catheter to explore for any blockages or other concerns in the next 2 weeks.  She processed feelings of frustration as she feels her father does not take her medical conditions as serious.  She acknowledges this is in part the way she feels he may cope with stress specifically worrying about her and his inability to express himself openly.  Continues to have work-related stress where she may have to leave work early at times due to increased anxiety and panic.  Assisted her in identifying negative self talk that contributes to anxiety and affects her ability to maintain functioning.  Provided support, understanding as she processed feelings related to concerns about her health.  Encouraged her to continue journaling between sessions discussing some potential focus areas.  Continue to provide support, understanding working with patient from a cognitive behavioral framework.     Interventions: Cognitive Behavioral Therapy , supportive therapy, strength based approaches  Diagnosis:   ICD-10-CM   1. Bipolar I disorder ([place])  F31.9     Plan:  1.  Patient to continue to engage in individual counseling 2-4 times a month or  as needed. 2.  Patient to identify and apply CBT, coping skills learned in session to decrease depression and anxiety symptoms. 3.  Patient to contact this office, go to the local ED or call 911 if a crisis or  emergency develops between visits.   Anson [place], [facility]

## 2019-07-11 ENCOUNTER — Ambulatory Visit: Payer: 59 | Admitting: Mental Health

## 2019-07-18 ENCOUNTER — Other Ambulatory Visit: Payer: Self-pay

## 2019-07-18 ENCOUNTER — Ambulatory Visit (INDEPENDENT_AMBULATORY_CARE_PROVIDER_SITE_OTHER): Payer: 59 | Admitting: Mental Health

## 2019-07-18 DIAGNOSIS — F319 Bipolar disorder, unspecified: Secondary | ICD-10-CM

## 2019-07-18 NOTE — Progress Notes (Signed)
      [facility] Counselor/Therapist Progress Note   Patient ID: Paula Elliott,  MRN: [id number] Date:  07/18/19  Timespent: 55 minutes    Treatment Type: Individual  Mental Status Exam:    Appearance:   Casual, appropriate  Behavior:  WNL  Motor:  WNL  Speech/Language:   Clear and Coherent  Affect:  Full range  Mood:  Anxious, depressed  Thought process:  normal  Thought content:   WNL, denies SI/HI  Sensory/Perceptual disturbances:   none  Orientation:  oriented to person, place and time/date  Attention:  Good  Concentration:  Good  Memory:  WNL  Fund of knowledge:   Good  Insight:   developing  Judgment:   developing  Impulse Control:  developing   Reported Symptoms: Depressed mood most days, anxiety, rumination, hx of mood swings, crying spells    Risk Assessment: Danger to Self: No Self-injurious Behavior: Yes (superficial) Danger to Others: No Duty to Warn:no Physical Aggression / Violence:No  Access to Firearms a concern: No  Gang Involvement:No  Patient / guardian was educated about steps to take if suicide or homicide risk level increases between visits. While future psychiatric events cannot be accurately predicted, the patient does not currently require acute inpatient psychiatric care and does not currently meet [facility] involuntary commitment criteria.  Subjective:   Patient engaged in session at office.  She shared progress.  She stated that she had some pleasant news as she learned from some recent testing that her heart did not have any blockage.  She stated she continues to cope with chest pressure and pain and plans to follow through with doctor appointments to further investigate.  She continues to cope with work stress, continues to feel uncertain about her future at her current company.  She continues to seek other employment sharing efforts.  She plans to follow through even more in her job search process.   Patient is making progress recently and decreasing self-injurious behaviors and levels of depression.  Reminded patient of her more long-term goals to gain independence and how she is taking steps toward this by looking for other employment and engaging in self-care with her doctor appointments. Continued to provide support, understanding working with patient from a cognitive behavioral framework.  Patient was encouraged to write down instances where she can remind herself of her personal strengths and progress.   Interventions: Cognitive Behavioral Therapy , supportive therapy, strength based approaches  Diagnosis: No diagnosis found.  Plan:  1.  Patient to continue to engage in individual counseling 2-4 times a month or as needed. 2.  Patient to identify and apply CBT, coping skills learned in session to decrease depression and anxiety symptoms. 3.  Patient to contact this office, go to the local ED or call 911 if a crisis or emergency develops between visits.   Anson [place], [facility]

## 2019-07-20 ENCOUNTER — Encounter: Payer: Self-pay | Admitting: Physician Assistant

## 2019-07-20 ENCOUNTER — Other Ambulatory Visit: Payer: Self-pay

## 2019-07-20 ENCOUNTER — Ambulatory Visit (INDEPENDENT_AMBULATORY_CARE_PROVIDER_SITE_OTHER): Payer: 59 | Admitting: Physician Assistant

## 2019-07-20 DIAGNOSIS — F900 Attention-deficit hyperactivity disorder, predominantly inattentive type: Secondary | ICD-10-CM

## 2019-07-20 DIAGNOSIS — F411 Generalized anxiety disorder: Secondary | ICD-10-CM

## 2019-07-20 DIAGNOSIS — G4700 Insomnia, unspecified: Secondary | ICD-10-CM

## 2019-07-20 DIAGNOSIS — G2581 Restless legs syndrome: Secondary | ICD-10-CM

## 2019-07-20 DIAGNOSIS — F319 Bipolar disorder, unspecified: Secondary | ICD-10-CM

## 2019-07-20 MED ORDER — ZOLPIDEM TARTRATE 10 MG PO TABS: ORAL_TABLET | ORAL | 2 refills | Status: DC

## 2019-07-20 MED ORDER — CLONIDINE HCL 0.1 MG PO TABS: ORAL_TABLET | ORAL | 2 refills | Status: DC

## 2019-07-20 MED ORDER — ALPRAZOLAM 1 MG PO TABS: 0.5000 mg | ORAL_TABLET | Freq: Three times a day (TID) | ORAL | 0 refills | Status: DC | PRN

## 2019-07-20 NOTE — Progress Notes (Signed)
[facility] Med Check  Patient ID: Paula Elliott,  MRN: [id number]  PCP: Terald Sleeper, PA-C  Date of Evaluation: 07/20/2019 Time spent:15 minutes  Chief Complaint:  Chief Complaint    Anxiety; Depression; Insomnia; Follow-up     Virtual Visit via Telephone Note  I connected with patient by a video enabled telemedicine application or telephone, with their informed consent, and verified patient privacy and that I am speaking with the correct person using two identifiers.  I am private, in my home and the patient is in her car.  I discussed the limitations, risks, security and privacy concerns of performing an evaluation and management service by telephone and the availability of in person appointments. I also discussed with the patient that there may be a patient responsible charge related to this service. The patient expressed understanding and agreed to proceed.   I discussed the assessment and treatment plan with the patient. The patient was provided an opportunity to ask questions and all were answered. The patient agreed with the plan and demonstrated an understanding of the instructions.   The patient was advised to call back or seek an in-person evaluation if the symptoms worsen or if the condition fails to improve as anticipated.  I provided 15 minutes of non-face-to-face time during this encounter.  HISTORY/CURRENT STATUS: HPI For routine med check.   Adding the Clonidine has been helpful.  Feels like it could be even better though.  She's been more able to focus but anxiety is still a problem.  Feels that Klonopin doesn't last even though it's supposed to be long acting.  She's even taken 3 at a time one morning and it didn't do anything.  Would like to change if possible.   Feels that depression is better.  Work is still not easy, but thinks meds are helping her get through those challenges.  And of course counseling is helping a lot too. Because of her heart  history and recent chest pain, she's had several visits w/ her cardiologist, and a cath done.  Results nl w/ it. But that's been making her feel a little down, just not knowing what's wrong.  Energy and motivation are good.  Not isolating. Does cry sometimes.  Says that her 'downs are not as bad as they were.'  No SI/HI.  Patient denies increased energy with decreased need for sleep, no increased talkativeness, no racing thoughts, no impulsivity or risky behaviors, has had a little increased spending, no increased libido, no grandiosity. There've been a few times where she feels more energy, but it's not severe mania like in the past.   Sleeps much better with the Ambien.  "It feels so good to get some rest!"  Denies dizziness, syncope, seizures, numbness, tingling, tremor, tics, unsteady gait, slurred speech, confusion. Denies muscle or joint pain, stiffness, or dystonia.  Individual Medical History/ Review of Systems: Changes? :Yes heart cath this week, was normal.  Stress test result is pending.   Past medications for mental health diagnoses include: Wellbutrin, Xanax, Ativan, Provigil, Latuda, Lamictal, Abilify, Prozac, Luvox, Valium  Allergies: Patient has no known allergies.  Current Medications:  Current Outpatient Medications:  .  cloNIDine (CATAPRES) 0.1 MG tablet, 1 po  q am, then 2 qhs., Disp: 90 tablet, Rfl: 2 .  DULoxetine (CYMBALTA) 60 MG capsule, Take 2 capsules (120 mg total) by mouth daily., Disp: 60 capsule, Rfl: 2 .  gabapentin (NEURONTIN) 300 MG capsule, Take 1 capsule (300 mg total) by mouth 3 (three)  times daily., Disp: 90 capsule, Rfl: 2 .  metFORMIN (GLUCOPHAGE) 1000 MG tablet, TAKE 1 TABLET BY MOUTH EVERY DAY WITH FOOD, Disp: 30 tablet, Rfl: 2 .  metoprolol succinate (TOPROL-XL) 25 MG 24 hr tablet, Take 1 tablet (25 mg total) by mouth daily., Disp: 90 tablet, Rfl: 3 .  QUEtiapine (SEROQUEL) 400 MG tablet, Take 2 tablets (800 mg total) by mouth at bedtime., Disp: 60  tablet, Rfl: 2 .  vitamin B-12 (CYANOCOBALAMIN) 500 MCG tablet, Take 1 tablet daily, Disp: 30 tablet, Rfl: 2 .  zolpidem (AMBIEN) 10 MG tablet, 1 qhs prn sleep, Disp: 30 tablet, Rfl: 2 .  albuterol (VENTOLIN HFA) 108 (90 Base) MCG/ACT inhaler, Inhale 2 puffs into the lungs every 6 (six) hours as needed for wheezing or shortness of breath. (Patient not taking: Reported on 07/20/2019), Disp: 18 g, Rfl: 1 .  ALPRAZolam (XANAX) 1 MG tablet, Take 0.5-1 tablets (0.5-1 mg total) by mouth 3 (three) times daily as needed for anxiety., Disp: 90 tablet, Rfl: 0 .  furosemide (LASIX) 20 MG tablet, Take 1 tablet (20 mg total) by mouth daily for 3 days., Disp: 3 tablet, Rfl: 1 .  minocycline (MINOCIN,DYNACIN) 50 MG capsule, Take by mouth daily., Disp: , Rfl: 3 .  potassium chloride (K-DUR) 10 MEQ tablet, Take 1 tablet (10 mEq total) by mouth daily for 3 days., Disp: 3 tablet, Rfl: 1 Medication Side Effects: none  Family Medical/ Social History: Changes? No  MENTAL HEALTH EXAM:  There were no vitals taken for this visit.There is no height or weight on file to calculate BMI.  General Appearance: unable to assess  Eye Contact:  unable to assess  Speech:  Clear and Coherent  Volume:  Normal  Mood:  Euthymic  Affect:  unable to assess  Thought Process:  Goal Directed  Orientation:  Full (Time, Place, and Person)  Thought Content: Logical   Suicidal Thoughts:  No  Homicidal Thoughts:  No  Memory:  WNL  Judgement:  Good  Insight:  Good  Psychomotor Activity:  unable to assess  Concentration:  Concentration: Good and Attention Span: Good  Recall:  Good  Fund of Knowledge: Good  Language: Good  Assets:  Desire for Improvement  ADL's:  Intact  Cognition: WNL  Prognosis:  Good    DIAGNOSES:    ICD-10-CM   1. Generalized anxiety disorder  F41.1   2. Bipolar I disorder ([place])  F31.9   3. Attention deficit hyperactivity disorder (ADHD), predominantly inattentive type  F90.0   4. Insomnia, unspecified  type  G47.00   5. Restless leg syndrome  G25.81     Receiving Psychotherapy: Yes with [name], [facility]   RECOMMENDATIONS:  Increase Clonidine to 0.1 mg q am, and 0.2mg  qhs.  Watch for dizziness, hold the 3rd pill, and call if it occurs.  She understands. D/C Klonopin. Start Xanax 1 mg, 1/2-1 po tid prn.  Take as little as possible. PDMP reviewed. Cont Cymbalta 60 mg, 2 qd Cont Gabapentin 300 mg tid Cont Seroquel 400 mg, 2 qhs Cont Ambien 10 mg qhs prn. Cont therapy w/ [name], [facility] Return in 4-6 weeks.  Donnal Moat, PA-C   This record has been created using [employer].  Chart creation errors have been sought, but may not always have been located and corrected. Such creation errors do not reflect on the standard of medical care.

## 2019-07-25 ENCOUNTER — Other Ambulatory Visit: Payer: Self-pay

## 2019-07-25 ENCOUNTER — Ambulatory Visit (INDEPENDENT_AMBULATORY_CARE_PROVIDER_SITE_OTHER): Payer: 59 | Admitting: Mental Health

## 2019-07-25 DIAGNOSIS — F3181 Bipolar II disorder: Secondary | ICD-10-CM

## 2019-07-25 NOTE — Progress Notes (Signed)
      [facility] Counselor/Therapist Progress Note   Patient ID: Paula Elliott,  MRN: [id number] Date:  07/25/19  Timespent: 55 minutes    Treatment Type: Individual  Mental Status Exam:    Appearance:   Casual, appropriate  Behavior:  WNL  Motor:  WNL  Speech/Language:   Clear and Coherent  Affect:  Full range  Mood:  Anxious, depressed  Thought process:  normal  Thought content:   WNL, denies SI/HI  Sensory/Perceptual disturbances:   none  Orientation:  oriented to person, place and time/date  Attention:  Good  Concentration:  Good  Memory:  WNL  Fund of knowledge:   Good  Insight:   developing  Judgment:   developing  Impulse Control:  developing   Reported Symptoms: Depressed mood most days, anxiety, rumination, hx of mood swings, crying spells    Risk Assessment: Danger to Self: No Self-injurious Behavior: Yes (superficial) Danger to Others: No Duty to Warn:no Physical Aggression / Violence:No  Access to Firearms a concern: No  Gang Involvement:No  Patient / guardian was educated about steps to take if suicide or homicide risk level increases between visits. While future psychiatric events cannot be accurately predicted, the patient does not currently require acute inpatient psychiatric care and does not currently meet [facility] involuntary commitment criteria.  Subjective:   Patient engaged in session at office.  She shared progress, recent events. She stated she was terminated from her job. She shared thoughts, feelings. She ultimately was ready to leave, but wanted to do so more on her terms due to incidents where she felt treated unfairly, poor communication etc on the company's part over a period of time. Assisted her in problem solving next steps. She has been looking for a job, it is difficult due to the current environment with the pandemic. She was also able to notice increased assertiveness lately, less depressed  mood and no self injurious bx's. Pt was reminded of identifying self talk that has been helpful to make progress in these areas as well as overarching core beliefs that may be changing to a more positive, independent person, in which she has strived toward. Continued to provide support, understanding working with patient from a cognitive behavioral framework.      Interventions: Cognitive Behavioral Therapy , supportive therapy, strength based approaches  Diagnosis:   ICD-10-CM   1. Bipolar II disorder ([place])  F31.81     Plan:  1.  Patient to continue to engage in individual counseling 2-4 times a month or as needed. 2.  Patient to identify and apply CBT, coping skills learned in session to decrease depression and anxiety symptoms. 3.  Patient to contact this office, go to the local ED or call 911 if a crisis or emergency develops between visits.   [place]son [place], [facility]

## 2019-07-30 ENCOUNTER — Ambulatory Visit: Payer: 59 | Admitting: Physician Assistant

## 2019-07-31 ENCOUNTER — Telehealth: Payer: Self-pay | Admitting: Physician Assistant

## 2019-07-31 NOTE — Telephone Encounter (Signed)
Patient called and said that one of her medicines that she is on giving  Her vertigo and that  She has been lightheaded for 2- 3 days. Please call her at [phone]

## 2019-07-31 NOTE — Telephone Encounter (Signed)
Sounds good

## 2019-07-31 NOTE — Telephone Encounter (Signed)
Touched base with Paula Elliott again and she hasn't noticed anything different with taking the Xanax, advised if not better by tomorrow contact PCP to get their recommendations and she agreed.

## 2019-07-31 NOTE — Telephone Encounter (Signed)
Paula Elliott called back after speaking with her PCP. She wanted to let us know that they suggested Dramamine to help with her vertigo.

## 2019-07-31 NOTE — Telephone Encounter (Signed)
She did the right thing by decreasing the Clonidine.  I would go down to just 1 per day and see if that helps.  Except for changing from Klonopin to Xanax, that has been the only other change.  Is she getting dizzy after taking the Xanax?  I think she should see her PCP b/c it could be another problem, not just her psych meds.

## 2019-08-01 ENCOUNTER — Ambulatory Visit (INDEPENDENT_AMBULATORY_CARE_PROVIDER_SITE_OTHER): Payer: 59 | Admitting: Physician Assistant

## 2019-08-01 ENCOUNTER — Ambulatory Visit: Payer: 59 | Admitting: Mental Health

## 2019-08-01 DIAGNOSIS — R42 Dizziness and giddiness: Secondary | ICD-10-CM

## 2019-08-01 NOTE — Patient Instructions (Signed)

## 2019-08-02 ENCOUNTER — Other Ambulatory Visit: Payer: Self-pay

## 2019-08-02 ENCOUNTER — Ambulatory Visit (INDEPENDENT_AMBULATORY_CARE_PROVIDER_SITE_OTHER): Payer: 59 | Admitting: Mental Health

## 2019-08-02 DIAGNOSIS — F3181 Bipolar II disorder: Secondary | ICD-10-CM

## 2019-08-02 NOTE — Progress Notes (Signed)
      [facility] Counselor/Therapist Progress Note   Patient ID: Paula Elliott,  MRN: [id number] Date:  08/02/19  Timespent: 56 minutes    Treatment Type: Individual  Mental Status Exam:    Appearance:   Casual, appropriate  Behavior:  WNL  Motor:  WNL  Speech/Language:   Clear and Coherent  Affect:  Full range  Mood:  Anxious, depressed  Thought process:  normal  Thought content:   WNL, denies SI/HI  Sensory/Perceptual disturbances:   none  Orientation:  oriented to person, place and time/date  Attention:  Good  Concentration:  Good  Memory:  WNL  Fund of knowledge:   Good  Insight:   developing  Judgment:   developing  Impulse Control:  developing   Reported Symptoms: Depressed mood most days, anxiety, rumination, hx of mood swings, crying spells    Risk Assessment: Danger to Self: No Self-injurious Behavior: Yes (superficial) Danger to Others: No Duty to Warn:no Physical Aggression / Violence:No  Access to Firearms a concern: No  Gang Involvement:No  Patient / guardian was educated about steps to take if suicide or homicide risk level increases between visits. While future psychiatric events cannot be accurately predicted, the patient does not currently require acute inpatient psychiatric care and does not currently meet [facility] involuntary commitment criteria.  Subjective:   Patient engaged in session in no distress.  Shared how she had a discussion with her parents recently about her losing her job which occurred about a week ago.  Shared how she was upset due to her mother being critical while patient identified needing support.  Patient shared how she knows her parents are supportive and ways but often emotionally she does not feel she receives support that is needed.  She shared feelings related to how this occurred with the recent passing of her grandmother and her aunt prior.  Patient was able to be assertive,  communicate to her mother clearly her feelings and how her mother responded in a more understanding manner.  Patient plans to follow through with continuing her consistency around daily scheduling as she feels this will help with her daily functioning as she continues to seek employment.  Continued to provide support, understanding working with patient from a cognitive behavioral framework.      Interventions: Cognitive Behavioral Therapy , supportive therapy, strength based approaches  Diagnosis:   ICD-10-CM   1. Bipolar II disorder ([place])  F31.81     Plan:  1.  Patient to continue to engage in individual counseling 2-4 times a month or as needed. 2.  Patient to identify and apply CBT, coping skills learned in session to decrease depression and anxiety symptoms. 3.  Patient to contact this office, go to the local ED or call 911 if a crisis or emergency develops between visits.   Anson [place], [facility]

## 2019-08-06 ENCOUNTER — Encounter: Payer: Self-pay | Admitting: Physician Assistant

## 2019-08-06 NOTE — Progress Notes (Signed)
Telephone visit  Subjective: [id number] PCP: Terald Sleeper, PA-C [phone] Paula Elliott is a 28 y.o. female calls for telephone consult today. Patient provides verbal consent for consult held via phone.  Patient is identified with 2 separate identifiers.  At this time the entire area is on COVID-19 social distancing and stay home orders are in place.  Patient is of higher risk and therefore we are performing this by a virtual method.  Location of patient: home Location of provider: [facility] Others present for call: no  Patient reports over the past 2 days she has had increased episodes of dizziness.  It happened more when she was standing.  She has had occasional episodes of vertigo in the past.  She was started on some new medication from psychiatry.  And I discussed that sometimes stopping another medication can cause you to have some dizziness.  She states that it has gotten better with taking of the Dramamine and her anxiolytic.  I encouraged her to follow this for the next few days and if things do not get better to let us know and also let her psychiatrist know.  But for now to avoid any climbing or running a machinery if she is having episodes again.  She denies any nausea or vomiting, no fever or chills.   ROS: Per HPI  No Known Allergies Past Medical History:  Diagnosis Date  . Anxiety   . Depression   . Heart murmur     Current Outpatient Medications:  .  albuterol (VENTOLIN HFA) 108 (90 Base) MCG/ACT inhaler, Inhale 2 puffs into the lungs every 6 (six) hours as needed for wheezing or shortness of breath. (Patient not taking: Reported on 07/20/2019), Disp: 18 g, Rfl: 1 .  ALPRAZolam (XANAX) 1 MG tablet, Take 0.5-1 tablets (0.5-1 mg total) by mouth 3 (three) times daily as needed for anxiety., Disp: 90 tablet, Rfl: 0 .  cloNIDine (CATAPRES) 0.1 MG tablet, 1 po  q am, then 2 qhs., Disp: 90 tablet, Rfl: 2 .  DULoxetine (CYMBALTA) 60 MG capsule, Take 2 capsules (120 mg  total) by mouth daily., Disp: 60 capsule, Rfl: 2 .  furosemide (LASIX) 20 MG tablet, Take 1 tablet (20 mg total) by mouth daily for 3 days., Disp: 3 tablet, Rfl: 1 .  gabapentin (NEURONTIN) 300 MG capsule, Take 1 capsule (300 mg total) by mouth 3 (three) times daily., Disp: 90 capsule, Rfl: 2 .  metFORMIN (GLUCOPHAGE) 1000 MG tablet, TAKE 1 TABLET BY MOUTH EVERY DAY WITH FOOD, Disp: 30 tablet, Rfl: 2 .  metoprolol succinate (TOPROL-XL) 25 MG 24 hr tablet, Take 1 tablet (25 mg total) by mouth daily., Disp: 90 tablet, Rfl: 3 .  minocycline (MINOCIN,DYNACIN) 50 MG capsule, Take by mouth daily., Disp: , Rfl: 3 .  potassium chloride (K-DUR) 10 MEQ tablet, Take 1 tablet (10 mEq total) by mouth daily for 3 days., Disp: 3 tablet, Rfl: 1 .  QUEtiapine (SEROQUEL) 400 MG tablet, Take 2 tablets (800 mg total) by mouth at bedtime., Disp: 60 tablet, Rfl: 2 .  vitamin B-12 (CYANOCOBALAMIN) 500 MCG tablet, Take 1 tablet daily, Disp: 30 tablet, Rfl: 2 .  zolpidem (AMBIEN) 10 MG tablet, 1 qhs prn sleep, Disp: 30 tablet, Rfl: 2  Assessment/ Plan: 28 y.o. female   1. Vertigo Dramamine Valium   Return if symptoms worsen or fail to improve.  Continue all other maintenance medications as listed above.  Start time: 2:36 PM End time: 2:48 PM  No  orders of the defined types were placed in this encounter.   Paula Nearing PA-C [place] [phone]

## 2019-08-07 ENCOUNTER — Ambulatory Visit: Payer: 59 | Admitting: Cardiovascular Disease

## 2019-08-08 ENCOUNTER — Other Ambulatory Visit: Payer: Self-pay

## 2019-08-08 ENCOUNTER — Ambulatory Visit (INDEPENDENT_AMBULATORY_CARE_PROVIDER_SITE_OTHER): Payer: Self-pay | Admitting: Mental Health

## 2019-08-08 ENCOUNTER — Ambulatory Visit: Payer: 59 | Admitting: Mental Health

## 2019-08-08 DIAGNOSIS — F3181 Bipolar II disorder: Secondary | ICD-10-CM

## 2019-08-08 DIAGNOSIS — F411 Generalized anxiety disorder: Secondary | ICD-10-CM

## 2019-08-08 NOTE — Progress Notes (Signed)
      [facility] Counselor/Therapist Progress Note   Patient ID: Paula Elliott,  MRN: [id number] Date:  08/08/19  Timespent: 50 minutes    Treatment Type: Individual  Mental Status Exam:    Appearance:   Casual, appropriate  Behavior:  WNL  Motor:  WNL  Speech/Language:   Clear and Coherent  Affect:  Full range  Mood:  Anxious, depressed  Thought process:  normal  Thought content:   WNL, denies SI/HI  Sensory/Perceptual disturbances:   none  Orientation:  oriented to person, place and time/date  Attention:  Good  Concentration:  Good  Memory:  WNL  Fund of knowledge:   Good  Insight:   developing  Judgment:   developing  Impulse Control:  developing   Reported Symptoms: Depressed mood most days, anxiety, rumination, hx of mood swings, crying spells    Risk Assessment: Danger to Self: No Self-injurious Behavior: Yes (superficial) Danger to Others: No Duty to Warn:no Physical Aggression / Violence:No  Access to Firearms a concern: No  Gang Involvement:No  Patient / guardian was educated about steps to take if suicide or homicide risk level increases between visits. While future psychiatric events cannot be accurately predicted, the patient does not currently require acute inpatient psychiatric care and does not currently meet [facility] involuntary commitment criteria.  Subjective:   Patient engaged in session in no distress.  She continues to search for employment stating she has put out several applications online over the past 2 weeks.  She continues to have stress in the relationship with her parents.  She stated specifically her father can be verbally critical and how she has tried to speak to her mother for some assistance and discussing with him how his comments have been hurtful and upsetting.  She shared how her anxiety has persisted with some feelings of panic.  She had difficulty identifying specific triggers but that is  difficult to relax.  Patient shared how she has not engaged in any enjoyable activities.  Encouraged her to follow through on her passion for writing, to set aside time daily.  Shared how she can be self-critical of her writing but will attempt.  Continued to provide support, understanding working with patient from a cognitive behavioral framework.      Interventions: Cognitive Behavioral Therapy , supportive therapy, strength based approaches  Diagnosis:   ICD-10-CM   1. Bipolar II disorder ([place])  F31.81   2. Generalized anxiety disorder  F41.1     Plan:  1.  Patient to continue to engage in individual counseling 2-4 times a month or as needed. 2.  Patient to identify and apply CBT, coping skills learned in session to decrease depression and anxiety symptoms. 3.  Patient to contact this office, go to the local ED or call 911 if a crisis or emergency develops between visits.   Anson [place], [place]

## 2019-08-15 ENCOUNTER — Ambulatory Visit: Payer: 59 | Admitting: Mental Health

## 2019-08-16 ENCOUNTER — Ambulatory Visit (INDEPENDENT_AMBULATORY_CARE_PROVIDER_SITE_OTHER): Payer: 59 | Admitting: Mental Health

## 2019-08-16 ENCOUNTER — Other Ambulatory Visit: Payer: Self-pay

## 2019-08-16 DIAGNOSIS — F3181 Bipolar II disorder: Secondary | ICD-10-CM | POA: Diagnosis not present

## 2019-08-16 NOTE — Progress Notes (Signed)
      [facility] Counselor/Therapist Progress Note   Patient ID: Paula Elliott,  MRN: [id number] Date:  08/16/19  Timespent: 55 minutes    Treatment Type: Individual  Mental Status Exam:    Appearance:   Casual, appropriate  Behavior:  WNL  Motor:  WNL  Speech/Language:   Clear and Coherent  Affect:  Full range  Mood:  Anxious, depressed  Thought process:  normal  Thought content:   WNL, denies SI/HI  Sensory/Perceptual disturbances:   none  Orientation:  oriented to person, place and time/date  Attention:  Good  Concentration:  Good  Memory:  WNL  Fund of knowledge:   Good  Insight:   developing  Judgment:   developing  Impulse Control:  developing   Reported Symptoms: Depressed mood most days, anxiety, rumination, hx of mood swings, crying spells    Risk Assessment: Danger to Self: No Self-injurious Behavior: Yes (superficial) Danger to Others: No Duty to Warn:no Physical Aggression / Violence:No  Access to Firearms a concern: No  Gang Involvement:No  Patient / guardian was educated about steps to take if suicide or homicide risk level increases between visits. While future psychiatric events cannot be accurately predicted, the patient does not currently require acute inpatient psychiatric care and does not currently meet [facility] involuntary commitment criteria.  Subjective:   Patient engaged in session in no distress.  She shared how she has been staying at a friend's house over the past 3 days.  She stated that they often hang out daily, that she wanted to be away from her parents house for a few days just for some feelings of independence.  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  She shared her ideas, hopes.  Provide praise and support as patient has taken steps toward embracing her writing through an internship.  She feels this may have been other doors  for her in terms of career and fulfillment.  She shared how she had some recent anxiety related to when she was driving and engaged in some scratching behavior, but not to the point where it broke the skin as it was on top of her hand and was visible in session.  We explored other soothing, calming self touch in these situations to utilize.  Patient had difficulty identifying specific triggers for her anxiety while driving but that had been present for the last few years.  She denied any self-injurious behaviors in the last several weeks.  Continued to provide support, understanding working with patient from a cognitive behavioral framework.      Interventions: Cognitive Behavioral Therapy , supportive therapy, strength based approaches  Diagnosis:   ICD-10-CM   1. Bipolar II disorder ([place])  F31.81     Plan:  1.  Patient to continue to engage in individual counseling 2-4 times a month or as needed. 2.  Patient to identify and apply CBT, coping skills learned in session to decrease depression and anxiety symptoms. 3.  Patient to contact this office, go to the local ED or call 911 if a crisis or emergency develops between visits.   Anson [place], [facility]

## 2019-08-22 ENCOUNTER — Ambulatory Visit: Payer: 59 | Admitting: Mental Health

## 2019-08-25 ENCOUNTER — Other Ambulatory Visit: Payer: Self-pay | Admitting: Physician Assistant

## 2019-08-27 ENCOUNTER — Other Ambulatory Visit: Payer: Self-pay | Admitting: Physician Assistant

## 2019-08-29 ENCOUNTER — Ambulatory Visit (INDEPENDENT_AMBULATORY_CARE_PROVIDER_SITE_OTHER): Payer: 59 | Admitting: Physician Assistant

## 2019-08-29 ENCOUNTER — Other Ambulatory Visit: Payer: Self-pay

## 2019-08-29 ENCOUNTER — Ambulatory Visit: Payer: 59 | Admitting: Physician Assistant

## 2019-08-29 ENCOUNTER — Ambulatory Visit: Payer: 59 | Admitting: Mental Health

## 2019-08-29 ENCOUNTER — Encounter: Payer: Self-pay | Admitting: Physician Assistant

## 2019-08-29 DIAGNOSIS — G4700 Insomnia, unspecified: Secondary | ICD-10-CM | POA: Diagnosis not present

## 2019-08-29 DIAGNOSIS — F900 Attention-deficit hyperactivity disorder, predominantly inattentive type: Secondary | ICD-10-CM | POA: Diagnosis not present

## 2019-08-29 DIAGNOSIS — F411 Generalized anxiety disorder: Secondary | ICD-10-CM

## 2019-08-29 DIAGNOSIS — F319 Bipolar disorder, unspecified: Secondary | ICD-10-CM | POA: Diagnosis not present

## 2019-08-29 MED ORDER — DULOXETINE HCL 60 MG PO CPEP: 120.0000 mg | ORAL_CAPSULE | Freq: Every day | ORAL | 2 refills | Status: DC

## 2019-08-29 MED ORDER — ALPRAZOLAM 1 MG PO TABS: 0.5000 mg | ORAL_TABLET | Freq: Three times a day (TID) | ORAL | 2 refills | Status: DC | PRN

## 2019-08-29 MED ORDER — GABAPENTIN 600 MG PO TABS: 600.0000 mg | ORAL_TABLET | Freq: Two times a day (BID) | ORAL | 1 refills | Status: DC

## 2019-08-29 NOTE — Progress Notes (Signed)
[facility] Med Check  Patient ID: Paula Elliott,  MRN: [id number]  PCP: Terald Sleeper, PA-C  Date of Evaluation: 08/29/2019 Time spent:15 minutes  Chief Complaint:  Chief Complaint    Follow-up      HISTORY/CURRENT STATUS: HPI Not doing well.   Had a syncopal episode w/ the increased Clonidine so she stopped the morning dose.  More anxious.  Lost her job.  Will lose her insurance in 11 days because she turns 28 and is on her dad's insurance.  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  In a constant state of anxiety.  On edge.  Xanax has helped but doesn't last long.  It is helping better than the Klonopin did.  Has been out of Seroquel for 4 days.  Her pharmacy never notified her that we sent the prescription in on the 28th.  She has not been able to sleep well.  She slept only 3 hours 2 days ago but still felt energetic.  She has also felt more impulsive and wanted to spend more money even though she does not have any to spend.  She feels very impulsive with her spending right now.  No risky behavior.  She has a blas attitude and does not care about things.  No increased libido.  Does not really feel depressed.  Energy and motivation are low but she feels that is because she has not slept well the past few days.  Denies suicidal or homicidal thoughts.  For about 2 weeks now she has had abnormal taste and smell sensation.  She feels that it is on "hyperdrive."  She is more sensitive to any smell.  And her taste is as well.  She describes it as being able to taste separate things in her food where she was not able to do that before.  She has no fever or chills, headache, cough or other cold symptoms but does state when things taste so weird it causes some nausea but no vomiting.  She was tested several months ago for COVID.  She does not feel that this could possibly be COVID.  She has not  been exposed that she knows of and has not worked for 2 months in the public.  Denies dizziness, syncope, seizures, numbness, tingling, tremor, tics, unsteady gait, slurred speech, confusion. Denies muscle or joint pain, stiffness, or dystonia.  Individual Medical History/ Review of Systems: Changes? :Yes  See above  Past medications for mental health diagnoses include: Wellbutrin, Xanax, Ativan, Provigil, Latuda, Lamictal, Abilify, Prozac, Luvox, Valium  Allergies: Patient has no known allergies.  Current Medications:  Current Outpatient Medications:  .  ALPRAZolam (XANAX) 1 MG tablet, Take 0.5-1 tablets (0.5-1 mg total) by mouth 3 (three) times daily as needed for anxiety., Disp: 90 tablet, Rfl: 2 .  cloNIDine (CATAPRES) 0.1 MG tablet, 1 po  q am, then 2 qhs. (Patient taking differently: 0.1 mg. 2 qhs.), Disp: 90 tablet, Rfl: 2 .  DULoxetine (CYMBALTA) 60 MG capsule, Take 2 capsules (120 mg total) by mouth daily., Disp: 60 capsule, Rfl: 2 .  metFORMIN (GLUCOPHAGE) 1000 MG tablet, TAKE 1 TABLET BY MOUTH EVERY DAY WITH FOOD, Disp: 30 tablet, Rfl: 2 .  QUEtiapine (SEROQUEL) 400 MG tablet, TAKE TWO TABLETS BY MOUTH AT BEDTIME, Disp: 60 tablet, Rfl: 2 .  vitamin B-12 (CYANOCOBALAMIN) 500 MCG tablet, Take 1 tablet daily, Disp: 30 tablet, Rfl: 2 .  zolpidem (AMBIEN) 10 MG tablet, 1 qhs prn sleep, Disp: 30 tablet, Rfl: 2 .  albuterol (VENTOLIN HFA) 108 (90 Base) MCG/ACT inhaler, Inhale 2 puffs into the lungs every 6 (six) hours as needed for wheezing or shortness of breath. (Patient not taking: Reported on 07/20/2019), Disp: 18 g, Rfl: 1 .  furosemide (LASIX) 20 MG tablet, Take 1 tablet (20 mg total) by mouth daily for 3 days., Disp: 3 tablet, Rfl: 1 .  gabapentin (NEURONTIN) 600 MG tablet, Take 1 tablet (600 mg total) by mouth 2 (two) times daily., Disp: 60 tablet, Rfl: 1 .  metoprolol succinate (TOPROL-XL) 25 MG 24 hr tablet, Take 1 tablet (25 mg total) by mouth daily. (Patient not taking: Reported  on 08/29/2019), Disp: 90 tablet, Rfl: 3 .  minocycline (MINOCIN,DYNACIN) 50 MG capsule, Take by mouth daily., Disp: , Rfl: 3 .  potassium chloride (K-DUR) 10 MEQ tablet, Take 1 tablet (10 mEq total) by mouth daily for 3 days., Disp: 3 tablet, Rfl: 1 Medication Side Effects: none  Family Medical/ Social History: Changes? Yes. "I got fired. I was out of work 5 times b/c of my heart issues and they fired me."   MENTAL HEALTH EXAM:  There were no vitals taken for this visit.There is no height or weight on file to calculate BMI.  General Appearance: Casual, Neat, Well Groomed and Obese  Eye Contact:  Good  Speech:  Clear and Coherent  Volume:  Normal  Mood:  Anxious  Affect:  Anxious  Thought Process:  Goal Directed and Descriptions of Associations: Intact  Orientation:  Full (Time, Place, and Person)  Thought Content: Logical   Suicidal Thoughts:  No  Homicidal Thoughts:  No  Memory:  WNL  Judgement:  Good  Insight:  Good  Psychomotor Activity:  Fidgety  Concentration:  Concentration: Good  Recall:  Good  Fund of Knowledge: Good  Language: Good  Assets:  Desire for Improvement  ADL's:  Intact  Cognition: WNL  Prognosis:  Good    DIAGNOSES:    ICD-10-CM   1. Generalized anxiety disorder  F41.1   2. Bipolar I disorder ([place])  F31.9   3. Attention deficit hyperactivity disorder (ADHD), predominantly inattentive type  F90.0   4. Insomnia, unspecified type  G47.00     Receiving Psychotherapy: Yes With [name], [facility]   RECOMMENDATIONS:  We discussed the taste and smell abnormalities.  Anxiety can cause this, however if any COVID symptoms occur, I suggest she get tested again. PDMP was reviewed. Continue Xanax 1 mg 3 times daily as needed. Continue clonidine 0.1 mg 2 nightly. Continue Cymbalta 60 mg, 2 p.o. daily. Increase gabapentin 600 mg to 1 p.o. twice daily. Continue Seroquel 400 mg, 2 nightly. Continue Ambien 10 mg nightly as needed. Continue therapy with [name], [facility] Because she will lose her insurance soon, I have told her that as long as she is doing well she does not have to return until 3 months from now but preferably I would like to see her in 4 weeks.  I understand if she is unable to come in then however.  She should call if not doing well in approximately 4 weeks if she is unable to come in.  I will not let her run out of meds.  She verbalizes understanding. Return in 4 weeks.  Donnal Moat, PA-C

## 2019-08-31 ENCOUNTER — Ambulatory Visit: Payer: 59 | Admitting: Mental Health

## 2019-09-05 ENCOUNTER — Other Ambulatory Visit: Payer: Self-pay

## 2019-09-05 ENCOUNTER — Ambulatory Visit (INDEPENDENT_AMBULATORY_CARE_PROVIDER_SITE_OTHER): Payer: 59 | Admitting: Mental Health

## 2019-09-05 DIAGNOSIS — F319 Bipolar disorder, unspecified: Secondary | ICD-10-CM | POA: Diagnosis not present

## 2019-09-05 NOTE — Progress Notes (Signed)
      [facility] Counselor/Therapist Progress Note   Patient ID: Paula Elliott,  MRN: [id number] Date: 09/05/19  Timespent: 55 minutes    Treatment Type: Individual therapy  Mental Status Exam:    Appearance:   Casual, appropriate  Behavior:  WNL  Motor:  WNL  Speech/Language:   Clear and Coherent  Affect:  Full range  Mood:  Anxious  Thought process:  normal  Thought content:   WNL, denies SI/HI  Sensory/Perceptual disturbances:   none  Orientation:  oriented to person, place and time/date  Attention:  Good  Concentration:  Good  Memory:  WNL  Fund of knowledge:   Good  Insight:   developing  Judgment:   developing  Impulse Control:  developing   Reported Symptoms: Depressed mood most days, anxiety, rumination, hx of mood swings, crying spells    Risk Assessment: Danger to Self: No Self-injurious Behavior: Yes (superficial) Danger to Others: No Duty to Warn:no Physical Aggression / Violence:No  Access to Firearms a concern: No  Gang Involvement:No  Patient / guardian was educated about steps to take if suicide or homicide risk level increases between visits. While future psychiatric events cannot be accurately predicted, the patient does not currently require acute inpatient psychiatric care and does not currently meet [facility] involuntary commitment criteria.  Subjective:   Patient engaged in session in no distress. Pt continues to await unemployment benefits which has been stressful. She will not have insurance coverage after this week. She recently applied for job and is hopeful.  Provide support as she processed feelings related to having increased anxiety from not finding a job at this point.  Patient was encouraged to remind herself of her efforts and the support system she has in place.  She acknowledges that some of her depression has lowered over the past few weeks and this was explored in session.  We reviewed benefits  for daily scheduling and engaging in mindfulness coping such as diaphragmatic breathing exercises.  She plans to follow through between sessions.  She continues to deny any self-injurious behaviors in the last several weeks.  Continued to provide support, understanding working with patient from a cognitive behavioral framework.      Interventions: Cognitive Behavioral Therapy, supportive therapy, strength based approaches  Diagnosis:   ICD-10-CM   1. Bipolar I disorder ([place])  F31.9     Plan:  1.  Patient to continue to engage in individual counseling 2-4 times a month or as needed. 2.  Patient to identify and apply CBT, coping skills learned in session to decrease depression and anxiety symptoms. 3.  Patient to contact this office, go to the local ED or call 911 if a crisis or emergency develops between visits.   Anson [place], [facility]

## 2019-09-13 ENCOUNTER — Ambulatory Visit: Payer: 59 | Admitting: Mental Health

## 2019-09-16 ENCOUNTER — Other Ambulatory Visit: Payer: Self-pay | Admitting: Physician Assistant

## 2019-09-19 ENCOUNTER — Ambulatory Visit: Payer: 59 | Admitting: Mental Health

## 2019-09-24 ENCOUNTER — Other Ambulatory Visit: Payer: Self-pay | Admitting: Physician Assistant

## 2019-09-26 ENCOUNTER — Other Ambulatory Visit: Payer: Self-pay

## 2019-09-26 ENCOUNTER — Encounter: Payer: Self-pay | Admitting: Physician Assistant

## 2019-09-26 ENCOUNTER — Ambulatory Visit (INDEPENDENT_AMBULATORY_CARE_PROVIDER_SITE_OTHER): Payer: 59 | Admitting: Physician Assistant

## 2019-09-26 VITALS — BP 101/87 | HR 104

## 2019-09-26 DIAGNOSIS — F411 Generalized anxiety disorder: Secondary | ICD-10-CM | POA: Diagnosis not present

## 2019-09-26 DIAGNOSIS — G2581 Restless legs syndrome: Secondary | ICD-10-CM | POA: Diagnosis not present

## 2019-09-26 DIAGNOSIS — F900 Attention-deficit hyperactivity disorder, predominantly inattentive type: Secondary | ICD-10-CM

## 2019-09-26 DIAGNOSIS — F319 Bipolar disorder, unspecified: Secondary | ICD-10-CM

## 2019-09-26 MED ORDER — CLONIDINE HCL 0.1 MG PO TABS: ORAL_TABLET | ORAL | 2 refills | Status: DC

## 2019-09-26 MED ORDER — ATOMOXETINE HCL 40 MG PO CAPS: 40.0000 mg | ORAL_CAPSULE | ORAL | 1 refills | Status: DC

## 2019-09-26 NOTE — Progress Notes (Signed)
[facility]  Patient ID: Paula Elliott,  MRN: [id number]  PCP: Terald Sleeper, PA-C  Date of Evaluation: 09/26/2019 Time spent:15 minutes  Chief Complaint:  Chief Complaint    Follow-up      HISTORY/CURRENT STATUS: HPI For routine med check.  Anxiety is better since we increased the Gabapentin.  Xanax helps a lot too. Takes it for Dizziness too.   Her biggest problem right now has not been able to focus on things.  Having a really hard time staying on task and completing projects.  Depression is better but she still has times she doesn't feel like doing anything. Energy and motivation are ok.  Not isolating any more than she has to due to the coronavirus pandemic.  She is still looking for a job.  Putting in a lot of applications but has not had any luck yet.  Denies suicidal or homicidal thoughts.  No recent cutting behaviors.  Patient denies increased energy with decreased need for sleep, no increased talkativeness, no racing thoughts, no impulsivity or risky behaviors, no increased spending, no increased libido, no grandiosity.  Denies syncope, seizures, numbness, tingling, tremor, tics, unsteady gait, slurred speech, confusion. Denies muscle or joint pain, stiffness, or dystonia.  Individual Medical History/ Review of Systems: Changes? :Yes gets dizzy sometimes, also nauseated at times. "Might be from anxiety."   Past medications for mental health diagnoses include: Wellbutrin, Xanax, Ativan, Provigil, Latuda, Lamictal, Abilify, Prozac, Luvox, Valium  Allergies: Patient has no known allergies.  Current Medications:  Current Outpatient Medications:  .  ALPRAZolam (XANAX) 1 MG tablet, Take 0.5-1 tablets (0.5-1 mg total) by mouth 3 (three) times daily as needed for anxiety., Disp: 90 tablet, Rfl: 2 .  cloNIDine (CATAPRES) 0.1 MG tablet, 1 qhs for 1 week, then 1/2 q hs for 1 week, then stop, Disp: 90 tablet, Rfl: 2 .  DULoxetine (CYMBALTA) 60 MG capsule,  Take 2 capsules (120 mg total) by mouth daily., Disp: 60 capsule, Rfl: 2 .  gabapentin (NEURONTIN) 600 MG tablet, Take 1 tablet (600 mg total) by mouth 2 (two) times daily., Disp: 60 tablet, Rfl: 1 .  metFORMIN (GLUCOPHAGE) 1000 MG tablet, TAKE 1 TABLET BY MOUTH EVERY DAY WITH FOOD, Disp: 30 tablet, Rfl: 2 .  QUEtiapine (SEROQUEL) 400 MG tablet, TAKE TWO TABLETS BY MOUTH AT BEDTIME, Disp: 60 tablet, Rfl: 2 .  vitamin B-12 (CYANOCOBALAMIN) 500 MCG tablet, TAKE 1 TABLET BY MOUTH EVERY DAY, Disp: 30 tablet, Rfl: 2 .  zolpidem (AMBIEN) 10 MG tablet, 1 qhs prn sleep, Disp: 30 tablet, Rfl: 2 .  albuterol (VENTOLIN HFA) 108 (90 Base) MCG/ACT inhaler, Inhale 2 puffs into the lungs every 6 (six) hours as needed for wheezing or shortness of breath. (Patient not taking: Reported on 07/20/2019), Disp: 18 g, Rfl: 1 .  atomoxetine (STRATTERA) 40 MG capsule, Take 1 capsule (40 mg total) by mouth every morning., Disp: 30 capsule, Rfl: 1 .  furosemide (LASIX) 20 MG tablet, Take 1 tablet (20 mg total) by mouth daily for 3 days., Disp: 3 tablet, Rfl: 1 .  metoprolol succinate (TOPROL-XL) 25 MG 24 hr tablet, Take 1 tablet (25 mg total) by mouth daily. (Patient not taking: Reported on 08/29/2019), Disp: 90 tablet, Rfl: 3 .  minocycline (MINOCIN,DYNACIN) 50 MG capsule, Take by mouth daily., Disp: , Rfl: 3 .  potassium chloride (K-DUR) 10 MEQ tablet, Take 1 tablet (10 mEq total) by mouth daily for 3 days., Disp: 3 tablet, Rfl: 1 Medication Side Effects:  none  Family Medical/ Social History: Changes? No  MENTAL HEALTH EXAM:  Blood pressure 101/87, pulse (!) 104.There is no height or weight on file to calculate BMI.  General Appearance: Casual, Neat, Well Groomed and Obese  Eye Contact:  Good  Speech:  Clear and Coherent  Volume:  Normal  Mood:  Euthymic  Affect:  Appropriate  Thought Process:  Goal Directed and Descriptions of Associations: Intact  Orientation:  Full (Time, Place, and Person)  Thought Content:  Logical   Suicidal Thoughts:  No  Homicidal Thoughts:  No  Memory:  WNL  Judgement:  Good  Insight:  Good  Psychomotor Activity:  Normal  Concentration:  Concentration: Fair and Attention Span: Fair  Recall:  Good  Fund of Knowledge: Good  Language: Good  Assets:  Desire for Improvement  ADL's:  Intact  Cognition: WNL  Prognosis:  Good    DIAGNOSES:    ICD-10-CM   1. Bipolar I disorder ([place])  F31.9   2. Attention deficit hyperactivity disorder (ADHD), predominantly inattentive type  F90.0   3. Generalized anxiety disorder  F41.1   4. Restless leg syndrome  G25.81     Receiving Psychotherapy: Yes With [name], [facility]  RECOMMENDATIONS:  PDMP was reviewed. Because she is on a benzodiazepine, she cannot take a stimulant as well.  We discussed Strattera and she would like to try it. Start Strattera 40 mg every morning.  Benefits, risk, side effects were discussed and she accepts. Wean off clonidine by taking 0.1 mg nightly for 1 week, then 1/2 pill nightly for 1 week and then stop.  This may be causing the dizziness.  I would recommend that she see her PCP about this however. Continue Cymbalta 60 mg, 2 p.o. daily. Continue gabapentin 600 mg 1 twice daily. Continue Seroquel 400 mg 2 nightly. Continue Ambien 10 mg nightly as needed. Continue therapy with [name], [facility] as often as possible. I understand she will no longer have insurance after the end of this month until she gets a job.  We will tentatively plan for her to be seen in 2 months.  I have assured her that I will continue medication refills until she is able to be seen at least within the next 4 months or so. Return in 2 months.   Donnal Moat, PA-C

## 2019-10-04 ENCOUNTER — Ambulatory Visit: Payer: 59 | Admitting: Mental Health

## 2019-10-12 ENCOUNTER — Other Ambulatory Visit: Payer: Self-pay | Admitting: Physician Assistant

## 2019-10-14 ENCOUNTER — Other Ambulatory Visit: Payer: Self-pay | Admitting: Physician Assistant

## 2019-10-14 NOTE — Telephone Encounter (Signed)
Apt 12/28

## 2019-10-22 ENCOUNTER — Telehealth: Payer: Self-pay | Admitting: Physician Assistant

## 2019-10-22 ENCOUNTER — Other Ambulatory Visit: Payer: Self-pay

## 2019-10-22 MED ORDER — OXCARBAZEPINE 300 MG PO TABS: ORAL_TABLET | ORAL | 1 refills | Status: DC

## 2019-10-22 NOTE — Telephone Encounter (Signed)
Start Trileptal 300 mg #60, 1 po qhs for 4 nights, then 1 bid. Rf1. Keep appt end of Dec.  Thanks

## 2019-10-22 NOTE — Telephone Encounter (Signed)
Is she staying awake all night and sleeping in the day?  Did she try taking the Seroquel before her bedtime, whatever time that is? Is she willing to try that, and cutting the dose, rather than stopping it completely? She could go down to 600 mg for 5 days, then 400 mg.   Another option is to change to Zyprexa 15 mg qhs.  SE increased hunger, possible weight gain, possible increased glucose and cholesterol.  Side effects are really the same as with the Seroquel, Abilify, and Latuda all of which she has tried.Another option would be to add Trileptal.  It can help with the irritability, anger, and any other manic symptoms.Let me know what she would like to do and I will send in the prescription.  I will need her pharmacy.  Thank you.

## 2019-10-22 NOTE — Telephone Encounter (Signed)
Patient would like to talk to you about other options other than seroquel.  She has anew job and can't take the seroquel. So please call her at [phone]

## 2019-10-22 NOTE — Telephone Encounter (Signed)
Patient notified of new Rx, will submit to [facility] per her request. GoodRx price is $9.00 for #60 tablets. Instructed to call back with any concerns.

## 2019-10-31 ENCOUNTER — Ambulatory Visit: Payer: 59 | Admitting: Mental Health

## 2019-11-12 ENCOUNTER — Other Ambulatory Visit: Payer: Self-pay | Admitting: Physician Assistant

## 2019-11-12 ENCOUNTER — Other Ambulatory Visit: Payer: Self-pay

## 2019-11-12 ENCOUNTER — Ambulatory Visit (INDEPENDENT_AMBULATORY_CARE_PROVIDER_SITE_OTHER): Payer: Self-pay | Admitting: Family Medicine

## 2019-11-12 ENCOUNTER — Encounter: Payer: Self-pay | Admitting: Family Medicine

## 2019-11-12 VITALS — BP 129/84 | HR 105 | Temp 98.7°F | Resp 20 | Ht 64.0 in | Wt 216.0 lb

## 2019-11-12 DIAGNOSIS — M62838 Other muscle spasm: Secondary | ICD-10-CM

## 2019-11-12 MED ORDER — METHYLPREDNISOLONE ACETATE 40 MG/ML IJ SUSP
40.0000 mg | Freq: Once | INTRAMUSCULAR | Status: AC
  Administered 2019-11-12: 40 mg via INTRAMUSCULAR

## 2019-11-12 MED ORDER — PREDNISONE 20 MG PO TABS: ORAL_TABLET | ORAL | 0 refills | Status: DC

## 2019-11-12 MED ORDER — CYCLOBENZAPRINE HCL 10 MG PO TABS: 10.0000 mg | ORAL_TABLET | Freq: Three times a day (TID) | ORAL | 1 refills | Status: DC | PRN

## 2019-11-12 NOTE — Patient Instructions (Signed)
Muscle Strain A muscle strain is an injury that happens when a muscle is stretched longer than normal. This can happen during a fall, sports, or lifting. This can tear some muscle fibers. Usually, recovery from muscle strain takes 1-2 weeks. Complete healing normally takes 5-6 weeks. This condition is first treated with PRICE therapy. This involves:  Protecting your muscle from being injured again.  Resting your injured muscle.  Icing your injured muscle.  Applying pressure (compression) to your injured muscle. This may be done with a splint or elastic bandage.  Raising (elevating) your injured muscle. Your doctor may also recommend medicine for pain. Follow these instructions at home: If you have a splint:  Wear the splint as told by your doctor. Take it off only as told by your doctor.  Loosen the splint if your fingers or toes tingle, get numb, or turn cold and blue.  Keep the splint clean.  If the splint is not waterproof: ? Do not let it get wet. ? Cover it with a watertight covering when you take a bath or a shower. Managing pain, stiffness, and swelling   If directed, put ice on your injured area. ? If you have a removable splint, take it off as told by your doctor. ? Put ice in a plastic bag. ? Place a towel between your skin and the bag. ? Leave the ice on for 20 minutes, 2-3 times a day.  Move your fingers or toes often. This helps to avoid stiffness and lessen swelling.  Raise your injured area above the level of your heart while you are sitting or lying down.  Wear an elastic bandage as told by your doctor. Make sure it is not too tight. General instructions  Take over-the-counter and prescription medicines only as told by your doctor.  Limit your activity. Rest your injured muscle as told by your doctor. Your doctor may say that gentle movements are okay.  If physical therapy was prescribed, do exercises as told by your doctor.  Do not put pressure on any  part of the splint until it is fully hardened. This may take many hours.  Do not use any products that contain nicotine or tobacco, such as cigarettes and e-cigarettes. These can delay bone healing. If you need help quitting, ask your doctor.  Warm up before you exercise. This helps to prevent more muscle strains.  Ask your doctor when it is safe to drive if you have a splint.  Keep all follow-up visits as told by your doctor. This is important. Contact a doctor if:  You have more pain or swelling in your injured area. Get help right away if:  You have any of these problems in your injured area: ? You have numbness. ? You have tingling. ? You lose a lot of strength. Summary  A muscle strain is an injury that happens when a muscle is stretched longer than normal.  This condition is first treated with PRICE therapy. This includes protecting, resting, icing, adding pressure, and raising your injury.  Limit your activity. Rest your injured muscle as told by your doctor. Your doctor may say that gentle movements are okay.  Warm up before you exercise. This helps to prevent more muscle strains. This information is not intended to replace advice given to you by your health care provider. Make sure you discuss any questions you have with your health care provider. Document Released: 08/24/2008 Document Revised: 01/11/2019 Document Reviewed: 12/22/2016 Elsevier Patient Education  2020 [employer]vier [employer]  

## 2019-11-12 NOTE — Progress Notes (Signed)
Subjective:  Patient ID: Paula Elliott, female    DOB: 07-Oct-1991, 28 y.o.   MRN: [id number]  Patient Care Team: Theodoro Clock as PCP - General (Physician Assistant) Herminio Commons, MD as PCP - Cardiology (Cardiology)   Chief Complaint:  right shoulder pain (some back pain x 1 mo -no known injury)   HPI: Paula Elliott is a 28 y.o. female presenting on 11/12/2019 for right shoulder pain (some back pain x 1 mo -no known injury)   Muscle Pain This is a new problem. The current episode started 1 to 4 weeks ago. The problem occurs intermittently. The problem has been waxing and waning since onset. The context of the pain is unknown. The pain is present in the neck and right shoulder. The pain is medium. The symptoms are aggravated by any movement. Associated symptoms include stiffness. Pertinent negatives include no abdominal pain, chest pain, constipation, diarrhea, dysuria, eye pain, fatigue, fever, headaches, joint swelling, nausea, rash, sensory change, shortness of breath, swollen glands, urinary symptoms, vaginal discharge, visual change, vomiting, weakness or wheezing. Past treatments include cold pack, OTC NSAID and prescription NSAID. The treatment provided mild relief. There is no swelling present. She has been behaving normally.     Relevant past medical, surgical, family, and social history reviewed and updated as indicated.  Allergies and medications reviewed and updated. Date reviewed: Chart in Epic.   Past Medical History:  Diagnosis Date  . Anxiety   . Depression   . Heart murmur     Past Surgical History:  Procedure Laterality Date  . HIP OPEN REDUCTION  1994  . open heart   1996    Social History   Socioeconomic History  . Marital status: Single    Spouse name: Not on file  . Number of children: Not on file  . Years of education: Not on file  . Highest education level: Not on file  Occupational History  . Not on file    Tobacco Use  . Smoking status: Current Every Day Smoker    Packs/day: 0.50    Types: Cigarettes  . Smokeless tobacco: Never Used  Substance and Sexual Activity  . Alcohol use: Yes    Alcohol/week: 2.0 standard drinks    Types: 2 Glasses of wine per week  . Drug use: No  . Sexual activity: Never  Other Topics Concern  . Not on file  Social History Narrative  . Not on file   Social Determinants of Health   Financial Resource Strain:   . Difficulty of Paying Living Expenses: Not on file  Food Insecurity:   . Worried About Charity fundraiser in the Last Year: Not on file  . Ran Out of Food in the Last Year: Not on file  Transportation Needs:   . Lack of Transportation (Medical): Not on file  . Lack of Transportation (Non-Medical): Not on file  Physical Activity:   . Days of Exercise per Week: Not on file  . Minutes of Exercise per Session: Not on file  Stress:   . Feeling of Stress : Not on file  Social Connections:   . Frequency of Communication with Friends and Family: Not on file  . Frequency of Social Gatherings with Friends and Family: Not on file  . Attends Religious Services: Not on file  . Active Member of Clubs or Organizations: Not on file  . Attends Archivist Meetings: Not on file  . Marital Status:  Not on file  Intimate Partner Violence:   . Fear of Current or Ex-Partner: Not on file  . Emotionally Abused: Not on file  . Physically Abused: Not on file  . Sexually Abused: Not on file    Outpatient Encounter Medications as of 11/12/2019  Medication Sig  . ALPRAZolam (XANAX) 1 MG tablet Take 0.5-1 tablets (0.5-1 mg total) by mouth 3 (three) times daily as needed for anxiety.  Marland Kitchen atomoxetine (STRATTERA) 40 MG capsule Take 1 capsule (40 mg total) by mouth every morning.  . DULoxetine (CYMBALTA) 60 MG capsule Take 2 capsules (120 mg total) by mouth daily.  Marland Kitchen gabapentin (NEURONTIN) 600 MG tablet TAKE 1 TABLET BY MOUTH TWICE DAILY  . metFORMIN  (GLUCOPHAGE) 1000 MG tablet TAKE 1 TABLET BY MOUTH EVERY DAY WITH FOOD  . vitamin B-12 (CYANOCOBALAMIN) 500 MCG tablet TAKE 1 TABLET BY MOUTH EVERY DAY  . zolpidem (AMBIEN) 10 MG tablet TAKE 1 TABLET BY MOUTH AT BEDTIME AS NEEDED FOR SLEEP  . cyclobenzaprine (FLEXERIL) 10 MG tablet Take 1 tablet (10 mg total) by mouth 3 (three) times daily as needed for muscle spasms.  . [DISCONTINUED] albuterol (VENTOLIN HFA) 108 (90 Base) MCG/ACT inhaler Inhale 2 puffs into the lungs every 6 (six) hours as needed for wheezing or shortness of breath. (Patient not taking: Reported on 07/20/2019)  . [DISCONTINUED] cloNIDine (CATAPRES) 0.1 MG tablet 1 qhs for 1 week, then 1/2 q hs for 1 week, then stop  . [DISCONTINUED] furosemide (LASIX) 20 MG tablet Take 1 tablet (20 mg total) by mouth daily for 3 days.  . [DISCONTINUED] metoprolol succinate (TOPROL-XL) 25 MG 24 hr tablet Take 1 tablet (25 mg total) by mouth daily. (Patient not taking: Reported on 08/29/2019)  . [DISCONTINUED] minocycline (MINOCIN,DYNACIN) 50 MG capsule Take by mouth daily.  . [DISCONTINUED] Oxcarbazepine (TRILEPTAL) 300 MG tablet Take 1 tablet (300 mg) by mouth at bedtime for 4 nights, then increase to 1 tablet twice a day  . [DISCONTINUED] potassium chloride (K-DUR) 10 MEQ tablet Take 1 tablet (10 mEq total) by mouth daily for 3 days.  . [DISCONTINUED] predniSONE (DELTASONE) 20 MG tablet 2 po at sametime daily for 5 days  . [DISCONTINUED] QUEtiapine (SEROQUEL) 400 MG tablet TAKE TWO TABLETS BY MOUTH AT BEDTIME   Facility-Administered Encounter Medications as of 11/12/2019  Medication  . methylPREDNISolone acetate (DEPO-MEDROL) injection 40 mg    No Known Allergies  Review of Systems  Constitutional: Negative for activity change, appetite change, chills, diaphoresis, fatigue, fever and unexpected weight change.  HENT: Negative.   Eyes: Negative.  Negative for pain.  Respiratory: Negative for cough, chest tightness, shortness of breath and  wheezing.   Cardiovascular: Negative for chest pain, palpitations and leg swelling.  Gastrointestinal: Negative for abdominal pain, blood in stool, constipation, diarrhea, nausea and vomiting.  Endocrine: Negative.   Genitourinary: Negative for decreased urine volume, difficulty urinating, dysuria, frequency, urgency and vaginal discharge.  Musculoskeletal: Positive for myalgias, neck stiffness and stiffness. Negative for arthralgias, back pain, gait problem, joint swelling and neck pain.  Skin: Negative.  Negative for rash.  Allergic/Immunologic: Negative.   Neurological: Negative for dizziness, sensory change, weakness and headaches.  Hematological: Negative.   Psychiatric/Behavioral: Negative for confusion, hallucinations, sleep disturbance and suicidal ideas.  All other systems reviewed and are negative.       Objective:  BP 129/84   Pulse (!) 105   Temp 98.7 F (37.1 C)   Resp 20   Ht 5\' 4"  (1.626 m)  Wt 216 lb (98 kg)   LMP 11/05/2019   SpO2 98%   BMI 37.08 kg/m    Wt Readings from Last 3 Encounters:  11/12/19 216 lb (98 kg)  03/12/19 232 lb (105.2 kg)  09/13/18 219 lb (99.3 kg)    Physical Exam Vitals and nursing note reviewed.  Constitutional:      General: She is not in acute distress.    Appearance: Normal appearance. She is well-developed and well-groomed. She is not ill-appearing, toxic-appearing or diaphoretic.  HENT:     Head: Normocephalic and atraumatic.     Jaw: There is normal jaw occlusion.     Right Ear: Hearing normal.     Left Ear: Hearing normal.     Nose: Nose normal.     Mouth/Throat:     Lips: Pink.     Mouth: Mucous membranes are moist.     Pharynx: Oropharynx is clear. Uvula midline.  Eyes:     General: Lids are normal.     Extraocular Movements: Extraocular movements intact.     Conjunctiva/sclera: Conjunctivae normal.     Pupils: Pupils are equal, round, and reactive to light.  Neck:     Thyroid: No thyroid mass, thyromegaly or  thyroid tenderness.     Vascular: No carotid bruit or JVD.     Trachea: Trachea and phonation normal.  Cardiovascular:     Rate and Rhythm: Normal rate and regular rhythm.     Chest Wall: PMI is not displaced.     Pulses: Normal pulses.     Heart sounds: Normal heart sounds. No murmur. No friction rub. No gallop.   Pulmonary:     Effort: Pulmonary effort is normal. No respiratory distress.     Breath sounds: Normal breath sounds. No wheezing.  Abdominal:     General: Bowel sounds are normal. There is no distension or abdominal bruit.     Palpations: Abdomen is soft. There is no hepatomegaly or splenomegaly.     Tenderness: There is no abdominal tenderness. There is no right CVA tenderness or left CVA tenderness.     Hernia: No hernia is present.  Musculoskeletal:        General: Normal range of motion.     Right shoulder: Tenderness present. No swelling, deformity, effusion, laceration, bony tenderness or crepitus. Normal range of motion. Normal strength. Normal pulse.       Arms:     Cervical back: Normal range of motion and neck supple. Spasms and tenderness present. No swelling, edema, deformity, erythema, signs of trauma, lacerations, rigidity, torticollis, bony tenderness or crepitus. Pain with movement present. Normal range of motion.     Thoracic back: Normal.     Lumbar back: Normal.     Right lower leg: No edema.     Left lower leg: No edema.  Lymphadenopathy:     Cervical: No cervical adenopathy.  Skin:    General: Skin is warm and dry.     Capillary Refill: Capillary refill takes less than 2 seconds.     Coloration: Skin is not cyanotic, jaundiced or pale.     Findings: No rash.  Neurological:     General: No focal deficit present.     Mental Status: She is alert and oriented to person, place, and time.     Cranial Nerves: Cranial nerves are intact.     Sensory: Sensation is intact. No sensory deficit.     Motor: Motor function is intact. No weakness.  Coordination: Coordination is intact. Coordination normal.     Gait: Gait is intact. Gait normal.     Deep Tendon Reflexes: Reflexes are normal and symmetric. Reflexes normal.     Comments: Grip strength equal  Psychiatric:        Attention and Perception: Attention and perception normal.        Mood and Affect: Mood and affect normal.        Speech: Speech normal.        Behavior: Behavior normal. Behavior is cooperative.        Thought Content: Thought content normal.        Cognition and Memory: Cognition and memory normal.        Judgment: Judgment normal.     Results for orders placed or performed in visit on 06/19/19  Novel Coronavirus, NAA ([facility])  Result Value Ref Range   SARS-CoV-2, NAA Not Detected Not Detected       Pertinent labs & imaging results that were available during my care of the patient were reviewed by me and considered in my medical decision making.  Assessment & Plan:  Paula Elliott was seen today for right shoulder pain.  Diagnoses and all orders for this visit:  Trapezius muscle spasm Spasm of right trapezius muscle. Symptomatic care discussed in detail. Moist heat, new pillow, strengthening and stretching exercises, deep tissue massage, topical rubs like Bio Freeze. Will initiate below as needed for pain and spasm. Pt unable to tolerate oral prednisone due to agitation but willing to take IM injection. Report any new or worsening symptoms. Follow up as needed.  -     cyclobenzaprine (FLEXERIL) 10 MG tablet; Take 1 tablet (10 mg total) by mouth 3 (three) times daily as needed for muscle spasms. -     methylPREDNISolone acetate (DEPO-MEDROL) injection 40 mg     Continue all other maintenance medications.  Follow up plan: Return in about 4 weeks (around 12/10/2019), or if symptoms worsen or fail to improve.  Continue healthy lifestyle choices, including diet (rich in fruits, vegetables, and lean proteins, and low in salt and simple carbohydrates) and  exercise (at least 30 minutes of moderate physical activity daily).  Educational handout given for muscle strain  The above assessment and management plan was discussed with the patient. The patient verbalized understanding of and has agreed to the management plan. Patient is aware to call the clinic if they develop any new symptoms or if symptoms persist or worsen. Patient is aware when to return to the clinic for a follow-up visit. Patient educated on when it is appropriate to go to the emergency department.   Monia Pouch, FNP-C [facility] [phone]

## 2019-11-25 ENCOUNTER — Other Ambulatory Visit: Payer: Self-pay | Admitting: Physician Assistant

## 2019-11-26 ENCOUNTER — Ambulatory Visit (INDEPENDENT_AMBULATORY_CARE_PROVIDER_SITE_OTHER): Payer: 59 | Admitting: Physician Assistant

## 2019-11-26 ENCOUNTER — Ambulatory Visit (INDEPENDENT_AMBULATORY_CARE_PROVIDER_SITE_OTHER): Payer: Self-pay | Admitting: Mental Health

## 2019-11-26 ENCOUNTER — Encounter: Payer: Self-pay | Admitting: Physician Assistant

## 2019-11-26 ENCOUNTER — Other Ambulatory Visit: Payer: Self-pay

## 2019-11-26 DIAGNOSIS — G4700 Insomnia, unspecified: Secondary | ICD-10-CM

## 2019-11-26 DIAGNOSIS — F319 Bipolar disorder, unspecified: Secondary | ICD-10-CM

## 2019-11-26 DIAGNOSIS — G2581 Restless legs syndrome: Secondary | ICD-10-CM

## 2019-11-26 DIAGNOSIS — F900 Attention-deficit hyperactivity disorder, predominantly inattentive type: Secondary | ICD-10-CM

## 2019-11-26 MED ORDER — DULOXETINE HCL 60 MG PO CPEP: 120.0000 mg | ORAL_CAPSULE | Freq: Every day | ORAL | 2 refills | Status: DC

## 2019-11-26 MED ORDER — GABAPENTIN 800 MG PO TABS: 800.0000 mg | ORAL_TABLET | Freq: Two times a day (BID) | ORAL | 1 refills | Status: DC

## 2019-11-26 MED ORDER — METFORMIN HCL 1000 MG PO TABS: 1000.0000 mg | ORAL_TABLET | Freq: Every day | ORAL | 2 refills | Status: DC

## 2019-11-26 MED ORDER — QUETIAPINE FUMARATE 50 MG PO TABS: 50.0000 mg | ORAL_TABLET | Freq: Every day | ORAL | 1 refills | Status: DC

## 2019-11-26 MED ORDER — ATOMOXETINE HCL 80 MG PO CAPS: 80.0000 mg | ORAL_CAPSULE | Freq: Every day | ORAL | 1 refills | Status: DC

## 2019-11-26 MED ORDER — ALPRAZOLAM 1 MG PO TABS: 0.5000 mg | ORAL_TABLET | Freq: Three times a day (TID) | ORAL | 2 refills | Status: DC | PRN

## 2019-11-26 MED ORDER — VITAMIN B-12 500 MCG PO TABS: 500.0000 ug | ORAL_TABLET | Freq: Every day | ORAL | 2 refills | Status: DC

## 2019-11-26 NOTE — Telephone Encounter (Signed)
Apt today at 1pm 

## 2019-11-26 NOTE — Progress Notes (Signed)
      [facility] Counselor/Therapist Progress Note   Patient ID: Paula Elliott,  MRN: [id number] Date: 11/26/19  Timespent: 50 minutes    Treatment Type: Individual therapy  Mental Status Exam:    Appearance:   Casual, appropriate  Behavior:  WNL  Motor:  WNL  Speech/Language:   Clear and Coherent  Affect:  Full range  Mood:  Anxious  Thought process:  normal  Thought content:   WNL, denies SI/HI  Sensory/Perceptual disturbances:   none  Orientation:  oriented to person, place and time/date  Attention:  Good  Concentration:  Good  Memory:  WNL  Fund of knowledge:   Good  Insight:   developing  Judgment:   developing  Impulse Control:  developing   Reported Symptoms: Depressed mood most days, anxiety, rumination, hx of mood swings, crying spells    Risk Assessment: Danger to Self: No Self-injurious Behavior: Yes (superficial) Danger to Others: No Duty to Warn:no Physical Aggression / Violence:No  Access to Firearms a concern: No  Gang Involvement:No  Patient / guardian was educated about steps to take if suicide or homicide risk level increases between visits. While future psychiatric events cannot be accurately predicted, the patient does not currently require acute inpatient psychiatric care and does not currently meet [facility] involuntary commitment criteria.  Subjective:   Patient engaged in session in no apparent distress.  She shared progress since her last session which was a few months ago.  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  She shared how this is lowered her financial stress.  Has afforded her the opportunity to move out of her parents home which has given her some increased independence.  She stated that she continues to have some anxiety and is now having sensory panic attacks.  She stated that her panic attacks can be triggered from certain textures when  touched, certain sounds.  We reviewed coping such as diaphragmatic breathing and mindfulness concepts to utilize between sessions.  She stated she is trying to remind herself to engage in her breathing exercises.  We explored with patient calming self talk to utilize when the attacks seem to begin. She plans to further engage in this practice when the attacks occur.      Interventions: Cognitive Behavioral Therapy, supportive therapy, strength based approaches  Diagnosis:   ICD-10-CM   1. Bipolar I disorder ([place])  F31.9     Plan:  1.  Patient to continue to engage in individual counseling 2-4 times a month or as needed. 2.  Patient to identify and apply CBT, coping skills learned in session to decrease depression and anxiety symptoms. 3.  Patient to contact this office, go to the local ED or call 911 if a crisis or emergency develops between visits.   Anson [place], [facility]

## 2019-11-26 NOTE — Progress Notes (Signed)
[facility] Med Check  Patient ID: Paula Elliott,  MRN: [id number]  PCP: Terald Sleeper, PA-C  Date of Evaluation: 11/26/2019 Time spent:15 minutes  Chief Complaint:  Chief Complaint    Insomnia; Anxiety; Depression; Follow-up      HISTORY/CURRENT STATUS: HPI For routine f/u, having some probs.  She's now staying with an 26 yo lady every night, plus Fri, Sat, and Sun.  The lady plays card games all day and she has to play w/ her every day.  "You would not think it gets old and is tiring but it does."  She stopped the Seroquel because it caused her to sleep too hard.  She was afraid she would not wake up if the lady needed her.  But she is having a lot of trouble going to sleep.  She is able to stay asleep for the most part, once she gets to sleep.  She does wake up sometimes during the night but is able to fall back to sleep usually pretty easily.  Another problem that she has had for approximately 3 months is increased sensation, whether it be something touching her like her covers at night or hearing something, it seems to be "over the top."  The worst thing though is the taste.  Especially with the Cymbalta, she says it smells and taste really bad.  She did notice it before but it has definitely gotten worse in the past 3 months.  "I would like to get COVID if it would take away the horrible smell and taste of this medicine and he can cause other problems."  Her mood is good.  She denies decreased energy or motivation.  She is able to enjoy things.  Denies crying easily.  No suicidal or homicidal thoughts.  Anxiety is controlled.  She reports only taking the Xanax twice a day usually.  The Strattera did seem to help with focus and concentration just a little bit but not a lot.  She still gets distracted easily.  Has a hard time following through with tasks.  No increased energy with decreased need for sleep.  No impulsivity or risky behavior.  No increased libido.  No  grandiosity.  No increased talkativeness.  No increased spending.  No hallucinations.  Denies dizziness, syncope, seizures, numbness, tingling, tremor, tics, unsteady gait, slurred speech, confusion. Denies muscle or joint pain, stiffness, or dystonia.  Individual Medical History/ Review of Systems: Changes? :No   Allergies: Patient has no known allergies.  Current Medications:  Current Outpatient Medications:  .  ALPRAZolam (XANAX) 1 MG tablet, Take 0.5-1 tablets (0.5-1 mg total) by mouth 3 (three) times daily as needed for anxiety., Disp: 90 tablet, Rfl: 2 .  cyclobenzaprine (FLEXERIL) 10 MG tablet, Take 1 tablet (10 mg total) by mouth 3 (three) times daily as needed for muscle spasms., Disp: 30 tablet, Rfl: 1 .  DULoxetine (CYMBALTA) 60 MG capsule, Take 2 capsules (120 mg total) by mouth daily., Disp: 60 capsule, Rfl: 2 .  gabapentin (NEURONTIN) 600 MG tablet, TAKE 1 TABLET BY MOUTH TWICE DAILY, Disp: 60 tablet, Rfl: 1 .  metFORMIN (GLUCOPHAGE) 1000 MG tablet, Take 1 tablet (1,000 mg total) by mouth daily. with food, Disp: 30 tablet, Rfl: 2 .  vitamin B-12 (CYANOCOBALAMIN) 500 MCG tablet, Take 1 tablet (500 mcg total) by mouth daily., Disp: 30 tablet, Rfl: 2 .  atomoxetine (STRATTERA) 80 MG capsule, Take 1 capsule (80 mg total) by mouth daily., Disp: 30 capsule, Rfl: 1 .  gabapentin (NEURONTIN)  800 MG tablet, Take 1 tablet (800 mg total) by mouth 2 (two) times daily., Disp: 60 tablet, Rfl: 1 .  QUEtiapine (SEROQUEL) 50 MG tablet, Take 1 tablet (50 mg total) by mouth at bedtime., Disp: 30 tablet, Rfl: 1 Medication Side Effects: cymbalta smells funny  Family Medical/ Social History: Changes? Yes Has a new job, stays w/ an 26 yo lady every night and all weekend.   MENTAL HEALTH EXAM:  Last menstrual period 11/05/2019.There is no height or weight on file to calculate BMI.  General Appearance: Casual, Neat, Well Groomed and Obese  Eye Contact:  Good  Speech:  Clear and Coherent  Volume:   Normal  Mood:  Euthymic  Affect:  Appropriate  Thought Process:  Goal Directed and Descriptions of Associations: Intact  Orientation:  Full (Time, Place, and Person)  Thought Content: Logical   Suicidal Thoughts:  No  Homicidal Thoughts:  No  Memory:  WNL  Judgement:  Good  Insight:  Good  Psychomotor Activity:  Normal  Concentration:  Concentration: Good  Recall:  Good  Fund of Knowledge: Good  Language: Good  Assets:  Desire for Improvement  ADL's:  Intact  Cognition: WNL  Prognosis:  Good    DIAGNOSES:    ICD-10-CM   1. Bipolar I disorder ([place])  F31.9   2. Attention deficit hyperactivity disorder (ADHD), predominantly inattentive type  F90.0   3. Restless leg syndrome  G25.81   4. Insomnia, unspecified type  G47.00     Receiving Psychotherapy: Yes  [name], [facility]   RECOMMENDATIONS:  I am uncertain of the cause of hypersensitivity to smells, taste, touch, and hearing.  I recommend we increase the gabapentin in hopes that can help with the sensory issues.  I think it is worth a try even though it is off label.  She would like to try it. She needs to be on something for her mood disorder but at this point I understand that taking Seroquel at a high dose is not feasible.  We will have to closely monitor her mood and treat accordingly.  I do recommend we restart that at a low dose just to help her sleep.  She agrees to try. Continue Xanax 1 mg 3 times daily as needed.  Of course to use the smallest dose possible. Increase Strattera to 80 mg p.o. every morning. Continue Cymbalta 60 mg, 2 p.o. daily. Increase gabapentin to 800 mg twice daily. Restart Seroquel at 50 mg nightly. Continue metformin 1000 mg daily.  (Since she is not on a high dose of an atypical antipsychotic, I will reevaluate at the next visit and I may discontinue this.  It was given to help prevent weight gain with the atypical antipsychotic.) Continue therapy with [name], [facility]. Return in 4 to 6  weeks.  Donnal Moat, PA-C

## 2019-12-06 ENCOUNTER — Other Ambulatory Visit: Payer: Self-pay | Admitting: Family Medicine

## 2019-12-06 DIAGNOSIS — M62838 Other muscle spasm: Secondary | ICD-10-CM

## 2019-12-14 ENCOUNTER — Emergency Department (HOSPITAL_COMMUNITY): Payer: No Typology Code available for payment source

## 2019-12-14 ENCOUNTER — Telehealth: Payer: Self-pay | Admitting: Physician Assistant

## 2019-12-14 ENCOUNTER — Other Ambulatory Visit: Payer: Self-pay

## 2019-12-14 ENCOUNTER — Other Ambulatory Visit: Payer: Self-pay | Admitting: Physician Assistant

## 2019-12-14 ENCOUNTER — Encounter (HOSPITAL_COMMUNITY): Payer: Self-pay | Admitting: Emergency Medicine

## 2019-12-14 ENCOUNTER — Emergency Department (HOSPITAL_COMMUNITY)
Admission: EM | Admit: 2019-12-14 | Discharge: 2019-12-14 | Disposition: A | Discharge status: Home / Self Care | Payer: No Typology Code available for payment source | Attending: Emergency Medicine | Admitting: Emergency Medicine

## 2019-12-14 ENCOUNTER — Telehealth: Payer: Self-pay | Admitting: Mental Health

## 2019-12-14 DIAGNOSIS — R413 Other amnesia: Secondary | ICD-10-CM | POA: Diagnosis not present

## 2019-12-14 DIAGNOSIS — Y92410 Unspecified street and highway as the place of occurrence of the external cause: Secondary | ICD-10-CM | POA: Insufficient documentation

## 2019-12-14 DIAGNOSIS — S42021A Displaced fracture of shaft of right clavicle, initial encounter for closed fracture: Secondary | ICD-10-CM | POA: Diagnosis not present

## 2019-12-14 DIAGNOSIS — R1030 Lower abdominal pain, unspecified: Secondary | ICD-10-CM | POA: Insufficient documentation

## 2019-12-14 DIAGNOSIS — Y999 Unspecified external cause status: Secondary | ICD-10-CM | POA: Insufficient documentation

## 2019-12-14 DIAGNOSIS — S4991XA Unspecified injury of right shoulder and upper arm, initial encounter: Secondary | ICD-10-CM | POA: Diagnosis not present

## 2019-12-14 DIAGNOSIS — S30811A Abrasion of abdominal wall, initial encounter: Secondary | ICD-10-CM | POA: Insufficient documentation

## 2019-12-14 DIAGNOSIS — S0990XA Unspecified injury of head, initial encounter: Secondary | ICD-10-CM | POA: Diagnosis not present

## 2019-12-14 DIAGNOSIS — T1490XA Injury, unspecified, initial encounter: Secondary | ICD-10-CM

## 2019-12-14 DIAGNOSIS — S3993XA Unspecified injury of pelvis, initial encounter: Secondary | ICD-10-CM | POA: Diagnosis not present

## 2019-12-14 DIAGNOSIS — F17200 Nicotine dependence, unspecified, uncomplicated: Secondary | ICD-10-CM | POA: Insufficient documentation

## 2019-12-14 DIAGNOSIS — S199XXA Unspecified injury of neck, initial encounter: Secondary | ICD-10-CM | POA: Diagnosis not present

## 2019-12-14 DIAGNOSIS — Y939 Activity, unspecified: Secondary | ICD-10-CM | POA: Insufficient documentation

## 2019-12-14 DIAGNOSIS — S3991XA Unspecified injury of abdomen, initial encounter: Secondary | ICD-10-CM | POA: Diagnosis not present

## 2019-12-14 DIAGNOSIS — S3992XA Unspecified injury of lower back, initial encounter: Secondary | ICD-10-CM | POA: Diagnosis not present

## 2019-12-14 DIAGNOSIS — S299XXA Unspecified injury of thorax, initial encounter: Secondary | ICD-10-CM | POA: Diagnosis not present

## 2019-12-14 DIAGNOSIS — I4510 Unspecified right bundle-branch block: Secondary | ICD-10-CM | POA: Diagnosis not present

## 2019-12-14 LAB — COMPREHENSIVE METABOLIC PANEL
ALT: 17 U/L (ref 0–44)
AST: 26 U/L (ref 15–41)
Albumin: 3.6 g/dL (ref 3.5–5.0)
Alkaline Phosphatase: 62 U/L (ref 38–126)
Anion gap: 14 (ref 5–15)
BUN: 9 mg/dL (ref 6–20)
CO2: 21 mmol/L — ABNORMAL LOW (ref 22–32)
Calcium: 9.3 mg/dL (ref 8.9–10.3)
Chloride: 103 mmol/L (ref 98–111)
Creatinine, Ser: 0.83 mg/dL (ref 0.44–1.00)
GFR calc Af Amer: >60 mL/min (ref >60)
GFR calc non Af Amer: >60 mL/min (ref >60)
Glucose, Bld: 125 mg/dL — ABNORMAL HIGH (ref 70–99)
Potassium: 3.7 mmol/L (ref 3.5–5.1)
Sodium: 138 mmol/L (ref 135–145)
Total Bilirubin: 0.7 mg/dL (ref 0.3–1.2)
Total Protein: 6.6 g/dL (ref 6.5–8.1)

## 2019-12-14 LAB — CBC WITH DIFFERENTIAL/PLATELET
Abs Immature Granulocytes: 0.16 10*3/uL — ABNORMAL HIGH (ref 0.00–0.07)
Basophils Absolute: 0.1 10*3/uL (ref 0.0–0.1)
Basophils Relative: 0 %
Eosinophils Absolute: 0.1 10*3/uL (ref 0.0–0.5)
Eosinophils Relative: 0 %
HCT: 44.8 % (ref 36.0–46.0)
Hemoglobin: 14.2 g/dL (ref 12.0–15.0)
Immature Granulocytes: 1 %
Lymphocytes Relative: 14 %
Lymphs Abs: 3.2 10*3/uL (ref 0.7–4.0)
MCH: 27.3 pg (ref 26.0–34.0)
MCHC: 31.7 g/dL (ref 30.0–36.0)
MCV: 86.2 fL (ref 80.0–100.0)
Monocytes Absolute: 1 10*3/uL (ref 0.1–1.0)
Monocytes Relative: 5 %
Neutro Abs: 18.5 10*3/uL — ABNORMAL HIGH (ref 1.7–7.7)
Neutrophils Relative %: 80 %
Platelets: 287 10*3/uL (ref 150–400)
RBC: 5.2 MIL/uL — ABNORMAL HIGH (ref 3.87–5.11)
RDW: 15.1 % (ref 11.5–15.5)
WBC: 23 10*3/uL — ABNORMAL HIGH (ref 4.0–10.5)
nRBC: 0 % (ref 0.0–0.2)

## 2019-12-14 LAB — I-STAT CHEM 8, ED
BUN: 8 mg/dL (ref 6–20)
Calcium, Ion: 1.08 mmol/L — ABNORMAL LOW (ref 1.15–1.40)
Chloride: 104 mmol/L (ref 98–111)
Creatinine, Ser: 0.7 mg/dL (ref 0.44–1.00)
Glucose, Bld: 122 mg/dL — ABNORMAL HIGH (ref 70–99)
HCT: 44 % (ref 36.0–46.0)
Hemoglobin: 15 g/dL (ref 12.0–15.0)
Potassium: 3.4 mmol/L — ABNORMAL LOW (ref 3.5–5.1)
Sodium: 138 mmol/L (ref 135–145)
TCO2: 21 mmol/L — ABNORMAL LOW (ref 22–32)

## 2019-12-14 LAB — TYPE AND SCREEN
ABO/RH(D): A POS
Antibody Screen: NEGATIVE

## 2019-12-14 LAB — I-STAT BETA HCG BLOOD, ED (MC, WL, AP ONLY): I-stat hCG, quantitative: <5 m[IU]/mL (ref <5)

## 2019-12-14 LAB — ABO/RH: ABO/RH(D): A POS

## 2019-12-14 LAB — PROTIME-INR
INR: 1.1 (ref 0.8–1.2)
Prothrombin Time: 14.3 seconds (ref 11.4–15.2)

## 2019-12-14 LAB — LIPASE, BLOOD: Lipase: 17 U/L (ref 11–51)

## 2019-12-14 LAB — ETHANOL: Alcohol, Ethyl (B): <10 mg/dL (ref <10)

## 2019-12-14 MED ORDER — OXYCODONE-ACETAMINOPHEN 5-325 MG PO TABS: 2.0000 | ORAL_TABLET | Freq: Four times a day (QID) | ORAL | 0 refills | Status: AC | PRN

## 2019-12-14 MED ORDER — FENTANYL CITRATE (PF) 100 MCG/2ML IJ SOLN
50.0000 ug | Freq: Once | INTRAMUSCULAR | Status: AC
  Administered 2019-12-14: 16:00:00 50 ug via INTRAVENOUS
  Filled 2019-12-14: qty 2

## 2019-12-14 MED ORDER — LACTATED RINGERS IV BOLUS
1000.0000 mL | Freq: Once | INTRAVENOUS | Status: AC
  Administered 2019-12-14: 16:00:00 1000 mL via INTRAVENOUS

## 2019-12-14 MED ORDER — IOHEXOL 300 MG/ML  SOLN
100.0000 mL | Freq: Once | INTRAMUSCULAR | Status: AC | PRN
  Administered 2019-12-14: 17:00:00 100 mL via INTRAVENOUS

## 2019-12-14 MED ORDER — LACTATED RINGERS IV BOLUS
1000.0000 mL | Freq: Once | INTRAVENOUS | Status: AC
  Administered 2019-12-14: 20:00:00 1000 mL via INTRAVENOUS

## 2019-12-14 MED ORDER — OXYCODONE-ACETAMINOPHEN 5-325 MG PO TABS
2.0000 | ORAL_TABLET | Freq: Once | ORAL | Status: AC
  Administered 2019-12-14: 20:00:00 2 via ORAL
  Filled 2019-12-14: qty 2

## 2019-12-14 MED ORDER — FENTANYL CITRATE (PF) 100 MCG/2ML IJ SOLN
50.0000 ug | Freq: Once | INTRAMUSCULAR | Status: AC
  Administered 2019-12-14: 19:00:00 50 ug via INTRAVENOUS
  Filled 2019-12-14: qty 2
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
## 2019-12-14 NOTE — Telephone Encounter (Signed)
Pt called asking to talk with TH or CA. She is upset and stated she's having a lot of self harm feelings. Please contact @ [phone]
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
## 2019-12-14 NOTE — Progress Notes (Signed)
TRN note: Pt returned from CT and xray via stretcher with TRN on CCM.

## 2019-12-14 NOTE — Progress Notes (Signed)
Per provider and TRN autumn request, Mother at bedside.

## 2019-12-14 NOTE — Telephone Encounter (Signed)
I called her just now but got VM.  Left a msg that I was checking on her.  Paula Elliott, would you try her again in a little bit? Send her to the ER as discussed above if she can't give [place] a contract for safety.  If she will be staying w/ her parents and states she will not harm herself, go ahead and call in Lithium 150 mg #30, 1 po qhs. No RF.  Thank you

## 2019-12-14 NOTE — ED Notes (Signed)
Discharge instructions including pain control and follow up care discussed with pt and mother at bedside. Pt and mother verbalized understanding. No other questions at this time.   Pt ambulatory to wheelchair. Gait stable.   Pt to go home with mother and father. Mother to care for pt at home. Discussed safety with mother. Mother states she will care for pt "around the clock."   Belonging at bedside returned to pt's mother.

## 2019-12-14 NOTE — Progress Notes (Addendum)
Pt mother given update per daughter request. Mother and father are waiting in the parking lot. Requesting updates when available.   [id number]

## 2019-12-14 NOTE — Telephone Encounter (Signed)
Pt. Did not answer when I tried calling her
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
## 2019-12-14 NOTE — ED Notes (Signed)
TRN note: Pt provided ice water and ccollar removed, ok per provider.   [place] [phone]

## 2019-12-14 NOTE — Progress Notes (Signed)
Per pt request, mother notified and informed that pt is here in the dept.
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
## 2019-12-14 NOTE — Telephone Encounter (Signed)
Paula Elliott and Paula Elliott, just started on the plan here, I spoke with Bridey and she has already "scratched herself."  She is tearful and is considering cutting herself but does not have a plan to kill herself.  She is self-pay and is very worried about the money if she does go to the hospital but is questioning whether she needs to go or not.  I have told her that if she can promise that she will go to her parents, who are very supportive, and stay with them, and we will start lithium, I wont have her go to the emergency room.  She is calling her mom to discuss with her.  Then she will call back later on to let me know what they have decided.  I will check messages and then call her back.  If either of you happen to get a message first, please get her pharmacy number and I can send in the lithium, if she is not going to the ER.  I feel like she will be okay as long as she is able to state her parents.

## 2019-12-14 NOTE — Telephone Encounter (Signed)
noted 
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
## 2019-12-14 NOTE — Progress Notes (Signed)
Orthopedic Tech Progress Note Patient Details:  Paula Elliott 02-12-1991 [id number]  Ortho Devices Type of Ortho Device: Arm sling Ortho Device/Splint Location: RUE Ortho Device/Splint Interventions: Application, Ordered   Post Interventions Patient Tolerated: Well Instructions Provided: Care of device, Adjustment of device   Janit Pagan 12/14/2019, 8:33 PM

## 2019-12-17 ENCOUNTER — Encounter: Payer: Self-pay | Admitting: Physician Assistant

## 2019-12-17 ENCOUNTER — Other Ambulatory Visit: Payer: Self-pay | Admitting: Physician Assistant

## 2019-12-17 MED ORDER — LITHIUM CARBONATE 150 MG PO CAPS: 150.0000 mg | ORAL_CAPSULE | Freq: Every day | ORAL | 0 refills | Status: DC

## 2019-12-17 NOTE — Telephone Encounter (Signed)
Albina Billet, would you give Alayasia a call and see how she's doing?  Thanks
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
## 2019-12-17 NOTE — Telephone Encounter (Signed)
Lithium Rx sent to [facility]

## 2019-12-18 DIAGNOSIS — S42001A Fracture of unspecified part of right clavicle, initial encounter for closed fracture: Secondary | ICD-10-CM | POA: Diagnosis not present

## 2019-12-19 DIAGNOSIS — S42001A Fracture of unspecified part of right clavicle, initial encounter for closed fracture: Secondary | ICD-10-CM | POA: Diagnosis not present

## 2019-12-20 DIAGNOSIS — X58XXXA Exposure to other specified factors, initial encounter: Secondary | ICD-10-CM | POA: Diagnosis not present

## 2019-12-20 DIAGNOSIS — Y999 Unspecified external cause status: Secondary | ICD-10-CM | POA: Diagnosis not present

## 2019-12-20 DIAGNOSIS — S42021A Displaced fracture of shaft of right clavicle, initial encounter for closed fracture: Secondary | ICD-10-CM | POA: Diagnosis not present

## 2019-12-20 DIAGNOSIS — S42001A Fracture of unspecified part of right clavicle, initial encounter for closed fracture: Secondary | ICD-10-CM | POA: Diagnosis not present

## 2019-12-20 DIAGNOSIS — G8918 Other acute postprocedural pain: Secondary | ICD-10-CM | POA: Diagnosis not present

## 2019-12-25 ENCOUNTER — Ambulatory Visit (INDEPENDENT_AMBULATORY_CARE_PROVIDER_SITE_OTHER): Payer: BC Managed Care – PPO | Admitting: Mental Health

## 2019-12-25 ENCOUNTER — Other Ambulatory Visit: Payer: Self-pay

## 2019-12-25 DIAGNOSIS — F319 Bipolar disorder, unspecified: Secondary | ICD-10-CM | POA: Diagnosis not present
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
## 2019-12-26 ENCOUNTER — Other Ambulatory Visit: Payer: Self-pay | Admitting: Physician Assistant

## 2019-12-26 NOTE — Telephone Encounter (Signed)
Has apt 01/28, last refill 01/07. Still taking?

## 2019-12-27 ENCOUNTER — Encounter: Payer: Self-pay | Admitting: Physician Assistant

## 2019-12-27 ENCOUNTER — Other Ambulatory Visit: Payer: Self-pay | Admitting: Physician Assistant

## 2019-12-27 ENCOUNTER — Ambulatory Visit (INDEPENDENT_AMBULATORY_CARE_PROVIDER_SITE_OTHER): Payer: BC Managed Care – PPO | Admitting: Physician Assistant

## 2019-12-27 DIAGNOSIS — F331 Major depressive disorder, recurrent, moderate: Secondary | ICD-10-CM

## 2019-12-27 DIAGNOSIS — F5105 Insomnia due to other mental disorder: Secondary | ICD-10-CM

## 2019-12-27 DIAGNOSIS — F411 Generalized anxiety disorder: Secondary | ICD-10-CM | POA: Diagnosis not present

## 2019-12-27 DIAGNOSIS — F99 Mental disorder, not otherwise specified: Secondary | ICD-10-CM

## 2019-12-27 DIAGNOSIS — R432 Parageusia: Secondary | ICD-10-CM | POA: Diagnosis not present

## 2019-12-27 MED ORDER — LITHIUM CARBONATE 150 MG PO CAPS: 150.0000 mg | ORAL_CAPSULE | Freq: Every day | ORAL | 0 refills | Status: DC

## 2019-12-27 MED ORDER — ZOLPIDEM TARTRATE ER 12.5 MG PO TBCR: 12.5000 mg | EXTENDED_RELEASE_TABLET | Freq: Every evening | ORAL | 0 refills | Status: DC | PRN

## 2019-12-27 NOTE — Telephone Encounter (Signed)
Patient called and said she needs a refill on her trileptal 300 mg to be sent to [facility].
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
## 2019-12-28 NOTE — Telephone Encounter (Signed)
She was seen yesterday but I don't see it listed?

## 2019-12-31 ENCOUNTER — Other Ambulatory Visit: Payer: Self-pay | Admitting: Physician Assistant

## 2019-12-31 DIAGNOSIS — S42001D Fracture of unspecified part of right clavicle, subsequent encounter for fracture with routine healing: Secondary | ICD-10-CM | POA: Diagnosis not present

## 2020-01-04 DIAGNOSIS — M5442 Lumbago with sciatica, left side: Secondary | ICD-10-CM | POA: Diagnosis not present

## 2020-01-11 DIAGNOSIS — M48061 Spinal stenosis, lumbar region without neurogenic claudication: Secondary | ICD-10-CM | POA: Diagnosis not present

## 2020-01-11 DIAGNOSIS — M5126 Other intervertebral disc displacement, lumbar region: Secondary | ICD-10-CM | POA: Diagnosis not present

## 2020-01-11 DIAGNOSIS — M4316 Spondylolisthesis, lumbar region: Secondary | ICD-10-CM | POA: Diagnosis not present

## 2020-01-11 DIAGNOSIS — M545 Low back pain: Secondary | ICD-10-CM | POA: Diagnosis not present

## 2020-01-16 ENCOUNTER — Other Ambulatory Visit: Payer: Self-pay | Admitting: Physician Assistant

## 2020-01-18 ENCOUNTER — Ambulatory Visit (INDEPENDENT_AMBULATORY_CARE_PROVIDER_SITE_OTHER): Payer: BC Managed Care – PPO | Admitting: Mental Health

## 2020-01-18 DIAGNOSIS — F411 Generalized anxiety disorder: Secondary | ICD-10-CM | POA: Diagnosis not present

## 2020-01-18 DIAGNOSIS — F331 Major depressive disorder, recurrent, moderate: Secondary | ICD-10-CM | POA: Diagnosis not present
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
## 2020-01-21 DIAGNOSIS — Z6834 Body mass index (BMI) 34.0-34.9, adult: Secondary | ICD-10-CM | POA: Diagnosis not present

## 2020-01-21 DIAGNOSIS — R030 Elevated blood-pressure reading, without diagnosis of hypertension: Secondary | ICD-10-CM | POA: Diagnosis not present

## 2020-01-21 DIAGNOSIS — M4307 Spondylolysis, lumbosacral region: Secondary | ICD-10-CM | POA: Diagnosis not present

## 2020-01-24 ENCOUNTER — Ambulatory Visit (INDEPENDENT_AMBULATORY_CARE_PROVIDER_SITE_OTHER): Payer: BC Managed Care – PPO | Admitting: Physician Assistant

## 2020-01-24 ENCOUNTER — Encounter: Payer: Self-pay | Admitting: Physician Assistant

## 2020-01-24 DIAGNOSIS — F410 Panic disorder [episodic paroxysmal anxiety] without agoraphobia: Secondary | ICD-10-CM | POA: Diagnosis not present

## 2020-01-24 DIAGNOSIS — G4700 Insomnia, unspecified: Secondary | ICD-10-CM

## 2020-01-24 DIAGNOSIS — F411 Generalized anxiety disorder: Secondary | ICD-10-CM

## 2020-01-24 DIAGNOSIS — F900 Attention-deficit hyperactivity disorder, predominantly inattentive type: Secondary | ICD-10-CM

## 2020-01-24 DIAGNOSIS — F3181 Bipolar II disorder: Secondary | ICD-10-CM | POA: Diagnosis not present

## 2020-01-24 MED ORDER — LITHIUM CARBONATE 150 MG PO CAPS: 150.0000 mg | ORAL_CAPSULE | Freq: Every day | ORAL | 0 refills | Status: DC

## 2020-01-24 MED ORDER — BUSPIRONE HCL 15 MG PO TABS: ORAL_TABLET | ORAL | 1 refills | Status: DC
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
## 2020-01-28 DIAGNOSIS — S42001D Fracture of unspecified part of right clavicle, subsequent encounter for fracture with routine healing: Secondary | ICD-10-CM | POA: Diagnosis not present

## 2020-02-04 DIAGNOSIS — M4307 Spondylolysis, lumbosacral region: Secondary | ICD-10-CM | POA: Diagnosis not present

## 2020-02-04 DIAGNOSIS — M5416 Radiculopathy, lumbar region: Secondary | ICD-10-CM | POA: Diagnosis not present

## 2020-02-04 DIAGNOSIS — M5126 Other intervertebral disc displacement, lumbar region: Secondary | ICD-10-CM | POA: Diagnosis not present

## 2020-02-07 ENCOUNTER — Ambulatory Visit (INDEPENDENT_AMBULATORY_CARE_PROVIDER_SITE_OTHER): Payer: BC Managed Care – PPO | Admitting: Mental Health

## 2020-02-07 ENCOUNTER — Other Ambulatory Visit: Payer: Self-pay

## 2020-02-07 DIAGNOSIS — F3181 Bipolar II disorder: Secondary | ICD-10-CM | POA: Diagnosis not present

## 2020-02-07 DIAGNOSIS — F410 Panic disorder [episodic paroxysmal anxiety] without agoraphobia: Secondary | ICD-10-CM | POA: Diagnosis not present

## 2020-02-10 NOTE — Progress Notes (Signed)
      [facility] Counselor/Therapist Progress Note   Patient ID: Paula Elliott,  MRN: [id number] Date:   02/07/20  Timespent: 55 minutes    Treatment Type: Individual therapy  Mental Status Exam:    Appearance:   Casual, appropriate  Behavior:  WNL  Motor:  WNL  Speech/Language:   Clear and Coherent  Affect:  Full range  Mood:  Anxious  Thought process:  normal  Thought content:   WNL, denies SI/HI  Sensory/Perceptual disturbances:   none  Orientation:  oriented to person, place and time/date  Attention:  Good  Concentration:  Good  Memory:  WNL  Fund of knowledge:   Good  Insight:   developing  Judgment:   developing  Impulse Control:  developing   Reported Symptoms: Depressed mood most days, anxiety, rumination, hx of mood swings, crying spells    Risk Assessment: Danger to Self: No Self-injurious Behavior: Yes (superficial) Danger to Others: No Duty to Warn:no Physical Aggression / Violence:No  Access to Firearms a concern: No  Gang Involvement:No  Patient / guardian was educated about steps to take if suicide or homicide risk level increases between visits. While future psychiatric events cannot be accurately predicted, the patient does not currently require acute inpatient psychiatric care and does not currently meet [facility] involuntary commitment criteria.  Subjective:   Patient arrived on time for today's session in no distress.  Shared progress where she shared a recent experience with a date she went on with a person she met and had spoken to 4 a few weeks.  She was hopeful about the situation, but only to find that this person had difficulty being totally honest with her and how this affected her.  She shared how she is "moving on" and how she has talked to some other people and is also planning to try and go out on the date again soon.  Patient would like a relationship with mutual respect and honesty, but has found  the process frustrating over the years where we normalized some of these feelings as dating can be challenging.  She appears hopeful, still motivated and continues to also look for employment.  Some continued family stress as resurfaced from her parents who she stated make judgmental comments from time to time which makes it difficult living at home.  She continues to take steps toward independence, ultimately wanting her own residence, feeling that at this point in her life is needed and will be a healthy step for everyone.  She plans to take continue steps toward finding full-time work over the next few weeks.  Through discovery, she identified ways that she plans to communicate directly with her parents while being respectful to them.   Interventions: Cognitive Behavioral Therapy, supportive therapy, strength based approaches  Diagnosis:   ICD-10-CM   1. Bipolar II disorder ([place])  F31.81   2. Panic disorder  F41.0     Plan:  1.  Patient to continue to engage in individual counseling 2-4 times a month or as needed. 2.  Patient to identify and apply CBT, coping skills learned in session to decrease depression and anxiety symptoms. 3.  Patient to contact this office, go to the local ED or call 911 if a crisis or emergency develops between visits.   Anson [place], [facility]

## 2020-02-18 ENCOUNTER — Other Ambulatory Visit: Payer: Self-pay | Admitting: Physician Assistant

## 2020-02-18 ENCOUNTER — Ambulatory Visit: Payer: BC Managed Care – PPO | Admitting: Mental Health

## 2020-02-18 ENCOUNTER — Other Ambulatory Visit: Payer: Self-pay

## 2020-02-18 DIAGNOSIS — F3181 Bipolar II disorder: Secondary | ICD-10-CM

## 2020-02-18 NOTE — Progress Notes (Signed)
      [facility] Counselor/Therapist Progress Note   Patient ID: Paula Elliott,  MRN: [id number] Date:   02/18/20  Timespent: 53 minutes    Treatment Type: Individual therapy  Mental Status Exam:    Appearance:   Casual, appropriate  Behavior:  WNL  Motor:  WNL  Speech/Language:   Clear and Coherent  Affect:  Full range  Mood:  Anxious  Thought process:  normal  Thought content:   WNL, denies SI/HI  Sensory/Perceptual disturbances:   none  Orientation:  oriented to person, place and time/date  Attention:  Good  Concentration:  Good  Memory:  WNL  Fund of knowledge:   Good  Insight:   developing  Judgment:   developing  Impulse Control:  developing   Reported Symptoms: Depressed mood most days, anxiety, rumination, hx of mood swings, crying spells    Risk Assessment: Danger to Self: No Self-injurious Behavior: Yes (superficial) Danger to Others: No Duty to Warn:no Physical Aggression / Violence:No  Access to Firearms a concern: No  Gang Involvement:No  Patient / guardian was educated about steps to take if suicide or homicide risk level increases between visits. While future psychiatric events cannot be accurately predicted, the patient does not currently require acute inpatient psychiatric care and does not currently meet [facility] involuntary commitment criteria.  Subjective:   Patient arrived on time for today's session which was scheduled earlier today per her her request.  She shared some recent family stressors, specifically with the relationship with her father where he made some comments that were hurtful.  Time spent to allow patient to fully process the event as well as their relationship since.  The relationship continues to remain strained.  She shared how she was able to have a discussion with her mother where her mother apologize for initially not acknowledging patient's experiences and subsequent hurt feelings from  her father's comments.  Assisted her in identifying some of her own beliefs versus how her father made her feel recently.  She continues to look for employment which has been a struggle but she expressed motivation to continue the search as she ultimately wants to wants to gain more independence for herself giving her the opportunity to move out of her parents home.   Interventions: Cognitive Behavioral Therapy, supportive therapy, strength based approaches  Diagnosis:   ICD-10-CM   1. Bipolar II disorder ([place])  F31.81     Plan:  1.  Patient to continue to engage in individual counseling 2-4 times a month or as needed. 2.  Patient to identify and apply CBT, coping skills learned in session to decrease depression and anxiety symptoms. 3.  Patient to contact this office, go to the local ED or call 911 if a crisis or emergency develops between visits.   Anson [place], [facility]

## 2020-02-18 NOTE — Telephone Encounter (Signed)
Last apt 02/25 due back 4-6 weeks

## 2020-02-20 DIAGNOSIS — M5416 Radiculopathy, lumbar region: Secondary | ICD-10-CM | POA: Diagnosis not present

## 2020-02-20 DIAGNOSIS — Z6835 Body mass index (BMI) 35.0-35.9, adult: Secondary | ICD-10-CM | POA: Diagnosis not present

## 2020-02-20 DIAGNOSIS — R030 Elevated blood-pressure reading, without diagnosis of hypertension: Secondary | ICD-10-CM | POA: Diagnosis not present

## 2020-02-20 DIAGNOSIS — M5126 Other intervertebral disc displacement, lumbar region: Secondary | ICD-10-CM | POA: Diagnosis not present

## 2020-02-22 ENCOUNTER — Other Ambulatory Visit: Payer: Self-pay

## 2020-02-22 MED ORDER — LITHIUM CARBONATE 150 MG PO CAPS: 150.0000 mg | ORAL_CAPSULE | Freq: Every day | ORAL | 0 refills | Status: DC

## 2020-02-23 ENCOUNTER — Other Ambulatory Visit: Payer: Self-pay | Admitting: Physician Assistant

## 2020-02-28 ENCOUNTER — Ambulatory Visit (INDEPENDENT_AMBULATORY_CARE_PROVIDER_SITE_OTHER): Payer: BC Managed Care – PPO | Admitting: Mental Health

## 2020-02-28 DIAGNOSIS — F3181 Bipolar II disorder: Secondary | ICD-10-CM | POA: Diagnosis not present

## 2020-02-28 NOTE — Progress Notes (Signed)
[facility] Counselor/Therapist Progress Note   Patient ID: Paula Elliott,  MRN: [id number] Date:   02/28/20  Timespent: 54 minutes    Treatment Type: Individual therapy  Virtual Visit via Telephone Note Connected with patient by a video enabled telemedicine/telehealth application or telephone, with their informed consent, and verified patient privacy and that I am speaking with the correct person using two identifiers. I discussed the limitations, risks, security and privacy concerns of performing psychotherapy and management service by telephone and the availability of in person appointments. I also discussed with the patient that there may be a patient responsible charge related to this service. The patient expressed understanding and agreed to proceed. I discussed the treatment planning with the patient. The patient was provided an opportunity to ask questions and all were answered. The patient agreed with the plan and demonstrated an understanding of the instructions. The patient was advised to call  our office if  symptoms worsen or feel they are in a crisis state and need immediate contact.   Therapist Location: office Patient Location: home  Mental Status Exam:    Appearance:   Casual, appropriate  Behavior:  WNL  Motor:  WNL  Speech/Language:   Clear and Coherent  Affect:  Full range  Mood:  Anxious  Thought process:  normal  Thought content:   WNL, denies SI/HI  Sensory/Perceptual disturbances:   none  Orientation:  oriented to person, place and time/date  Attention:  Good  Concentration:  Good  Memory:  WNL  Fund of knowledge:   Good  Insight:   developing  Judgment:   developing  Impulse Control:  developing   Reported Symptoms: Depressed mood most days, anxiety, rumination, hx of mood swings, crying spells    Risk Assessment: Danger to Self: No Self-injurious Behavior: Yes (superficial) Danger to Others: No Duty to  Warn:no Physical Aggression / Violence:No  Access to Firearms a concern: No  Gang Involvement:No  Patient / guardian was educated about steps to take if suicide or homicide risk level increases between visits. While future psychiatric events cannot be accurately predicted, the patient does not currently require acute inpatient psychiatric care and does not currently meet [facility] involuntary commitment criteria.  Subjective:   Patient engaged in telehealth session. She shared how she continues to be under a lot of stress. Continues to have strained relationship w/ her father due to  the argument they had a few weeks ago. She shared how her mother told her  She wants her parents to understand her mental health challenges. This confuses her as her father has had significant mental health issues in the past.  She reflected back to when she was age 29 to 60 coping w/ anxiety, vomiting at that early age when going to school, later her grades, if she made a B she would get very upset/ anxious.  She has been using THC and ETOH more recently, due to the situation w/ her father.  Explored with patient ways to facilitate change from her current cycle of increased feelings of anxiety, depression.  She identified some ways to set boundaries and some relationships, specifically with her cousin to follow through on between sessions.  She plans also follow through and continued ways to assert herself and sharing her needs and her relationship with her parents related to their relationship relational stress.    Interventions: Cognitive Behavioral Therapy, supportive therapy, strength based approaches  Diagnosis:   ICD-10-CM   1. Bipolar II  disorder ([place])  F31.81     Plan:  1.  Patient to continue to engage in individual counseling 2-4 times a month or as needed. 2.  Patient to identify and apply CBT, coping skills learned in session to decrease depression and anxiety symptoms. 3.  Patient to contact  this office, go to the local ED or call 911 if a crisis or emergency develops between visits.   Anson [place], [facility]

## 2020-03-02 ENCOUNTER — Other Ambulatory Visit: Payer: Self-pay | Admitting: Physician Assistant

## 2020-03-10 ENCOUNTER — Other Ambulatory Visit: Payer: Self-pay | Admitting: Physician Assistant

## 2020-03-18 ENCOUNTER — Other Ambulatory Visit: Payer: Self-pay

## 2020-03-18 ENCOUNTER — Ambulatory Visit (INDEPENDENT_AMBULATORY_CARE_PROVIDER_SITE_OTHER): Payer: BC Managed Care – PPO | Admitting: Mental Health

## 2020-03-18 DIAGNOSIS — F3181 Bipolar II disorder: Secondary | ICD-10-CM | POA: Diagnosis not present

## 2020-03-18 NOTE — Progress Notes (Signed)
[facility] Counselor/Therapist Progress Note   Patient ID: Paula Elliott,  MRN: [id number] Date:   03/18/20  Timespent: 54 minutes    Treatment Type: Individual therapy  Mental Status Exam:    Appearance:   Casual, appropriate  Behavior:  WNL  Motor:  WNL  Speech/Language:   Clear and Coherent  Affect:  Full range  Mood:  Anxious, depressed  Thought process:  normal  Thought content:   WNL, denies SI/HI  Sensory/Perceptual disturbances:   none  Orientation:  oriented to person, place and time/date  Attention:  Good  Concentration:  Good  Memory:  WNL  Fund of knowledge:   Good  Insight:   developing  Judgment:   developing  Impulse Control:  developing   Reported Symptoms: Depressed mood most days, anxiety, rumination, hx of mood swings, crying spells    Risk Assessment: Danger to Self: No Self-injurious Behavior:  Positive history, none in the past few weeks Danger to Others: No Duty to Warn:no Physical Aggression / Violence:No  Access to Firearms a concern: No  Gang Involvement:No  Patient / guardian was educated about steps to take if suicide or homicide risk level increases between visits. While future psychiatric events cannot be accurately predicted, the patient does not currently require acute inpatient psychiatric care and does not currently meet [facility] involuntary commitment criteria.  Subjective:   Patient shared progress, she continues to look for jobs but has not gotten any interviews and expressed feelings of frustration and sadness about the struggle to obtain employment.  She shared the relationship with her parents, she and her father are now on speaking terms but she stated generally she stays in her room and she talks minimally to her mother and father.  She continues to identify the need to gain independence defined as obtaining employment and at some point being able to move out of their home.  She  shared how she was able to spend time with a friend recently, took a trip out of town, this was enjoyable.  Continues to cope with some sensory panic episodes, as well as low motivation.  She has been drinking more and using cannabis most days.  She shared how her thinking and focus is not a sharp we discussed how using substances and alcohol can decrease effectiveness of her medication.  She stated she has been out of one of her medications for the past 3 days, Cymbalta but plans to get this filled later this evening.  Discussed coping, getting back to her exercise regimen that she stopped about 3 weeks ago.  Through guided discovery, she identified a lack of purpose, feeling that at her age she should have more accomplished.  We discussed looking for other jobs that she may be overqualified for to fill her time while looking for full-time employment.  She shared how she tries to ground herself and logical self talk and we assist her in this continued process during the session encouraged her to follow through between sessions to decrease anxiety and feelings of depression.    Interventions: Cognitive Behavioral Therapy, supportive therapy, strength based approaches  Diagnosis:   ICD-10-CM   1. Bipolar II disorder ([place])  F31.81    Plan: Patient is to use CBT, mindfulness and coping skills to help manage decrease symptoms associated with their diagnosis.  Patient to follow through with getting more exercise, adhering to a schedule.  Patient to continue to challenge some of her negative thoughts that contribute  to her anxiety and feelings of depression.   Long-term goal:   Reduce overall level, frequency, and intensity of the feelings of depression and anxiety 7/10 to a 0-2/10 in severity for at least 3 consecutive months.   Short-term goal:  Decrease "deflating, self-doubting" and catastrophizing thinking style Decrease anxiety producing self talk such as thinking of the worse possible life  outcomes Decrease panic episodes by utilizing coping skills discussed in session.    Assessment of progress:  progressing   Anson [place], [facility]

## 2020-03-19 ENCOUNTER — Other Ambulatory Visit: Payer: Self-pay | Admitting: Physician Assistant

## 2020-03-20 NOTE — Telephone Encounter (Signed)
Last apt 02/25 due back 4-6 weeks

## 2020-03-31 ENCOUNTER — Ambulatory Visit: Payer: BC Managed Care – PPO | Admitting: Mental Health

## 2020-04-14 ENCOUNTER — Other Ambulatory Visit: Payer: Self-pay | Admitting: Physician Assistant

## 2020-04-14 ENCOUNTER — Ambulatory Visit (INDEPENDENT_AMBULATORY_CARE_PROVIDER_SITE_OTHER): Payer: BC Managed Care – PPO | Admitting: Mental Health

## 2020-04-14 ENCOUNTER — Other Ambulatory Visit: Payer: Self-pay

## 2020-04-14 DIAGNOSIS — F3181 Bipolar II disorder: Secondary | ICD-10-CM | POA: Diagnosis not present

## 2020-04-14 DIAGNOSIS — F411 Generalized anxiety disorder: Secondary | ICD-10-CM

## 2020-04-14 NOTE — Telephone Encounter (Signed)
Apt 05/18

## 2020-04-14 NOTE — Progress Notes (Signed)
[facility] Counselor/Therapist Progress Note   Patient ID: Paula Elliott,  MRN: [id number] Date:  04/14/20  Timespent: 53 minutes    Treatment Type: Individual therapy  Mental Status Exam:    Appearance:   Casual, appropriate  Behavior:  WNL  Motor:  WNL  Speech/Language:   Clear and Coherent  Affect:  Full range  Mood:  Anxious, depressed  Thought process:  normal  Thought content:   WNL, denies SI/HI  Sensory/Perceptual disturbances:   none  Orientation:  oriented to person, place and time/date  Attention:  Good  Concentration:  Good  Memory:  WNL  Fund of knowledge:   Good  Insight:   developing  Judgment:   developing  Impulse Control:  developing   Reported Symptoms: Depressed mood most days, anxiety, rumination, hx of mood swings, crying spells    Risk Assessment: Danger to Self: No Self-injurious Behavior:  Positive history, none in the past few weeks Danger to Others: No Duty to Warn:no Physical Aggression / Violence:No  Access to Firearms a concern: No  Gang Involvement:No  Patient / guardian was educated about steps to take if suicide or homicide risk level increases between visits. While future psychiatric events cannot be accurately predicted, the patient does not currently require acute inpatient psychiatric care and does not currently meet [facility] involuntary commitment criteria.  Subjective:   Patient presents for session, sharing progress.  She continues to cope with anxiety, reports feeling paranoid at times, may hear someone walking behind her in grocery store, turns around to find no one there.  She says she is finding herself ruminating, constantly worrying when she leaves him if somebody would break into their home while she is gone.  She stated this is never happened and she tries to remind herself of this fact however, she reports this is been increasing in the last 1 to 2 months but has coped with  this for the past few years.  She stated that she does not worry when someone's at home which is often her mother.  She stated her parents are away on vacation and she has noticed this reoccurring every time she leaves which is about 4-5 times over the past 2 weeks.  She stated that she has been isolating more at home, not going to her cousin's home as often as she wants was but still sees her from time to time.  She shared more details regarding their relationship, where she stated her cousin is mildly supportive and tends to focus on herself and her own issues.  She continues to seek employment, had a job interview about a week ago but learned that she was not selected for the position.  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  Family relationships were assessed, feelings processed.  Reports getting along better with her father since last session.  She shared how she has recently been questioning her mental health symptoms, "sometimes I feel like I am just in my head".  She stated that she is trying to figure out why she copes with this level of anxiety, denies having any significant traumatic history that would suggest some of what she experiences.  We discussed how this is how the brain can work, that it can be difficult to find congruency with emotional reaction/intensity and triggers.  She continues to take her medications as prescribed and we encouraged her to do so and report any concerns as needed.  Encouraged her to continue to make  attempts to leave her home to decrease isolated behavior and work to identify calming self talk.    Interventions: Cognitive Behavioral Therapy, supportive therapy, strength based approaches  Diagnosis:   ICD-10-CM   1. Bipolar II disorder ([place])  F31.81   2. Generalized anxiety disorder  F41.1      Plan: Patient is to use CBT, mindfulness and coping skills to help manage decrease symptoms associated with their diagnosis.  Patient to follow through with getting more  exercise, adhering to a schedule.  Patient to continue to challenge some of her negative thoughts that contribute to her anxiety and feelings of depression.   Long-term goal:   Reduce overall level, frequency, and intensity of the feelings of depression and anxiety 7/10 to a 0-2/10 in severity for at least 3 consecutive months.   Short-term goal:  Decrease "deflating, self-doubting" and catastrophizing thinking style Decrease anxiety producing self talk such as thinking of the worse possible life outcomes Decrease panic episodes by utilizing coping skills discussed in session.    Assessment of progress:  progressing   Anson [place], [facility]

## 2020-04-15 ENCOUNTER — Ambulatory Visit (INDEPENDENT_AMBULATORY_CARE_PROVIDER_SITE_OTHER): Payer: BC Managed Care – PPO | Admitting: Physician Assistant

## 2020-04-15 ENCOUNTER — Encounter: Payer: Self-pay | Admitting: Physician Assistant

## 2020-04-15 DIAGNOSIS — F411 Generalized anxiety disorder: Secondary | ICD-10-CM

## 2020-04-15 DIAGNOSIS — F22 Delusional disorders: Secondary | ICD-10-CM

## 2020-04-15 DIAGNOSIS — G4700 Insomnia, unspecified: Secondary | ICD-10-CM

## 2020-04-15 DIAGNOSIS — F900 Attention-deficit hyperactivity disorder, predominantly inattentive type: Secondary | ICD-10-CM

## 2020-04-15 DIAGNOSIS — F319 Bipolar disorder, unspecified: Secondary | ICD-10-CM | POA: Diagnosis not present

## 2020-04-15 DIAGNOSIS — R440 Auditory hallucinations: Secondary | ICD-10-CM

## 2020-04-15 MED ORDER — BUSPIRONE HCL 30 MG PO TABS: 30.0000 mg | ORAL_TABLET | Freq: Two times a day (BID) | ORAL | 1 refills | Status: DC

## 2020-04-15 MED ORDER — CARIPRAZINE HCL 1.5 MG PO CAPS: 1.5000 mg | ORAL_CAPSULE | Freq: Every day | ORAL | 0 refills | Status: DC

## 2020-04-15 NOTE — Progress Notes (Signed)
[facility]  Patient ID: Paula Elliott,  MRN: [id number]  PCP: Terald Sleeper, PA-C  Date of Evaluation: 04/15/2020 Time spent:30 minutes  Chief Complaint:  Chief Complaint    Anxiety; Depression; Insomnia      HISTORY/CURRENT STATUS: HPI For routine med check.  Having a lot of anxiety still.  Has needed the Xanax often and even takes 2 pills at a time to get relief. States she gets paranoid, if her parents are gone and if she leaves the house too, worries that something is going to break in the house. If she and her parents go out together, then she doesn't worry as much.  When she leaves the house alone, she checks the door 3times to make sure it's locked.  Also checks her curling iron 3 times.   Is having AH for several months. Is a night owl and will sometimes hear 'circus music or like jazz music' but not every night, maybe once a week or every other week. Also not long ago, was at the grocery store and heard footsteps coming, but she turned around and looked, no one was there. Has heard voices in the past but only once and doesn't remember what they said.  States she has been nervous to report this to me or her counselor, [name].  Cut her left arm in the past month.  "It always runs through my head to do it. On a daily basis.  I've learned how to not act on it most of the time." Those thoughts get worse when she's more anxious.   Doesn't have increased energy but doesn't stay up different than normal.  Is spending more money than normal. 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  She is not working yet.  No grandiosity.   Denies dizziness, syncope, seizures, numbness, tingling, tremor, tics, unsteady gait, slurred speech, confusion. Denies muscle or joint pain, stiffness, or dystonia.Denies unexplained weight loss, frequent infections, or sores that heal slowly.  No polyphagia, polydipsia, or  polyuria. Denies visual changes or paresthesias.   Individual Medical History/ Review of Systems: Changes? :No    Past medications for mental health diagnoses include: Wellbutrin, Xanax, Ativan, Provigil, Latuda, Lamictal, Abilify, Prozac, Luvox, Valium, Seroquel.   Allergies: Patient has no known allergies.  Current Medications:  Current Outpatient Medications:  .  ALPRAZolam (XANAX) 1 MG tablet, Take 0.5-1 tablets (0.5-1 mg total) by mouth 3 (three) times daily as needed for anxiety., Disp: 90 tablet, Rfl: 2 .  atomoxetine (STRATTERA) 80 MG capsule, TAKE 1 CAPSULE BY MOUTH EVERY DAY, Disp: 30 capsule, Rfl: 1 .  gabapentin (NEURONTIN) 800 MG tablet, TAKE 1 TABLET BY MOUTH TWICE DAILY, Disp: 60 tablet, Rfl: 1 .  metFORMIN (GLUCOPHAGE) 1000 MG tablet, TAKE 1 TABLET BY MOUTH DAILY WITH FOOD, Disp: 30 tablet, Rfl: 2 .  QUEtiapine (SEROQUEL) 50 MG tablet, TAKE 1 TABLET BY MOUTH AT BEDTIME, Disp: 30 tablet, Rfl: 1 .  busPIRone (BUSPAR) 30 MG tablet, Take 1 tablet (30 mg total) by mouth 2 (two) times daily., Disp: 60 tablet, Rfl: 1 .  cariprazine (VRAYLAR) capsule, Take 1 capsule (1.5 mg total) by mouth daily., Disp: 30 capsule, Rfl: 0 .  cyclobenzaprine (FLEXERIL) 10 MG tablet, TAKE 1 TABLET BY MOUTH THREE TIMES DAILY AS NEEDED FOR MUSCLE SPASMS (Patient not taking: Reported on 12/27/2019), Disp: 30 tablet, Rfl: 1 .  DULoxetine (CYMBALTA) 60 MG capsule, TAKE TWO CAPSULES BY  MOUTH EVERY DAY, Disp: 60 capsule, Rfl: 2 .  lithium carbonate 150 MG capsule, TAKE ONE CAPSULE BY MOUTH AT BEDTIME, Disp: 30 capsule, Rfl: 2 .  zolpidem (AMBIEN CR) 12.5 MG CR tablet, TAKE 1 TABLET BY MOUTH AT BEDTIME AS NEEDED FOR SLEEP, Disp: 30 tablet, Rfl: 2 Medication Side Effects: none  Family Medical/ Social History: Changes? No  MENTAL HEALTH EXAM:  There were no vitals taken for this visit.There is no height or weight on file to calculate BMI.  General Appearance: Casual, Neat, Well Groomed and Obese  Eye  Contact:  Good  Speech:  Clear and Coherent and Normal Rate  Volume:  Normal  Mood:  Anxious and Depressed  Affect:  Depressed and Anxious  Thought Process:  Goal Directed and Descriptions of Associations: Intact  Orientation:  Full (Time, Place, and Person)  Thought Content: Logical   Suicidal Thoughts:  No  Homicidal Thoughts:  No  Memory:  WNL  Judgement:  Good  Insight:  Good  Psychomotor Activity:  Normal  Concentration:  Concentration: Good  Recall:  Good  Fund of Knowledge: Good  Language: Good  Assets:  Desire for Improvement  ADL's:  Intact  Cognition: WNL  Prognosis:  Good    DIAGNOSES:    ICD-10-CM   1. Generalized anxiety disorder  F41.1   2. Bipolar I disorder ([place])  F31.9   3. Insomnia, unspecified type  G47.00   4. Attention deficit hyperactivity disorder (ADHD), predominantly inattentive type  F90.0   5. Auditory hallucinations  R44.0   6. Paranoia [facility])  [place]     Receiving Psychotherapy: Yes With [name], [facility] C   RECOMMENDATIONS:  PDMP reviewed. I spent 30 minutes with her. Do not overtake the Xanax.  She understands that she will run out early if she takes more than prescribed and I will not fill early.  I explained the tolerance issue and that the more she takes, the more she will need and then that we will eventually be ineffective so we need to find other ways to treat the anxiety.  I recommend increasing the BuSpar because it has helped just a little bit. I also recommend adding an antipsychotic for the hallucinations and paranoia.  We discussed the benefits, risks, side effects and she understands and accepts.  She has already tried several so recommend either Vraylar or [place].  The Rexulti might cause more anxiety so I am choosing Vraylar. I tried to reassure her that she can talk with Gerald Stabs or I about anything.  We both want to help her and if we do not know all of her symptoms and what she is going through, then we're unable to help as well  as we might would if we knew the whole story.  She understands. Continue Xanax 1 mg, 1 p.o. 3 times daily as needed. Continue Strattera 80 mg, 1 p.o. every morning. Increase BuSpar to 30 mg p.o. twice daily. Start Vraylar 1.5 mg p.o. daily.  She was given 2 weeks worth of samples and a co-pay card. Continue Cymbalta 60 mg, 2 p.o. daily. Continue gabapentin 800 mg, 1 p.o. twice daily. Continue lithium 150 mg, 1 p.o. nightly. Continue metformin 1 g daily. Continue Seroquel 50 mg, 1 p.o. nightly for sleep. Continue Ambien CR 12.5 mg, 1 nightly as needed sleep. Continue vitamins and supplements. Continue therapy with [name], [facility] C. Return in 3 to 4 weeks.   Donnal Moat, PA-C

## 2020-04-18 ENCOUNTER — Telehealth: Payer: Self-pay

## 2020-04-18 NOTE — Telephone Encounter (Signed)
Prior authorization submitted and approved for VRAYLAR 1.5 MG capsule effective 04/17/2020-04/16/2023 with [employer] of [place].     Submitted through cover my meds

## 2020-04-24 ENCOUNTER — Other Ambulatory Visit: Payer: Self-pay | Admitting: Physician Assistant

## 2020-04-25 NOTE — Telephone Encounter (Signed)
Her next apt is 06/16

## 2020-04-30 ENCOUNTER — Ambulatory Visit: Payer: BC Managed Care – PPO | Admitting: Mental Health

## 2020-05-07 ENCOUNTER — Other Ambulatory Visit: Payer: Self-pay | Admitting: Physician Assistant

## 2020-05-13 ENCOUNTER — Telehealth: Payer: Self-pay | Admitting: Mental Health

## 2020-05-13 NOTE — Telephone Encounter (Signed)
Pt asks for a call back. She had to cancel upcoming appt with you because she got a new job. Wants to discuss timing of when to return.

## 2020-05-14 ENCOUNTER — Ambulatory Visit: Payer: BC Managed Care – PPO | Admitting: Physician Assistant

## 2020-05-15 ENCOUNTER — Ambulatory Visit: Payer: BC Managed Care – PPO | Admitting: Mental Health

## 2020-05-26 ENCOUNTER — Other Ambulatory Visit: Payer: Self-pay

## 2020-05-26 ENCOUNTER — Ambulatory Visit: Payer: BC Managed Care – PPO | Admitting: Mental Health

## 2020-05-26 DIAGNOSIS — F411 Generalized anxiety disorder: Secondary | ICD-10-CM | POA: Diagnosis not present

## 2020-05-26 DIAGNOSIS — F319 Bipolar disorder, unspecified: Secondary | ICD-10-CM

## 2020-05-26 DIAGNOSIS — F900 Attention-deficit hyperactivity disorder, predominantly inattentive type: Secondary | ICD-10-CM | POA: Diagnosis not present

## 2020-05-26 NOTE — Progress Notes (Signed)
[facility] Counselor/Therapist Progress Note   Patient ID: Paula Elliott,  MRN: [id number] Date:  05/26/20  Timespent: 53 minutes    Treatment Type: Individual therapy  Mental Status Exam:    Appearance:   Casual, appropriate  Behavior:  WNL  Motor:  WNL  Speech/Language:   Clear and Coherent  Affect:  Full range  Mood:  Anxious, depressed  Thought process:  normal  Thought content:   WNL, denies SI/HI  Sensory/Perceptual disturbances:   none  Orientation:  oriented to person, place and time/date  Attention:  Good  Concentration:  Good  Memory:  WNL  Fund of knowledge:   Good  Insight:   developing  Judgment:   developing  Impulse Control:  developing   Reported Symptoms: Depressed mood most days, anxiety, rumination, hx of mood swings, crying spells    Risk Assessment: Danger to Self: No Self-injurious Behavior:  Positive history, none in the past few weeks Danger to Others: No Duty to Warn:no Physical Aggression / Violence:No  Access to Firearms a concern: No  Gang Involvement:No  Patient / guardian was educated about steps to take if suicide or homicide risk level increases between visits. While future psychiatric events cannot be accurately predicted, the patient does not currently require acute inpatient psychiatric care and does not currently meet [facility] involuntary commitment criteria.  Subjective:   Patient presents for session, sharing progress.  She shared positive changes, she found full-time employment and bought a new vehicle.  She went on to share many details related to that her new work experiences as she obtained a job about 2 weeks ago.  She stated that she is training currently and is being told that she is doing very well thus far at her job by her coworkers.  This is relieved some of the family stress as her parents have been encouraging her to find a job for quite some time although she was searching  diligently.  She shared how she has some anxiety about her performance on the job, worries if she will be able to be successful with it long-term, tends to ruminate at times about this, admits she needs validation although she has difficulty accepting the validation she receives verbally from coworkers.  Struggles to maintain focus and concentration at times, is often busy writing down information as to not forget details at work, this is been a struggle for many years prior to this job throughout school.  Assisted her in identifying and verbalizing her apparent ability to adjust and thrive at work.  Provided her homework assignment centered around self strength identification and tracking her panic attacks with more detail.  She stated she has had 1 panic attack over the past 2 weeks that these have generally decreased recently.    Interventions: Cognitive Behavioral Therapy, supportive therapy, strength based approaches  Diagnosis:   ICD-10-CM   1. Generalized anxiety disorder  F41.1   2. Bipolar I disorder ([place])  F31.9   3. Attention deficit hyperactivity disorder (ADHD), predominantly inattentive type  F90.0      Plan: Patient is to use CBT, mindfulness and coping skills to help manage decrease symptoms associated with their diagnosis. Patient to follow through with homework assignments given.   Long-term goal:   Reduce overall level, frequency, and intensity of the feelings of depression and anxiety 7/10 to a 0-2/10 in severity for at least 3 consecutive months.   Short-term goal:  Decrease "deflating, self-doubting" and catastrophizing thinking style  Decrease anxiety producing self talk such as thinking of the worse possible life outcomes Decrease panic episodes by utilizing coping skills discussed in session.  Patient to utilize coping as discussed in session Patient to take medications as prescribed and report any concerns to her prescribing provider   Assessment of progress:   progressing   Anson [place], [facility]

## 2020-05-27 ENCOUNTER — Ambulatory Visit (INDEPENDENT_AMBULATORY_CARE_PROVIDER_SITE_OTHER): Payer: BC Managed Care – PPO | Admitting: Physician Assistant

## 2020-05-27 ENCOUNTER — Encounter: Payer: Self-pay | Admitting: Physician Assistant

## 2020-05-27 VITALS — BP 143/100 | HR 115

## 2020-05-27 DIAGNOSIS — F319 Bipolar disorder, unspecified: Secondary | ICD-10-CM | POA: Diagnosis not present

## 2020-05-27 DIAGNOSIS — F5105 Insomnia due to other mental disorder: Secondary | ICD-10-CM | POA: Diagnosis not present

## 2020-05-27 DIAGNOSIS — F902 Attention-deficit hyperactivity disorder, combined type: Secondary | ICD-10-CM

## 2020-05-27 DIAGNOSIS — F411 Generalized anxiety disorder: Secondary | ICD-10-CM | POA: Diagnosis not present

## 2020-05-27 DIAGNOSIS — F99 Mental disorder, not otherwise specified: Secondary | ICD-10-CM

## 2020-05-27 DIAGNOSIS — Z79899 Other long term (current) drug therapy: Secondary | ICD-10-CM

## 2020-05-27 MED ORDER — CLONIDINE HCL ER 0.1 MG PO TB12: ORAL_TABLET | ORAL | 1 refills | Status: DC

## 2020-05-27 MED ORDER — CARIPRAZINE HCL 1.5 MG PO CAPS: 1.5000 mg | ORAL_CAPSULE | Freq: Every day | ORAL | 0 refills | Status: DC

## 2020-05-27 NOTE — Progress Notes (Signed)
[facility]  Patient ID: Paula Elliott,  MRN: [id number]  PCP: Terald Sleeper, PA-C  Date of Evaluation: 05/27/2020 Time spent:40 minutes  Chief Complaint:  Chief Complaint    ADD; Anxiety; Insomnia      HISTORY/CURRENT STATUS: HPI having trouble concentrating.  Paula Elliott presents as a work in for worsening problems of concentrating.  She has just started a new job where it is even more noticeable than it has been in the past.  The Strattera is not helping at all.  She feels like the anxiety and the inability to focus go hand-in-hand.  The anxiety prevents the focus and then having difficulty focusing and staying on task makes her anxious.  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  The anxiety is about the same really.  She has been trying to decrease the frequency of the Xanax.  She is taking it at least twice a day, not 3 times a day routinely as she had been.  It is still effective.  She only drinks 1 cup of coffee in the morning and 1 glass of tea later in the day and the rest of the time she drinks water.  This has not changed.  She has had several good things happen recently.  She got this new job which she really likes.  She has only been there 2 weeks but likes it a lot.  She also got a new car.  Feels that the depression is a lot better.  "Of course I feel better because there are good things going on, it is hard to be depressed."  Overall though she is responding to the medications.  She is able to enjoy things, energy and motivation are good.  Not crying easily.  She does not sleep at all without the Ambien, she has tried to go without it and was not successful.  She has tried on several different occasions.  No suicidal or homicidal thoughts.  Patient denies increased energy with decreased need for sleep, no increased talkativeness, no racing thoughts, no impulsivity or risky behaviors, no increased spending,  no increased libido, no grandiosity, no increased irritability or anger, and no hallucinations, since starting the Vraylar, last month.  Denies dizziness, syncope, seizures, numbness, tingling, tremor, tics, unsteady gait, slurred speech, confusion. Denies muscle or joint pain, stiffness, or dystonia.Denies unexplained weight loss, frequent infections, or sores that heal slowly.  No polyphagia, polydipsia, or polyuria. Denies visual changes or paresthesias.   Individual Medical History/ Review of Systems: Changes? :No    Past medications for mental health diagnoses include: Wellbutrin, Xanax, Ativan, Provigil, Latuda, Lamictal, Abilify, Prozac, Luvox, Valium, Seroquel.   Allergies: Patient has no known allergies.  Current Medications:  Current Outpatient Medications:  .  ALPRAZolam (XANAX) 1 MG tablet, TAKE 1/2 TO 1 TABLET BY MOUTH THREE TIMES DAILY AS NEEDED FOR ANXIETY, Disp: 90 tablet, Rfl: 2 .  atomoxetine (STRATTERA) 80 MG capsule, TAKE 1 CAPSULE BY MOUTH EVERY DAY, Disp: 30 capsule, Rfl: 1 .  busPIRone (BUSPAR) 30 MG tablet, Take 1 tablet (30 mg total) by mouth 2 (two) times daily., Disp: 60 tablet, Rfl: 1 .  cariprazine (VRAYLAR) capsule, Take 1 capsule (1.5 mg total) by mouth daily., Disp: 30 capsule, Rfl: 0 .  DULoxetine (CYMBALTA) 60 MG capsule, TAKE TWO CAPSULES BY MOUTH EVERY DAY, Disp: 60 capsule, Rfl: 2 .  gabapentin (NEURONTIN) 800 MG tablet, TAKE 1 TABLET BY MOUTH TWICE DAILY,  Disp: 60 tablet, Rfl: 1 .  lithium carbonate 150 MG capsule, TAKE ONE CAPSULE BY MOUTH AT BEDTIME, Disp: 30 capsule, Rfl: 2 .  metFORMIN (GLUCOPHAGE) 1000 MG tablet, TAKE 1 TABLET BY MOUTH DAILY WITH FOOD, Disp: 30 tablet, Rfl: 2 .  QUEtiapine (SEROQUEL) 50 MG tablet, TAKE 1 TABLET BY MOUTH AT BEDTIME, Disp: 30 tablet, Rfl: 1 .  zolpidem (AMBIEN CR) 12.5 MG CR tablet, TAKE 1 TABLET BY MOUTH AT BEDTIME AS NEEDED FOR SLEEP, Disp: 30 tablet, Rfl: 2 .  cloNIDine HCl (KAPVAY) 0.1 MG TB12 ER tablet, 1 po qhs  for 2 nights, then increase to 1 po bid., Disp: 60 tablet, Rfl: 1 .  cyclobenzaprine (FLEXERIL) 10 MG tablet, TAKE 1 TABLET BY MOUTH THREE TIMES DAILY AS NEEDED FOR MUSCLE SPASMS (Patient not taking: Reported on 12/27/2019), Disp: 30 tablet, Rfl: 1 Medication Side Effects: none  Family Medical/ Social History: Changes? No  MENTAL HEALTH EXAM:  Blood pressure (!) 143/100, pulse (!) 115.There is no height or weight on file to calculate BMI.  She was rushing to get to the office.  Her pulse is always elevated, according to the patient.  Her PCP is aware of this.    General Appearance: Casual, Neat, Well Groomed and Obese  Eye Contact:  Good  Speech:  Clear and Coherent and Normal Rate  Volume:  Normal  Mood:  Anxious  Affect:  Anxious and she was rushing to get here for the work in appointment.  Thought Process:  Goal Directed and Descriptions of Associations: Intact  Orientation:  Full (Time, Place, and Person)  Thought Content: Logical   Suicidal Thoughts:  No  Homicidal Thoughts:  No  Memory:  WNL  Judgement:  Good  Insight:  Good  Psychomotor Activity:  Normal  Concentration:  Concentration: Fair and Attention Span: Fair  Recall:  Good  Fund of Knowledge: Good  Language: Good  Assets:  Desire for Improvement  ADL's:  Intact  Cognition: WNL  Prognosis:  Good    DIAGNOSES:    ICD-10-CM   1. Attention deficit hyperactivity disorder (ADHD), combined type  F90.2   2. Bipolar I disorder ([place])  F31.9   3. Generalized anxiety disorder  F41.1   4. Insomnia due to other mental disorder  F51.05    F99   5. Polypharmacy  Z79.899     Receiving Psychotherapy: Yes With [name], [facility] C   RECOMMENDATIONS:  PDMP reviewed. I provided 40 minutes of face-to-face time during this encounter. We discussed the diagnosis and treatment options.  Because she is on Xanax daily, I am unable to give a stimulant at this time.  I recommend either Kapvay or Intuniv, I specifically recommend  that They because it can also help with anxiety.  Benefits, risks, side effects were discussed and she accepts.  She understands that this is a blood pressure medication and if she gets dizzy or lightheaded, she should let me know.  Orthostatic hypotension precautions given.  Initially she should take it at bedtime immediately before crawling into bed.  For now, we will continue Strattera.  I do not want to change too many things at once. One goal will be to consolidate some of her medications so she is not on so many different things.  We will talk about that further at the next visit, today we are only focusing on the ADHD. Start Kapvay 0.1 mg, 1 p.o. nightly for 2 nights, then 1 p.o. twice daily. Continue Xanax 1 mg,  1 p.o. 3 times daily as needed. Continue Strattera 80 mg, 1 p.o. every morning. Continue BuSpar 30 mg p.o. twice daily. Continue  Vraylar 1.5 mg p.o. daily.  Continue Cymbalta 60 mg, 2 p.o. daily. Continue gabapentin 800 mg, 1 p.o. twice daily. Continue lithium 150 mg, 1 p.o. nightly. Continue metformin 1 g daily. Continue Seroquel 50 mg, 1 p.o. nightly for sleep. Continue Ambien CR 12.5 mg, 1 nightly as needed sleep. Continue vitamins and supplements. Continue therapy with [name], [facility] C. Return in 4 weeks.  Donnal Moat, PA-C

## 2020-05-29 ENCOUNTER — Telehealth: Payer: Self-pay | Admitting: Physician Assistant

## 2020-05-29 ENCOUNTER — Ambulatory Visit: Payer: BC Managed Care – PPO | Admitting: Mental Health

## 2020-05-29 ENCOUNTER — Other Ambulatory Visit: Payer: Self-pay

## 2020-05-29 MED ORDER — CLONIDINE HCL ER 0.1 MG PO TB12: ORAL_TABLET | ORAL | 1 refills | Status: DC

## 2020-05-29 NOTE — Telephone Encounter (Signed)
They probably have it but I'm sure it will need a PA. I will resubmit also

## 2020-05-29 NOTE — Telephone Encounter (Signed)
Paula Elliott called about her Kapvay.  She was at the pharmacy, [facility] but they said they didn't have the prescription.  Chart shows it confirmed and they told her they didn't have it.  Please resend.

## 2020-06-03 ENCOUNTER — Other Ambulatory Visit: Payer: BC Managed Care – PPO | Admitting: Adult Health

## 2020-06-05 ENCOUNTER — Other Ambulatory Visit: Payer: Self-pay | Admitting: Physician Assistant

## 2020-06-24 ENCOUNTER — Ambulatory Visit: Payer: BC Managed Care – PPO | Admitting: Physician Assistant

## 2020-07-02 ENCOUNTER — Other Ambulatory Visit: Payer: Self-pay

## 2020-07-02 ENCOUNTER — Ambulatory Visit (INDEPENDENT_AMBULATORY_CARE_PROVIDER_SITE_OTHER): Payer: BC Managed Care – PPO | Admitting: Mental Health

## 2020-07-02 DIAGNOSIS — F319 Bipolar disorder, unspecified: Secondary | ICD-10-CM | POA: Diagnosis not present

## 2020-07-02 NOTE — Progress Notes (Signed)
      [facility] Counselor/Therapist Progress Note   Patient ID: Karime Scheuermann,  MRN: [id number] Date:  07/02/20  Timespent: 53 minutes    Treatment Type: Individual therapy  Mental Status Exam:    Appearance:   Casual, appropriate  Behavior:  WNL  Motor:  WNL  Speech/Language:   Clear and Coherent  Affect:  Full range  Mood:  Anxious, depressed  Thought process:  normal  Thought content:   WNL, denies SI/HI  Sensory/Perceptual disturbances:   none  Orientation:  oriented to person, place and time/date  Attention:  Good  Concentration:  Good  Memory:  WNL  Fund of knowledge:   Good  Insight:   developing  Judgment:   developing  Impulse Control:  developing   Reported Symptoms: Depressed mood most days, anxiety, rumination, hx of mood swings, crying spells    Risk Assessment: Danger to Self: No Self-injurious Behavior:  Positive history, none in the past few weeks Danger to Others: No Duty to Warn:no Physical Aggression / Violence:No  Access to Firearms a concern: No  Gang Involvement:No  Patient / guardian was educated about steps to take if suicide or homicide risk level increases between visits. While future psychiatric events cannot be accurately predicted, the patient does not currently require acute inpatient psychiatric care and does not currently meet [facility] involuntary commitment criteria.  Subjective:   Patient presents for session in some apparent distress.  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  She shared how she has been diligent, trying to be effective at work giving various examples.  Facilitated discussion with patient, challenging her narrative which is self doubting.  She tends to catastrophize her future particularly when she is taking steps toward achieving set goals.  She identified how she can be  self-critical and self doubting also able to acknowledge the progress she has made recently during discussion.  Encouraged her to take the time daily to remind herself of positive steps and personal strengths particularly through her journaling.    Interventions: Cognitive Behavioral Therapy, supportive therapy, strength based approaches  Diagnosis:   ICD-10-CM   1. Bipolar I disorder ([place])  F31.9      Plan: Patient is to use CBT, mindfulness and coping skills to help manage decrease symptoms associated with their diagnosis. Patient to follow through with homework assignments given.   Long-term goal:   Reduce overall level, frequency, and intensity of the feelings of depression and anxiety 7/10 to a 0-2/10 in severity for at least 3 consecutive months.   Short-term goal:  Decrease "deflating, self-doubting" and catastrophizing thinking style Decrease anxiety producing self talk such as thinking of the worse possible life outcomes Decrease panic episodes by utilizing coping skills discussed in session.  Patient to utilize coping as discussed in session Patient to take medications as prescribed and report any concerns to her prescribing provider   Assessment of progress:  progressing   Anson [place], [facility]

## 2020-07-03 ENCOUNTER — Ambulatory Visit (INDEPENDENT_AMBULATORY_CARE_PROVIDER_SITE_OTHER): Payer: BC Managed Care – PPO | Admitting: Adult Health

## 2020-07-03 ENCOUNTER — Encounter: Payer: Self-pay | Admitting: Adult Health

## 2020-07-03 ENCOUNTER — Other Ambulatory Visit (HOSPITAL_COMMUNITY)
Admission: RE | Admit: 2020-07-03 | Discharge: 2020-07-03 | Disposition: A | Discharge status: Home / Self Care | Payer: BC Managed Care – PPO | Source: Ambulatory Visit | Attending: Adult Health | Admitting: Adult Health

## 2020-07-03 VITALS — BP 107/73 | HR 86 | Ht 64.0 in | Wt 196.0 lb

## 2020-07-03 DIAGNOSIS — N921 Excessive and frequent menstruation with irregular cycle: Secondary | ICD-10-CM

## 2020-07-03 DIAGNOSIS — Z3202 Encounter for pregnancy test, result negative: Secondary | ICD-10-CM | POA: Insufficient documentation

## 2020-07-03 DIAGNOSIS — Z01419 Encounter for gynecological examination (general) (routine) without abnormal findings: Secondary | ICD-10-CM | POA: Insufficient documentation

## 2020-07-03 DIAGNOSIS — N946 Dysmenorrhea, unspecified: Secondary | ICD-10-CM | POA: Insufficient documentation

## 2020-07-03 LAB — POCT URINE PREGNANCY: Preg Test, Ur: NEGATIVE

## 2020-07-03 MED ORDER — NORETHIN ACE-ETH ESTRAD-FE 1-20 MG-MCG PO TABS
1.0000 | ORAL_TABLET | Freq: Every day | ORAL | 11 refills | Status: DC
Start: 2020-07-03 — End: 2021-01-14

## 2020-07-03 NOTE — Progress Notes (Addendum)
Patient ID: Paula Elliott, female   DOB: Jun 12, 1991, 29 y.o.   MRN: [id number] History of Present Illness: Paula Elliott is a 29 year old white female, single G0P0, in for well woman gyn exam and firt pap. She says periods are heavy 5/7 days and changes tampons every 4-5 hours, has cramps and last month had 3 periods and wants to get on OCs, used in her teens Has acne, too. Has clear vaginal discharge at times.  She sees Donnal Moat PA for her depression. PCP is Paula Elliott.   Current Medications, Allergies, Past Medical History, Past Surgical History, Family History and Social History were reviewed in [employer] record.     Review of Systems: Patient denies any headaches, hearing loss, fatigue, blurred vision, shortness of breath, chest pain, abdominal pain, problems with bowel movements, urination, or intercourse(never had sex). No joint pain. See HPI for positives.    Physical Exam:BP 107/73 (BP Location: Left Arm, Patient Position: Sitting, Cuff Size: Normal)   Pulse 86   Ht 5\' 4"  (1.626 m)   Wt 196 lb (88.9 kg)   LMP 06/19/2020 (Exact Date)   BMI 33.64 kg/m UPT is negative. General:  Well developed, well nourished, no acute distress Skin:  Warm and dry,has numerous tattoos Neck:  Midline trachea, normal thyroid, good ROM, no lymphadenopathy Lungs; Clear to auscultation bilaterally Breast:  No dominant palpable mass, retraction, or nipple discharge,has breast asymmetry, R>L, has bilateral nipple rods.  Cardiovascular: Regular rate and rhythm,has +murmur.  Abdomen:  Soft, non tender, no hepatosplenomegaly,obese Pelvic:  External genitalia is normal in appearance, no lesions.  The vagina is normal in appearance. Urethra has no lesions or masses. The cervix is nulliparous, pap with GC/CHL and high risk HPV genotyping performed. Marland Kitchen  Uterus is felt to be normal size, shape, and contour.  No adnexal masses or tenderness noted.Bladder is non tender, no  masses felt. Extremities/musculoskeletal:  No swelling or varicosities noted, no clubbing or cyanosis Psych:  No mood changes, alert and cooperative,seems happy AA is 5 Fall risk is low PHQ 9 score is 6, no SI is on meds and sees psychiatrist   [facility] - 07/03/20 1555      Pregnancy Intention Screening   Does the patient want to become pregnant in the next year? No    Does the patient's partner want to become pregnant in the next year? N/A    Would the patient like to discuss contraceptive options today? Yes      Contraception Wrap Up   Current Method No Method - Other Reason    End Method Oral Contraceptive    Contraception Counseling Provided Yes          Examination chaperoned by Dwyane Dee LPN  Impression and Plan:  1. Encounter for gynecological examination with Papanicolaou smear of cervix Pap sent Physical in 1 year  Pap in 3 if normal Pull hair off face at night   2. Urine pregnancy test negative   3. Menorrhagia with irregular cycle Will rx junel 1/20 can start today, if has sex use condoms Meds ordered this encounter  Medications  . norethindrone-ethinyl estradiol (LOESTRIN FE) 1-20 MG-MCG tablet    Sig: Take 1 tablet by mouth daily.    Dispense:  28 tablet    Refill:  11    Order Specific Question:   Supervising Provider    Answer:   Florian Buff [[id number]]   Will get GYN [place] to assess uterus   4.  Dysmenorrhea Will get GYN [place]  Will try Junel 1/20

## 2020-07-04 DIAGNOSIS — R87610 Atypical squamous cells of undetermined significance on cytologic smear of cervix (ASC-US): Secondary | ICD-10-CM | POA: Diagnosis not present

## 2020-07-08 LAB — CYTOLOGY - PAP
Chlamydia: NEGATIVE
Comment: NEGATIVE
Comment: NEGATIVE
Comment: NORMAL
Diagnosis: UNDETERMINED — AB
High risk HPV: NEGATIVE
Neisseria Gonorrhea: NEGATIVE

## 2020-07-09 ENCOUNTER — Other Ambulatory Visit: Payer: Self-pay | Admitting: Physician Assistant

## 2020-07-09 ENCOUNTER — Encounter: Payer: Self-pay | Admitting: Adult Health

## 2020-07-09 DIAGNOSIS — R87610 Atypical squamous cells of undetermined significance on cytologic smear of cervix (ASC-US): Secondary | ICD-10-CM | POA: Insufficient documentation

## 2020-07-10 NOTE — Telephone Encounter (Signed)
Last apt 05/27/20, due back 4 weeks  Not scheduled

## 2020-07-22 ENCOUNTER — Ambulatory Visit (INDEPENDENT_AMBULATORY_CARE_PROVIDER_SITE_OTHER): Payer: BC Managed Care – PPO | Admitting: Mental Health

## 2020-07-22 DIAGNOSIS — F411 Generalized anxiety disorder: Secondary | ICD-10-CM | POA: Diagnosis not present

## 2020-07-22 DIAGNOSIS — F3181 Bipolar II disorder: Secondary | ICD-10-CM

## 2020-07-22 NOTE — Progress Notes (Signed)
[facility] Counselor/Paula Progress Note   Patient ID: Paula Elliott,  MRN: [id number] Date:  07/22/20  Timespent: 55 minutes    Treatment Type: Individual therapy  Virtual Visit via Telephone Note Connected with patient by telephone, with their informed consent, and verified patient privacy and that I am speaking with the correct person using two identifiers. I discussed the limitations, risks, security and privacy concerns of performing psychotherapy and management service by telephone and the availability of in person appointments. I also discussed with the patient that there may be a patient responsible charge related to this service. The patient expressed understanding and agreed to proceed. I discussed the treatment planning with the patient. The patient was provided an opportunity to ask questions and all were answered. The patient agreed with the plan and demonstrated an understanding of the instructions. The patient was advised to call  our office if  symptoms worsen or feel they are in a crisis state and need immediate contact.   Paula Elliott: office Patient Elliott: home    Mental Status Exam:    Appearance:   Casual, appropriate  Behavior:  WNL  Motor:  WNL  Speech/Language:   Clear and Coherent  Affect:  Full range  Mood:  Anxious, pleasant  Thought process:  normal  Thought content:   WNL, denies SI/HI  Sensory/Perceptual disturbances:   none  Orientation:  oriented to person, place and time/date  Attention:  Good  Concentration:  Good  Memory:  WNL  Fund of knowledge:   Good  Insight:   developing  Judgment:   good  Impulse Control:  good   Reported Symptoms: Depressed mood most days, anxiety, rumination, hx of mood swings, crying spells    Risk Assessment: Danger to Self: No Self-injurious Behavior:  Positive history, none in the past few months Danger to Others: No Duty to Warn:no Physical Aggression /  Violence:No  Access to Firearms a concern: No  Gang Involvement:No  Patient / guardian was educated about steps to take if suicide or homicide risk level increases between visits. While future psychiatric events cannot be accurately predicted, the patient does not currently require acute inpatient psychiatric care and does not currently meet [facility] involuntary commitment criteria.  Subjective:   Patient engages in telehealth session via telephone. She stated she continues to doubt herself due to feeling sheltered by her parents.  She stated she is approaching the completion of her 90-day probationary period at work.  She stated she is doing well at work, thriving however, she finds herself questioning her ability "Am I good enough". This also is loaded into her friendships; she has tried suppress this in her friendships to not appear "needy" She feels her confidence has improved as a person, but feels that eventually her progress toward being more independent, keeping and holding onto a job will eventually dissolve.  She stated this has occurred in the past.  We discussed ways to look for "warning signs" and she feels her functioning is beginning to deteriorate.  We discussed WRAP and provided resource for her to utilize to begin the process of identifying specific ways toward maintaining health and wellness.  Assisted her throughout the session in challenging various negative cognitions shared.    Interventions: Cognitive Behavioral Therapy, supportive therapy, strength based approaches  Diagnosis:   ICD-10-CM   1. Bipolar II disorder ([place])  F31.81   2. Generalized anxiety disorder  F41.1      Plan: Patient is to  use CBT, mindfulness and coping skills to help manage decrease symptoms associated with their diagnosis. Patient to follow through with homework assignments given.   Long-term goal:   Reduce overall level, frequency, and intensity of the feelings of depression and anxiety 7/10  to a 0-2/10 in severity for at least 3 consecutive months.   Short-term goal:  Decrease "deflating, self-doubting" and catastrophizing thinking style Decrease anxiety producing self talk such as thinking of the worse possible life outcomes Decrease panic episodes by utilizing coping skills discussed in session.  Patient to utilize coping as discussed in session Patient to take medications as prescribed and report any concerns to her prescribing provider   Assessment of progress:  progressing   Anson [place], [facility]

## 2020-08-11 ENCOUNTER — Other Ambulatory Visit: Payer: Self-pay | Admitting: Physician Assistant

## 2020-08-11 NOTE — Telephone Encounter (Signed)
Review.

## 2020-08-12 ENCOUNTER — Ambulatory Visit (INDEPENDENT_AMBULATORY_CARE_PROVIDER_SITE_OTHER): Payer: BC Managed Care – PPO

## 2020-08-12 ENCOUNTER — Other Ambulatory Visit: Payer: Self-pay

## 2020-08-12 ENCOUNTER — Ambulatory Visit: Payer: BC Managed Care – PPO | Admitting: Mental Health

## 2020-08-12 ENCOUNTER — Ambulatory Visit (HOSPITAL_COMMUNITY)
Admission: RE | Admit: 2020-08-12 | Discharge: 2020-08-12 | Disposition: A | Discharge status: Home / Self Care | Payer: BC Managed Care – PPO | Source: Ambulatory Visit | Attending: Family Medicine | Admitting: Family Medicine

## 2020-08-12 ENCOUNTER — Ambulatory Visit (INDEPENDENT_AMBULATORY_CARE_PROVIDER_SITE_OTHER): Payer: BC Managed Care – PPO | Admitting: Family Medicine

## 2020-08-12 ENCOUNTER — Telehealth: Payer: Self-pay | Admitting: Family Medicine

## 2020-08-12 ENCOUNTER — Encounter: Payer: Self-pay | Admitting: Family Medicine

## 2020-08-12 VITALS — BP 128/91 | HR 80 | Temp 97.7°F | Ht 64.0 in | Wt 193.4 lb

## 2020-08-12 DIAGNOSIS — R1032 Left lower quadrant pain: Secondary | ICD-10-CM

## 2020-08-12 DIAGNOSIS — K7689 Other specified diseases of liver: Secondary | ICD-10-CM | POA: Diagnosis not present

## 2020-08-12 DIAGNOSIS — M4319 Spondylolisthesis, multiple sites in spine: Secondary | ICD-10-CM | POA: Diagnosis not present

## 2020-08-12 DIAGNOSIS — L299 Pruritus, unspecified: Secondary | ICD-10-CM | POA: Diagnosis not present

## 2020-08-12 DIAGNOSIS — R109 Unspecified abdominal pain: Secondary | ICD-10-CM | POA: Diagnosis not present

## 2020-08-12 MED ORDER — IOHEXOL 9 MG/ML PO SOLN
ORAL | Status: AC
  Filled 2020-08-12: qty 1000

## 2020-08-12 MED ORDER — IOHEXOL 300 MG/ML  SOLN
100.0000 mL | Freq: Once | INTRAMUSCULAR | Status: AC | PRN
  Administered 2020-08-12: 100 mL via INTRAVENOUS

## 2020-08-12 NOTE — Patient Instructions (Signed)
Zyrtec or Xyzal at night for itching (buy generic).

## 2020-08-12 NOTE — Progress Notes (Signed)
Assessment & Plan:  1. LLQ abdominal pain - DG Abd 1 View - negative - CT Abdomen Pelvis W Contrast; Future  2. Pruritus - Labs to assess for potential cause.  Advise she may take Xyzal or Zyrtec once daily to help with itching.  Discussed that even though she has not changed products does not mean the manufacturer of her products did not change their ingredients.  Encouraged her to change one product at a time to see if it would resolve her itching. - CBC with Differential/Platelet - CMP14+EGFR - TSH   Return if symptoms worsen or fail to improve.  Hendricks Limes, MSN, APRN, FNP-C [facility]  Subjective:    Patient ID: Paula Elliott, female    DOB: Aug 09, 1991, 29 y.o.   MRN: [id number]  Patient Care Team: Loman Brooklyn, FNP as PCP - General (Family Medicine) Herminio Commons, MD (Inactive) as PCP - Cardiology (Cardiology) Theodoro Clock (Physician Assistant)   Chief Complaint:  Chief Complaint  Patient presents with  . Abdominal Pain    Patient states that she has been having lower abd pain that has been going on for 1.5 weeks.  . Pruritis    Patient states that she has been itching all over her body x 5 days.    HPI: [name] is a 29 y.o. female presenting on 08/12/2020 for Abdominal Pain (Patient states that she has been having lower abd pain that has been going on for 1.5 weeks.) and Pruritis (Patient states that she has been itching all over her body x 5 days.)  Patient reports left lower abdominal pain that has been going on for the past week and a half and is getting worse.  She describes the pain as stabbing and rates it 7/10.  Patient has a history of constipation but states her last bowel movement was this morning.  She does have nausea but states it has been going on longer than her current abdominal pain.  Patient also reports itchy skin all over her body for the past 5 days.  She has had no changes in  her medications.  There is no rash.  States she has not changed any products including laundry detergent, fabric softeners, lotion, body wash, shampoo, or conditioner.   Social history:  Relevant past medical, surgical, family and social history reviewed and updated as indicated. Interim medical history since our last visit reviewed.  Allergies and medications reviewed and updated.  DATA REVIEWED: CHART IN EPIC  ROS: Negative unless specifically indicated above in HPI.    Current Outpatient Medications:  .  ALPRAZolam (XANAX) 1 MG tablet, TAKE 1/2 TO 1 TABLET BY MOUTH THREE TIMES DAILY AS NEEDED FOR ANXIETY, Disp: 90 tablet, Rfl: 2 .  atomoxetine (STRATTERA) 80 MG capsule, TAKE 1 CAPSULE BY MOUTH EVERY DAY, Disp: 30 capsule, Rfl: 1 .  busPIRone (BUSPAR) 30 MG tablet, TAKE 1 TABLET BY MOUTH TWICE DAILY, Disp: 60 tablet, Rfl: 1 .  cariprazine (VRAYLAR) capsule, Take 1 capsule (1.5 mg total) by mouth daily., Disp: 30 capsule, Rfl: 0 .  cloNIDine HCl (KAPVAY) 0.1 MG TB12 ER tablet, TAKE 1 TABLET BY MOUTH AT BEDTIME FOR 2 DAYS, THEN TAKE 1 TABLET TWICE DAILY, Disp: 60 tablet, Rfl: 1 .  DULoxetine (CYMBALTA) 60 MG capsule, TAKE TWO CAPSULES BY MOUTH EVERY DAY, Disp: 60 capsule, Rfl: 2 .  gabapentin (NEURONTIN) 800 MG tablet, TAKE 1 TABLET BY MOUTH TWICE DAILY, Disp: 60 tablet, Rfl: 1 .  lithium carbonate 150 MG capsule, TAKE ONE CAPSULE BY MOUTH AT BEDTIME, Disp: 30 capsule, Rfl: 2 .  metFORMIN (GLUCOPHAGE) 1000 MG tablet, TAKE 1 TABLET BY MOUTH DAILY WITH FOOD, Disp: 30 tablet, Rfl: 2 .  norethindrone-ethinyl estradiol (LOESTRIN FE) 1-20 MG-MCG tablet, Take 1 tablet by mouth daily., Disp: 28 tablet, Rfl: 11 .  QUEtiapine (SEROQUEL) 50 MG tablet, TAKE 1 TABLET BY MOUTH AT BEDTIME, Disp: 30 tablet, Rfl: 1 .  zolpidem (AMBIEN CR) 12.5 MG CR tablet, TAKE 1 TABLET BY MOUTH AT BEDTIME AS NEEDED FOR SLEEP, Disp: 30 tablet, Rfl: 2   No Known Allergies Past Medical History:  Diagnosis Date  .  Anxiety   . Depression   . Heart murmur     Past Surgical History:  Procedure Laterality Date  . CARDIAC SURGERY    . CLAVICLE SURGERY    . HIP OPEN REDUCTION  1994  . open heart   1996  . PARTIAL HIP ARTHROPLASTY      Social History   Socioeconomic History  . Marital status: Single    Spouse name: Not on file  . Number of children: Not on file  . Years of education: Not on file  . Highest education level: Not on file  Occupational History  . Not on file  Tobacco Use  . Smoking status: Current Every Day Smoker    Packs/day: 0.50    Types: Cigarettes  . Smokeless tobacco: Never Used  Vaping Use  . Vaping Use: Every day  . Substances: Nicotine, Flavoring  Substance and Sexual Activity  . Alcohol use: Yes    Alcohol/week: 0.0 - 1.0 standard drinks  . Drug use: No  . Sexual activity: Never  Other Topics Concern  . Not on file  Social History Narrative   ** Merged History Encounter **       Social Determinants of Health   Financial Resource Strain: Low Risk   . Difficulty of Paying Living Expenses: Not hard at all  Food Insecurity: No Food Insecurity  . Worried About Charity fundraiser in the Last Year: Never true  . Ran Out of Food in the Last Year: Never true  Transportation Needs: No Transportation Needs  . Lack of Transportation (Medical): No  . Lack of Transportation (Non-Medical): No  Physical Activity: Inactive  . Days of Exercise per Week: 0 days  . Minutes of Exercise per Session: 0 min  Stress: No Stress Concern Present  . Feeling of Stress : Only a little  Social Connections: Socially Isolated  . Frequency of Communication with Friends and Family: More than three times a week  . Frequency of Social Gatherings with Friends and Family: Once a week  . Attends Religious Services: Never  . Active Member of Clubs or Organizations: No  . Attends Archivist Meetings: Never  . Marital Status: Never married  Intimate Partner Violence: Not At Risk    . Fear of Current or Ex-Partner: No  . Emotionally Abused: No  . Physically Abused: No  . Sexually Abused: No        Objective:    BP (!) 128/91   Pulse 80   Temp 97.7 F (36.5 C) (Temporal)   Ht _0  (1.626 m)   Wt 193 lb 6.4 oz (87.7 kg)   LMP 07/29/2020 (Approximate)   SpO2 99%   BMI 33.20 kg/m   Wt Readings from Last 3 Encounters:  08/12/20 193 lb 6.4 oz (87.7 kg)  07/03/20 196  lb (88.9 kg)  12/14/19 240 lb (108.9 kg)    Physical Exam Vitals reviewed.  Constitutional:      General: She is not in acute distress.    Appearance: Normal appearance. She is obese. She is not ill-appearing, toxic-appearing or diaphoretic.  HENT:     Head: Normocephalic and atraumatic.  Eyes:     General: No scleral icterus.       Right eye: No discharge.        Left eye: No discharge.     Conjunctiva/sclera: Conjunctivae normal.  Cardiovascular:     Rate and Rhythm: Normal rate.  Pulmonary:     Effort: Pulmonary effort is normal. No respiratory distress.  Abdominal:     General: Abdomen is flat. Bowel sounds are normal. There is no distension or abdominal bruit. There are no signs of injury.     Palpations: Abdomen is soft. There is no shifting dullness, fluid wave, hepatomegaly, splenomegaly, mass or pulsatile mass.     Tenderness: There is abdominal tenderness.  Musculoskeletal:        General: Normal range of motion.     Cervical back: Normal range of motion.  Skin:    General: Skin is warm and dry.     Capillary Refill: Capillary refill takes less than 2 seconds.  Neurological:     General: No focal deficit present.     Mental Status: She is alert and oriented to person, place, and time. Mental status is at baseline.  Psychiatric:        Mood and Affect: Mood normal.        Behavior: Behavior normal.        Thought Content: Thought content normal.        Judgment: Judgment normal.     No results found for: TSH Lab Results  Component Value Date   WBC 23.0 (H)  12/14/2019   HGB 15.0 12/14/2019   HCT 44.0 12/14/2019   MCV 86.2 12/14/2019   PLT 287 12/14/2019   Lab Results  Component Value Date   NA 138 12/14/2019   K 3.4 (L) 12/14/2019   CO2 21 (L) 12/14/2019   GLUCOSE 122 (H) 12/14/2019   BUN 8 12/14/2019   CREATININE 0.70 12/14/2019   BILITOT 0.7 12/14/2019   ALKPHOS 62 12/14/2019   AST 26 12/14/2019   ALT 17 12/14/2019   PROT 6.6 12/14/2019   ALBUMIN 3.6 12/14/2019   CALCIUM 9.3 12/14/2019   ANIONGAP 14 12/14/2019   No results found for: CHOL No results found for: HDL No results found for: LDLCALC No results found for: TRIG No results found for: CHOLHDL No results found for: HGBA1C
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
## 2020-08-13 ENCOUNTER — Ambulatory Visit: Payer: Self-pay | Admitting: Family Medicine

## 2020-08-13 ENCOUNTER — Other Ambulatory Visit: Payer: Self-pay | Admitting: Family Medicine

## 2020-08-13 ENCOUNTER — Other Ambulatory Visit: Payer: Self-pay | Admitting: Physician Assistant

## 2020-08-13 ENCOUNTER — Telehealth: Payer: Self-pay | Admitting: Family Medicine

## 2020-08-13 DIAGNOSIS — K769 Liver disease, unspecified: Secondary | ICD-10-CM

## 2020-08-13 LAB — CBC WITH DIFFERENTIAL/PLATELET
Basophils Absolute: 0.1 10*3/uL (ref 0.0–0.2)
Basos: 0 %
EOS (ABSOLUTE): 0.2 10*3/uL (ref 0.0–0.4)
Eos: 1 %
Hematocrit: 43.7 % (ref 34.0–46.6)
Hemoglobin: 14.4 g/dL (ref 11.1–15.9)
Immature Grans (Abs): 0 10*3/uL (ref 0.0–0.1)
Immature Granulocytes: 0 %
Lymphocytes Absolute: 3.7 10*3/uL — ABNORMAL HIGH (ref 0.7–3.1)
Lymphs: 28 %
MCH: 27.1 pg (ref 26.6–33.0)
MCHC: 33 g/dL (ref 31.5–35.7)
MCV: 82 fL (ref 79–97)
Monocytes Absolute: 0.6 10*3/uL (ref 0.1–0.9)
Monocytes: 4 %
Neutrophils Absolute: 8.6 10*3/uL — ABNORMAL HIGH (ref 1.4–7.0)
Neutrophils: 67 %
Platelets: 274 10*3/uL (ref 150–450)
RBC: 5.32 x10E6/uL — ABNORMAL HIGH (ref 3.77–5.28)
RDW: 14.5 % (ref 11.7–15.4)
WBC: 13.1 10*3/uL — ABNORMAL HIGH (ref 3.4–10.8)

## 2020-08-13 LAB — CMP14+EGFR
ALT: 12 IU/L (ref 0–32)
AST: 13 IU/L (ref 0–40)
Albumin/Globulin Ratio: 1.6 (ref 1.2–2.2)
Albumin: 4.1 g/dL (ref 3.9–5.0)
Alkaline Phosphatase: 75 IU/L (ref 44–121)
BUN/Creatinine Ratio: 8 — ABNORMAL LOW (ref 9–23)
BUN: 6 mg/dL (ref 6–20)
Bilirubin Total: 0.2 mg/dL (ref 0.0–1.2)
CO2: 22 mmol/L (ref 20–29)
Calcium: 9.3 mg/dL (ref 8.7–10.2)
Chloride: 103 mmol/L (ref 96–106)
Creatinine, Ser: 0.78 mg/dL (ref 0.57–1.00)
GFR calc Af Amer: 121 mL/min/{1.73_m2} (ref >59)
GFR calc non Af Amer: 105 mL/min/{1.73_m2} (ref >59)
Globulin, Total: 2.6 g/dL (ref 1.5–4.5)
Glucose: 71 mg/dL (ref 65–99)
Potassium: 4.3 mmol/L (ref 3.5–5.2)
Sodium: 141 mmol/L (ref 134–144)
Total Protein: 6.7 g/dL (ref 6.0–8.5)

## 2020-08-13 LAB — TSH: TSH: 1.27 u[IU]/mL (ref 0.450–4.500)

## 2020-08-13 NOTE — Telephone Encounter (Signed)
Last apt 06/29

## 2020-08-13 NOTE — Telephone Encounter (Signed)
I have called and discussed with patient. She will call GYN. I also routed results to GYN.

## 2020-08-13 NOTE — Telephone Encounter (Signed)
Please review and advise.

## 2020-08-14 ENCOUNTER — Telehealth: Payer: Self-pay | Admitting: Adult Health

## 2020-08-14 ENCOUNTER — Ambulatory Visit: Payer: Self-pay | Admitting: Mental Health

## 2020-08-14 DIAGNOSIS — F411 Generalized anxiety disorder: Secondary | ICD-10-CM

## 2020-08-14 MED ORDER — NAPROXEN SODIUM 550 MG PO TABS: 550.0000 mg | ORAL_TABLET | Freq: Two times a day (BID) | ORAL | 1 refills | Status: DC

## 2020-08-14 NOTE — Addendum Note (Signed)
Addended by: Derrek Monaco A on: 08/14/2020 12:09 PM   Modules accepted: Orders

## 2020-08-14 NOTE — Telephone Encounter (Signed)
Had 3.3 cm simple cyst left ovary likely follicular,seen on CT 8/37 complains of pain,using heating pad, request something for pain will rx anaprox ds and has appt next week, or can alternate tylenol and advil. She started OCs about a month ago

## 2020-08-14 NOTE — Telephone Encounter (Signed)
Telephoned patient at home number and patient states having a lot of pain from cyst. Using heating pad and ibuprofen but not relief. Would like medication for pain. Advised patient if not heard back from office to check with pharmacy later. Patient voiced understanding.

## 2020-08-14 NOTE — Progress Notes (Signed)
    Patient ID: Paula Elliott,  MRN: [id number] Date:  07/22/20  Pt canceled appt due to illness.

## 2020-08-14 NOTE — Telephone Encounter (Signed)
Patient states records from scans of cyst on ovary was sent recent. Patient states the cyst is about 3cm in size and she is in a lot of pain. Patient is scheduled to see JG on 9/21 but would like to know if somethingcan be sent to pharmacy to help relieve symptoms.

## 2020-08-15 ENCOUNTER — Other Ambulatory Visit: Payer: Self-pay | Admitting: Family Medicine

## 2020-08-15 DIAGNOSIS — K769 Liver disease, unspecified: Secondary | ICD-10-CM

## 2020-08-15 NOTE — Progress Notes (Signed)
MRI order changed

## 2020-08-16 ENCOUNTER — Encounter: Payer: Self-pay | Admitting: Family Medicine

## 2020-08-19 ENCOUNTER — Ambulatory Visit: Payer: BC Managed Care – PPO | Admitting: Adult Health

## 2020-08-19 ENCOUNTER — Encounter: Payer: Self-pay | Admitting: Adult Health

## 2020-08-19 VITALS — BP 127/90 | HR 97 | Ht 64.0 in | Wt 193.5 lb

## 2020-08-19 DIAGNOSIS — R102 Pelvic and perineal pain: Secondary | ICD-10-CM | POA: Diagnosis not present

## 2020-08-19 DIAGNOSIS — N83202 Unspecified ovarian cyst, left side: Secondary | ICD-10-CM

## 2020-08-19 MED ORDER — CYCLOBENZAPRINE HCL 10 MG PO TABS: 10.0000 mg | ORAL_TABLET | Freq: Three times a day (TID) | ORAL | 1 refills | Status: DC | PRN

## 2020-08-19 NOTE — Progress Notes (Signed)
°  Subjective:     Patient ID: Paula Elliott, female   DOB: [birth year], 29 y.o.   MRN: [id number]  HPI Paula Elliott is a 29 year old white female,single, G0P0 in complaining of LLQ pain had CT 08/12/20 showed 3.3 cm simple cyst. PCP is Hendricks Limes NP  Review of Systems Pain in LLQ,pain sharp at times  Reviewed past medical,surgical, social and family history. Reviewed medications and allergies.     Objective:   Physical Exam BP 127/90 (BP Location: Right Arm, Patient Position: Sitting, Cuff Size: Normal)    Pulse 97    Ht 5\' 4"  (1.626 m)    Wt 193 lb 8 oz (87.8 kg)    LMP 07/29/2020 (Approximate)    BMI 33.21 kg/m  Skin warm and dry.Pelvic: external genitalia is normal in appearance no lesions, vagina: white discharge without odor,urethra has no lesions or masses noted, cervix:smooth, uterus: normal size, shape and contour, non tender, no masses felt, adnexa: no masses +LLQ tenderness noted. Bladder is non tender and no masses felt.      [facility] - 08/19/20 1013      Pregnancy Intention Screening   Does the patient want to become pregnant in the next year? No    Does the patient's partner want to become pregnant in the next year? No    Would the patient like to discuss contraceptive options today? No      Contraception Wrap Up   Current Method Abstinence    End Method Abstinence    Contraception Counseling Provided No           Assessment:     1. Pelvic pain Continue  Anaprox will add flexeril Meds ordered this encounter  Medications   cyclobenzaprine (FLEXERIL) 10 MG tablet    Sig: Take 1 tablet (10 mg total) by mouth every 8 (eight) hours as needed for muscle spasms.    Dispense:  30 tablet    Refill:  1    Order Specific Question:   Supervising Provider    Answer:   Florian Buff [[id number]]  Will get pelvic US at [facility] 9/24 at 3:30 pm And follow up with me in 2 weeks   2. Cyst of left ovary Will get pelvic US,pt aware I am out of office and Maudie Mercury will text on  Mychart     Plan:     Follow up in 2 weeks

## 2020-08-22 ENCOUNTER — Ambulatory Visit (HOSPITAL_COMMUNITY)
Admission: RE | Admit: 2020-08-22 | Discharge: 2020-08-22 | Disposition: A | Discharge status: Home / Self Care | Payer: BC Managed Care – PPO | Source: Ambulatory Visit | Attending: Adult Health | Admitting: Adult Health

## 2020-08-22 ENCOUNTER — Other Ambulatory Visit: Payer: Self-pay

## 2020-08-22 DIAGNOSIS — N83202 Unspecified ovarian cyst, left side: Secondary | ICD-10-CM | POA: Diagnosis not present

## 2020-08-22 DIAGNOSIS — R102 Pelvic and perineal pain: Secondary | ICD-10-CM | POA: Diagnosis not present

## 2020-08-22 DIAGNOSIS — N83292 Other ovarian cyst, left side: Secondary | ICD-10-CM | POA: Diagnosis not present

## 2020-08-22 DIAGNOSIS — N888 Other specified noninflammatory disorders of cervix uteri: Secondary | ICD-10-CM | POA: Diagnosis not present

## 2020-08-22 DIAGNOSIS — N83201 Unspecified ovarian cyst, right side: Secondary | ICD-10-CM | POA: Diagnosis not present

## 2020-08-28 ENCOUNTER — Other Ambulatory Visit: Payer: Self-pay

## 2020-08-28 ENCOUNTER — Ambulatory Visit (HOSPITAL_COMMUNITY)
Admission: RE | Admit: 2020-08-28 | Discharge: 2020-08-28 | Disposition: A | Discharge status: Home / Self Care | Payer: BC Managed Care – PPO | Source: Ambulatory Visit | Attending: Family Medicine | Admitting: Family Medicine

## 2020-08-28 DIAGNOSIS — K7689 Other specified diseases of liver: Secondary | ICD-10-CM | POA: Diagnosis not present

## 2020-08-28 DIAGNOSIS — K769 Liver disease, unspecified: Secondary | ICD-10-CM | POA: Diagnosis not present

## 2020-08-28 MED ORDER — GADOBUTROL 1 MMOL/ML IV SOLN
10.0000 mL | Freq: Once | INTRAVENOUS | Status: AC | PRN
  Administered 2020-08-28: 10 mL via INTRAVENOUS

## 2020-09-03 ENCOUNTER — Ambulatory Visit: Payer: Self-pay | Admitting: Mental Health

## 2020-09-04 ENCOUNTER — Ambulatory Visit: Payer: BC Managed Care – PPO | Admitting: Adult Health

## 2020-09-07 ENCOUNTER — Other Ambulatory Visit: Payer: Self-pay | Admitting: Physician Assistant

## 2020-09-08 NOTE — Telephone Encounter (Signed)
Last apt 06/29 was due back 4 weeks

## 2020-09-11 DIAGNOSIS — M4307 Spondylolysis, lumbosacral region: Secondary | ICD-10-CM | POA: Diagnosis not present

## 2020-09-15 ENCOUNTER — Telehealth: Payer: Self-pay | Admitting: Physician Assistant

## 2020-09-15 NOTE — Telephone Encounter (Signed)
Please review

## 2020-09-15 NOTE — Telephone Encounter (Signed)
Paula Elliott called to make follow up appt.  She is scheduled for 10/16/20.  Please refill prescriptions until she can get in.

## 2020-09-16 ENCOUNTER — Other Ambulatory Visit: Payer: Self-pay

## 2020-09-16 MED ORDER — METFORMIN HCL 1000 MG PO TABS: 1000.0000 mg | ORAL_TABLET | Freq: Every day | ORAL | 0 refills | Status: DC

## 2020-09-16 MED ORDER — LITHIUM CARBONATE 150 MG PO CAPS: 150.0000 mg | ORAL_CAPSULE | Freq: Every day | ORAL | 0 refills | Status: DC

## 2020-09-16 MED ORDER — DULOXETINE HCL 60 MG PO CPEP: 120.0000 mg | ORAL_CAPSULE | Freq: Every day | ORAL | 0 refills | Status: DC

## 2020-09-16 MED ORDER — GABAPENTIN 800 MG PO TABS: 800.0000 mg | ORAL_TABLET | Freq: Two times a day (BID) | ORAL | 0 refills | Status: DC

## 2020-09-16 MED ORDER — ZOLPIDEM TARTRATE ER 12.5 MG PO TBCR: 12.5000 mg | EXTENDED_RELEASE_TABLET | Freq: Every evening | ORAL | 0 refills | Status: DC | PRN

## 2020-09-16 MED ORDER — QUETIAPINE FUMARATE 50 MG PO TABS: 50.0000 mg | ORAL_TABLET | Freq: Every day | ORAL | 0 refills | Status: DC

## 2020-09-16 MED ORDER — BUSPIRONE HCL 30 MG PO TABS: 30.0000 mg | ORAL_TABLET | Freq: Two times a day (BID) | ORAL | 0 refills | Status: DC

## 2020-09-16 MED ORDER — ALPRAZOLAM 1 MG PO TABS: ORAL_TABLET | ORAL | 0 refills | Status: DC

## 2020-09-16 MED ORDER — ATOMOXETINE HCL 80 MG PO CAPS: ORAL_CAPSULE | ORAL | 0 refills | Status: DC

## 2020-09-16 NOTE — Telephone Encounter (Signed)
9 Rx's pended for Helene Kelp to review and send

## 2020-09-16 NOTE — Telephone Encounter (Signed)
Yes if you do not mind, pend all of her medications for me, for 1 month supply.  Thank you.

## 2020-09-17 DIAGNOSIS — Z6832 Body mass index (BMI) 32.0-32.9, adult: Secondary | ICD-10-CM | POA: Diagnosis not present

## 2020-09-17 DIAGNOSIS — M5126 Other intervertebral disc displacement, lumbar region: Secondary | ICD-10-CM | POA: Diagnosis not present

## 2020-09-17 DIAGNOSIS — M5416 Radiculopathy, lumbar region: Secondary | ICD-10-CM | POA: Diagnosis not present

## 2020-10-06 ENCOUNTER — Other Ambulatory Visit: Payer: Self-pay | Admitting: Physician Assistant

## 2020-10-16 ENCOUNTER — Ambulatory Visit (INDEPENDENT_AMBULATORY_CARE_PROVIDER_SITE_OTHER): Payer: BC Managed Care – PPO | Admitting: Physician Assistant

## 2020-10-16 ENCOUNTER — Encounter: Payer: Self-pay | Admitting: Physician Assistant

## 2020-10-16 ENCOUNTER — Other Ambulatory Visit: Payer: Self-pay

## 2020-10-16 VITALS — BP 155/109 | HR 75

## 2020-10-16 DIAGNOSIS — F319 Bipolar disorder, unspecified: Secondary | ICD-10-CM

## 2020-10-16 DIAGNOSIS — F411 Generalized anxiety disorder: Secondary | ICD-10-CM | POA: Diagnosis not present

## 2020-10-16 DIAGNOSIS — F900 Attention-deficit hyperactivity disorder, predominantly inattentive type: Secondary | ICD-10-CM | POA: Diagnosis not present

## 2020-10-16 DIAGNOSIS — F99 Mental disorder, not otherwise specified: Secondary | ICD-10-CM

## 2020-10-16 DIAGNOSIS — Z789 Other specified health status: Secondary | ICD-10-CM

## 2020-10-16 DIAGNOSIS — Z7289 Other problems related to lifestyle: Secondary | ICD-10-CM

## 2020-10-16 DIAGNOSIS — F5105 Insomnia due to other mental disorder: Secondary | ICD-10-CM | POA: Diagnosis not present

## 2020-10-16 DIAGNOSIS — G2581 Restless legs syndrome: Secondary | ICD-10-CM

## 2020-10-16 MED ORDER — BREXPIPRAZOLE 1 MG PO TABS: 1.0000 mg | ORAL_TABLET | Freq: Every morning | ORAL | 1 refills | Status: DC

## 2020-10-16 MED ORDER — DULOXETINE HCL 60 MG PO CPEP: 120.0000 mg | ORAL_CAPSULE | Freq: Every day | ORAL | 1 refills | Status: DC

## 2020-10-16 MED ORDER — GUANFACINE HCL ER 2 MG PO TB24: 2.0000 mg | ORAL_TABLET | Freq: Every day | ORAL | 1 refills | Status: DC

## 2020-10-16 MED ORDER — BUSPIRONE HCL 30 MG PO TABS: 30.0000 mg | ORAL_TABLET | ORAL | 1 refills | Status: DC

## 2020-10-16 MED ORDER — GABAPENTIN 800 MG PO TABS: 800.0000 mg | ORAL_TABLET | Freq: Two times a day (BID) | ORAL | 1 refills | Status: DC

## 2020-10-16 NOTE — Patient Instructions (Signed)
Wean off the Xanax 1 mg by taking 1/2 daily for 1 week, then stop. If you feel more anxious and shaky in a day or two after stopping it,, call the office and I'll send in a lower dose to help wean more slowly.

## 2020-10-16 NOTE — Progress Notes (Signed)
[facility]  Patient ID: Paula Elliott,  MRN: [id number]  PCP: Loman Brooklyn, FNP  Date of Evaluation: 10/16/2020 Time spent:40 minutes  Chief Complaint:  Chief Complaint    Anxiety; Depression; ADD; Insomnia      HISTORY/CURRENT STATUS: Having trouble concentrating.   Sueko was last seen in June, when she was started on Kapvay.  She was supposed to have returned in 1 month but did not.  Since then she d/c all night meds on her own. Also d/c the Strattera about a month ago. "Nothing helps."  She called about a month ago to get refills on her medications and did restart them, except lithium.  She now presents requesting a stimulant.  The Strattera nor of the Kapvay helped with concentration.  It is difficult for her to concentrate without being so easily distracted.  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  She has a very hard time focusing and finishing tasks in a timely manner.  She has had problems with attention for years.  Never addressed as a child that she remembers.  The anxiety is still a problem.  She gets more anxious when she cannot focus at work and feels like she is going to mess up.  The Xanax does help that.  She is usually only taking 1 mg/day.  During our conversation and obtaining updated social history, Shelsy states that she will get together with girlfriends on the weekends and she will drink 1/5 of vodka.  This has happened 3 times this month.  She does it to get drunk and has blacked out on several occasions.  She is able to enjoy things, energy and motivation are fair to good.  Not crying easily.  She does have a hard time sleeping.  Personal hygiene is normal.  No suicidal or homicidal thoughts.  Patient denies increased energy with decreased need for sleep, no increased talkativeness, no racing thoughts, no impulsivity or risky behaviors, no increased spending, no increased libido, no  grandiosity, no increased irritability or anger, no paranoia and no hallucinations.  Denies dizziness, syncope, seizures, numbness, tingling, tremor, tics, unsteady gait, slurred speech, confusion. Denies muscle or joint pain, stiffness, or dystonia.Denies unexplained weight loss, frequent infections, or sores that heal slowly.  No polyphagia, polydipsia, or polyuria. Denies visual changes or paresthesias.   Individual Medical History/ Review of Systems: Changes? :No    Past medications for mental health diagnoses include: Wellbutrin, Xanax, Ativan, Provigil, Latuda, Lamictal, Abilify, Prozac, Luvox, Valium, Seroquel, Strattera and Kapvay did not help attention.  Allergies: Patient has no known allergies.  Current Medications:  Current Outpatient Medications:    busPIRone (BUSPAR) 30 MG tablet, Take 1 tablet (30 mg total) by mouth every morning., Disp: 30 tablet, Rfl: 1   cyclobenzaprine (FLEXERIL) 10 MG tablet, Take 1 tablet (10 mg total) by mouth every 8 (eight) hours as needed for muscle spasms., Disp: 30 tablet, Rfl: 1   DULoxetine (CYMBALTA) 60 MG capsule, Take 2 capsules (120 mg total) by mouth daily., Disp: 60 capsule, Rfl: 1   gabapentin (NEURONTIN) 800 MG tablet, Take 1 tablet (800 mg total) by mouth 2 (two) times daily. 1 qd, Disp: 30 tablet, Rfl: 1   metFORMIN (GLUCOPHAGE) 1000 MG tablet, TAKE 1 TABLET BY MOUTH EVERY DAY WITH FOOD, Disp: 30 tablet, Rfl: 0   naproxen sodium (ANAPROX DS) 550 MG tablet, Take 1 tablet (550 mg total) by mouth 2 (two) times daily with  a meal., Disp: 60 tablet, Rfl: 1   norethindrone-ethinyl estradiol (LOESTRIN FE) 1-20 MG-MCG tablet, Take 1 tablet by mouth daily., Disp: 28 tablet, Rfl: 11   brexpiprazole (REXULTI) 1 MG TABS tablet, Take 1 tablet (1 mg total) by mouth in the morning., Disp: 30 tablet, Rfl: 1   guanFACINE (INTUNIV) 2 MG TB24 ER tablet, Take 1 tablet (2 mg total) by mouth daily., Disp: 30 tablet, Rfl: 1 Medication Side Effects:  none  Family Medical/ Social History: Changes? No  MENTAL HEALTH EXAM:  Blood pressure (!) 155/109, pulse 75.There is no height or weight on file to calculate BMI.     General Appearance: Casual, Neat, Well Groomed and Obese  Eye Contact:  Good  Speech:  Clear and Coherent and Normal Rate  Volume:  Normal  Mood:  Anxious  Affect:  Anxious and she was rushing to get here for the work in appointment.  Thought Process:  Goal Directed and Descriptions of Associations: Intact  Orientation:  Full (Time, Place, and Person)  Thought Content: Logical   Suicidal Thoughts:  No  Homicidal Thoughts:  No  Memory:  WNL  Judgement:  Good  Insight:  Good  Psychomotor Activity:  Normal  Concentration:  Concentration: Fair and Attention Span: Fair  Recall:  Good  Fund of Knowledge: Good  Language: Good  Assets:  Desire for Improvement  ADL's:  Intact  Cognition: WNL  Prognosis:  Good    DIAGNOSES:    ICD-10-CM   1. Bipolar I disorder ([place])  F31.9   2. Attention deficit hyperactivity disorder (ADHD), predominantly inattentive type  F90.0   3. Generalized anxiety disorder  F41.1   4. Insomnia due to other mental disorder  F51.05    F99   5. Restless leg syndrome  G25.81   6. Alcohol use  Z72.89     Receiving Psychotherapy: Yes With [name], [facility]   RECOMMENDATIONS:  PDMP reviewed. I provided 40 minutes of face-to-face time during this encounter. We had a long discussion about her alcohol use.  I am unable to prescribe benzodiazepines because of her alcohol intake.  I explained that both medications are downers, both are addictive, they can cause respiratory depression, and even death.  We will wean her off of the Xanax.  She still has enough to wean.  She is not happy about this decision and states she needs that and something to help her focus.  I explained there are many other ways to treat anxiety and ADD than using controlled substances. I do believe she needs to be on an  antipsychotic given her history of bipolar disorder and recommend Rexulti.  We discussed the benefits, risk and side effects and she accepts.   I recommend that she start Intuniv for the ADD.  Benefits and risk were discussed, including low blood pressure.  Call if she has dizziness, presyncope, or syncope.  Her BP is elevated today, as it has been in the past.  So the Intuniv may be helpful in that regard as well.  I do recommend she talk to her PCP about this. She is not happy that I am not prescribing a stimulant, Xanax, or Ambien. Recommend she decrease alcohol to no more than 1 drink per day.  AA if needed. Wean off Xanax 1 mg by taking one half p.o. daily for 1 week and then stop.  She states he is only taking 1 mg/day now.  If she does have more anxiety or shakiness after stopping  it for a few days, call and I will send in a lower dose for her to wean more slowly. Start Rexulti 1 mg, 1 p.o. every morning. Start Intuniv 2 mg p.o. daily. Continue BuSpar 30 mg 1 p.o. twice daily. Continue Cymbalta 60 mg, 2 p.o. daily. Continue gabapentin 800 mg, 1 p.o. twice daily. Continue metformin 1 g daily. Continue vitamins and supplements, melatonin for sleep. Continue therapy with [name], [facility]. Return in 6 weeks.  Donnal Moat, PA-C

## 2020-10-26 DIAGNOSIS — F900 Attention-deficit hyperactivity disorder, predominantly inattentive type: Secondary | ICD-10-CM | POA: Insufficient documentation

## 2020-11-10 DIAGNOSIS — F3181 Bipolar II disorder: Secondary | ICD-10-CM | POA: Diagnosis not present

## 2020-11-10 DIAGNOSIS — F418 Other specified anxiety disorders: Secondary | ICD-10-CM | POA: Diagnosis not present

## 2020-11-11 ENCOUNTER — Other Ambulatory Visit: Payer: Self-pay | Admitting: Physician Assistant

## 2020-11-18 ENCOUNTER — Telehealth: Payer: Self-pay

## 2020-11-18 NOTE — Telephone Encounter (Signed)
Prior authorization submitted and approved for REXULTI 1 MG effective 11/18/2020-11/17/2021 with [employer] grp # [id number]

## 2020-11-26 ENCOUNTER — Other Ambulatory Visit: Payer: Self-pay

## 2020-11-26 ENCOUNTER — Ambulatory Visit (INDEPENDENT_AMBULATORY_CARE_PROVIDER_SITE_OTHER): Payer: BC Managed Care – PPO | Admitting: Mental Health

## 2020-11-26 DIAGNOSIS — F319 Bipolar disorder, unspecified: Secondary | ICD-10-CM

## 2020-12-04 DIAGNOSIS — F3181 Bipolar II disorder: Secondary | ICD-10-CM | POA: Diagnosis not present

## 2020-12-04 DIAGNOSIS — F418 Other specified anxiety disorders: Secondary | ICD-10-CM | POA: Diagnosis not present

## 2020-12-08 ENCOUNTER — Ambulatory Visit: Payer: BC Managed Care – PPO | Admitting: Physician Assistant

## 2020-12-23 NOTE — Progress Notes (Signed)
      [facility] Counselor/Therapist Progress Note   Patient ID: Paula Elliott,  MRN: [id number] Date:  11/26/20  Timespent: 54 minutes    Treatment Type: Individual therapy   Mental Status Exam:    Appearance:   Casual, appropriate  Behavior:  WNL  Motor:  WNL  Speech/Language:   Clear and Coherent  Affect:  Full range  Mood:  Anxious, pleasant  Thought process:  normal  Thought content:   WNL, denies SI/HI  Sensory/Perceptual disturbances:   none  Orientation:  oriented to person, place and time/date  Attention:  Good  Concentration:  Good  Memory:  WNL  Fund of knowledge:   Good  Insight:   developing  Judgment:   good  Impulse Control:  good   Reported Symptoms: Depressed mood most days, anxiety, rumination, hx of mood swings, crying spells    Risk Assessment: Danger to Self: No Self-injurious Behavior:  Positive history, none in the past few months Danger to Others: No Duty to Warn:no Physical Aggression / Violence:No  Access to Firearms a concern: No  Gang Involvement:No  Patient / guardian was educated about steps to take if suicide or homicide risk level increases between visits. While future psychiatric events cannot be accurately predicted, the patient does not currently require acute inpatient psychiatric care and does not currently meet [facility] involuntary commitment criteria.  Subjective:   Patient arrived on time for today's session sharing progress since her last visit which was a few months ago.  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  She went on to share other stressors, namely her father who was recently diagnosed with cancer, patient went on to share feelings related as it is a serious situation and he is receiving treatment in a residential setting.  She stated that she has felt more agitated  recently, as limited support in terms of friendships.  She stated that she has found work friends who have been highly supportive but identifies the need for having a personal support system more present in her life.  We explored coping and ways to care for herself due to significant concerns in some anxiety she has about her father situation medically.  Facilitated her identifying thoughts to cope with the recent stressors identified today.    Interventions: Cognitive Behavioral Therapy, supportive therapy, strength based approaches  Diagnosis:   ICD-10-CM   1. Bipolar I disorder ([place])  F31.9      Plan: Patient is to use CBT, mindfulness and coping skills to help manage decrease symptoms associated with their diagnosis. Patient to follow through with homework assignments given.   Long-term goal:   Reduce overall level, frequency, and intensity of the feelings of depression and anxiety 7/10 to a 0-2/10 in severity for at least 3 consecutive months.   Short-term goal:  Decrease "deflating, self-doubting" and catastrophizing thinking style Decrease anxiety producing self talk such as thinking of the worse possible life outcomes Decrease panic episodes by utilizing coping skills discussed in session.  Patient to utilize coping as discussed in session Patient to take medications as prescribed and report any concerns to her prescribing provider   Assessment of progress:  progressing   Anson [place], [facility]

## 2020-12-24 ENCOUNTER — Ambulatory Visit: Payer: BC Managed Care – PPO | Admitting: Mental Health

## 2020-12-25 DIAGNOSIS — F418 Other specified anxiety disorders: Secondary | ICD-10-CM | POA: Diagnosis not present

## 2020-12-25 DIAGNOSIS — F3181 Bipolar II disorder: Secondary | ICD-10-CM | POA: Diagnosis not present

## 2021-01-07 ENCOUNTER — Telehealth: Payer: Self-pay | Admitting: Adult Health

## 2021-01-07 NOTE — Telephone Encounter (Signed)
Pt's pharmacy [facility] [place] for future refills. [place]

## 2021-01-07 NOTE — Telephone Encounter (Signed)
Patient called stating that she would like to change her pharmacy for the next time she needs a refill of her medication. [facility] [place].  Patient does not need a call back.

## 2021-01-08 ENCOUNTER — Ambulatory Visit (INDEPENDENT_AMBULATORY_CARE_PROVIDER_SITE_OTHER): Payer: BC Managed Care – PPO | Admitting: Mental Health

## 2021-01-08 ENCOUNTER — Other Ambulatory Visit: Payer: Self-pay

## 2021-01-08 DIAGNOSIS — F319 Bipolar disorder, unspecified: Secondary | ICD-10-CM | POA: Diagnosis not present

## 2021-01-14 ENCOUNTER — Telehealth: Payer: Self-pay | Admitting: Adult Health

## 2021-01-14 MED ORDER — NORETHIN ACE-ETH ESTRAD-FE 1-20 MG-MCG PO TABS: 1.0000 | ORAL_TABLET | Freq: Every day | ORAL | 11 refills | Status: DC

## 2021-01-14 NOTE — Addendum Note (Signed)
Addended by: Derrek Monaco A on: 01/14/2021 03:41 PM   Modules accepted: Orders

## 2021-01-14 NOTE — Telephone Encounter (Signed)
Patient called stating she would like a refill of her birth control sent to her new pharmacy [facility] [place]. Please contact pt when sent

## 2021-01-14 NOTE — Telephone Encounter (Signed)
Pt is requesting Larin Fe be sent to her new pharmacy[facility] [place]. Thanks!! [place]

## 2021-01-14 NOTE — Telephone Encounter (Signed)
[facility]

## 2021-01-16 NOTE — Progress Notes (Signed)
[facility] Counselor/Therapist Progress Note   Patient ID: Paula Elliott,  MRN: [id number] Date:  01/08/21  Timespent: 53 minutes    Treatment Type: Individual therapy   Mental Status Exam:    Appearance:   Casual, appropriate  Behavior:  WNL  Motor:  WNL  Speech/Language:   Clear and Coherent  Affect:  Full range  Mood:  Anxious, pleasant  Thought process:  normal  Thought content:   WNL, denies SI/HI  Sensory/Perceptual disturbances:   none  Orientation:  oriented to person, place and time/date  Attention:  Good  Concentration:  Good  Memory:  WNL  Fund of knowledge:   Good  Insight:   developing  Judgment:   good  Impulse Control:  good   Reported Symptoms: Depressed mood most days, anxiety, rumination, hx of mood swings, crying spells    Risk Assessment: Danger to Self: No Self-injurious Behavior:  Positive history, none in the past few months Danger to Others: No Duty to Warn:no Physical Aggression / Violence:No  Access to Firearms a concern: No  Gang Involvement:No  Patient / guardian was educated about steps to take if suicide or homicide risk level increases between visits. While future psychiatric events cannot be accurately predicted, the patient does not currently require acute inpatient psychiatric care and does not currently meet [facility] involuntary commitment criteria.  Subjective:   Patient presents for today's session on time.  She shared progress, recent events.  She shared how her father continues to cope with his cancer diagnosis and underwent a recent treatment where it was ineffective.  She stated that he plans to go to his next appointment to speak with his doctor to further understand his options.  She continues to express concern, assisting her in identifying feelings related to her father's health.  She shared how she is continued to have limited contact with some friends she is known for years,  she questions why they have not been more supportive as they have not reached out since she feels they are probably aware that he is coping with this diagnosis.  Facilitated her identifying areas with which she feels she has made positive changes and addressed some needs discussed in previous sessions.  She acknowledges that she is thriving at her job, sharing that she has been able to perform above and beyond the company's expectations, she is now able to take on other tasks due to her efficiency.  Facilitated her continuing to identify personal strengths as this is a struggle for her.  Provide support, understanding throughout giving her homework assignment related to identifying personal strengths, making this intentional between sessions.  Interventions: Cognitive Behavioral Therapy, supportive therapy, strength based approaches  Diagnosis:   ICD-10-CM   1. Bipolar I disorder ([place])  F31.9      Plan: Patient is to use CBT, mindfulness and coping skills to help manage decrease symptoms associated with their diagnosis. Patient to follow through with homework assignments given.   Long-term goal:   Reduce overall level, frequency, and intensity of the feelings of depression and anxiety 7/10 to a 0-2/10 in severity for at least 3 consecutive months.   Short-term goal:  Decrease "deflating, self-doubting" and catastrophizing thinking style Decrease anxiety producing self talk such as thinking of the worse possible life outcomes Decrease panic episodes by utilizing coping skills discussed in session.  Patient to utilize coping as discussed in session Patient to take medications as prescribed and report any concerns to her prescribing  provider   Assessment of progress:  progressing   Anson [place], [facility]

## 2021-01-22 DIAGNOSIS — F3181 Bipolar II disorder: Secondary | ICD-10-CM | POA: Diagnosis not present

## 2021-01-22 DIAGNOSIS — F418 Other specified anxiety disorders: Secondary | ICD-10-CM | POA: Diagnosis not present

## 2021-01-27 ENCOUNTER — Other Ambulatory Visit: Payer: Self-pay | Admitting: Adult Health

## 2021-01-27 ENCOUNTER — Telehealth: Payer: Self-pay | Admitting: Women's Health

## 2021-01-27 MED ORDER — NITROFURANTOIN MONOHYD MACRO 100 MG PO CAPS: 100.0000 mg | ORAL_CAPSULE | Freq: Two times a day (BID) | ORAL | 0 refills | Status: DC

## 2021-01-27 NOTE — Progress Notes (Signed)
Will rx macrobid  ?

## 2021-01-27 NOTE — Telephone Encounter (Signed)
Patient called stating that she is experiencing UTI symptoms and sh would like for a provider to call her in something. Please contact pt

## 2021-02-04 ENCOUNTER — Other Ambulatory Visit: Payer: Self-pay

## 2021-02-04 ENCOUNTER — Ambulatory Visit (INDEPENDENT_AMBULATORY_CARE_PROVIDER_SITE_OTHER): Payer: BC Managed Care – PPO | Admitting: Mental Health

## 2021-02-04 DIAGNOSIS — F319 Bipolar disorder, unspecified: Secondary | ICD-10-CM | POA: Diagnosis not present

## 2021-02-04 NOTE — Progress Notes (Signed)
[facility] Counselor/Therapist Progress Note   Patient ID: Paula Elliott,  MRN: [id number] Date:  01/08/21  Timespent: 53 minutes    Treatment Type: Individual therapy   Mental Status Exam:    Appearance:   Casual, appropriate  Behavior:  WNL  Motor:  WNL  Speech/Language:   Clear and Coherent  Affect:  Full range  Mood:  Anxious, pleasant  Thought process:  normal  Thought content:   WNL, denies SI/HI  Sensory/Perceptual disturbances:   none  Orientation:  oriented to person, place and time/date  Attention:  Good  Concentration:  Good  Memory:  WNL  Fund of knowledge:   Good  Insight:   developing  Judgment:   good  Impulse Control:  good   Reported Symptoms: Depressed mood most days, anxiety, rumination, hx of mood swings, crying spells    Risk Assessment: Danger to Self: No Self-injurious Behavior:  Positive history, none in the past few months Danger to Others: No Duty to Warn:no Physical Aggression / Violence:No  Access to Firearms a concern: No  Gang Involvement:No  Patient / guardian was educated about steps to take if suicide or homicide risk level increases between visits. While future psychiatric events cannot be accurately predicted, the patient does not currently require acute inpatient psychiatric care and does not currently meet [facility] involuntary commitment criteria.  Subjective:   Patient presents for today's session on time.  She continues to cope with stress related to her father's medical issues as he is receiving chemotherapy treatment for his advanced stage leukemia cancer diagnosis.  He stated that his doctor informed him that his chances of remission were minimal.  She stated her father plans to engage in inpatient treatment and if there is no progress toward recovery, he will withdraw from treatment to improve the quality of his life which will potentially consist of only months.  She went on to  process feelings related, how she is trying to spend more time with her father over the past month prior to his going back to the hospital.  She expressed concern about her mother if her father does pass, emotional toll on intake on her.  Patient was able to recognize allowing herself to identify and express her emotions which consists of ranges of anger and sadness are ways she is taking steps toward preparing herself for this potential loss.  She continues to have some support from family however, one of her friends has yet to reach out to her which has been upsetting.  She continues to thrive at her job, recently getting a pay raise for her performance.  Explored collaboratively ways to cope and care for herself, continue journaling maintaining a consistent sleep schedule which has been challenging recently due to waking multiple times at night.  She stated that she is not taking sleep medication as she wants was, and we reminded her that she can schedule for medication management at our office.   Interventions: Cognitive Behavioral Therapy, supportive therapy, strength based approaches  Diagnosis:   ICD-10-CM   1. Bipolar I disorder ([place])  F31.9      Plan: Patient is to use CBT, mindfulness and coping skills to help manage decrease symptoms associated with their diagnosis. Patient to follow through with homework assignments given.   Long-term goal:   Reduce overall level, frequency, and intensity of the feelings of depression and anxiety 7/10 to a 0-2/10 in severity for at least 3 consecutive months.   Short-term  goal:  Decrease "deflating, self-doubting" and catastrophizing thinking style Decrease anxiety producing self talk such as thinking of the worse possible life outcomes Decrease panic episodes by utilizing coping skills discussed in session.  Patient to utilize coping as discussed in session Patient to take medications as prescribed and report any concerns to her prescribing  provider   Assessment of progress:  progressing   Anson [place], [facility]

## 2021-02-23 ENCOUNTER — Ambulatory Visit (INDEPENDENT_AMBULATORY_CARE_PROVIDER_SITE_OTHER): Payer: BC Managed Care – PPO | Admitting: Mental Health

## 2021-02-23 DIAGNOSIS — F319 Bipolar disorder, unspecified: Secondary | ICD-10-CM | POA: Diagnosis not present

## 2021-02-23 NOTE — Progress Notes (Signed)
      [facility] Counselor/Therapist Progress Note   Patient ID: Paula Elliott,  MRN: [id number] Date:  02/23/21  Timespent: 53 minutes    Treatment Type: Individual therapy  Mental Status Exam:    Appearance:   Casual, appropriate  Behavior:  WNL  Motor:  WNL  Speech/Language:   Clear and Coherent  Affect:  Full range  Mood:  Anxious, pleasant  Thought process:  normal  Thought content:   WNL, denies SI/HI  Sensory/Perceptual disturbances:   none  Orientation:  oriented to person, place and time/date  Attention:  Good  Concentration:  Good  Memory:  WNL  Fund of knowledge:   Good  Insight:   developing  Judgment:   good  Impulse Control:  good   Reported Symptoms: Depressed mood most days, anxiety, rumination, hx of mood swings, crying spells    Risk Assessment: Danger to Self: No Self-injurious Behavior:  Positive history, none in the past few months Danger to Others: No Duty to Warn:no Physical Aggression / Violence:No  Access to Firearms a concern: No  Gang Involvement:No  Patient / guardian was educated about steps to take if suicide or homicide risk level increases between visits. While future psychiatric events cannot be accurately predicted, the patient does not currently require acute inpatient psychiatric care and does not currently meet [facility] involuntary commitment criteria.  Subjective:   Patient presents for today's session on time. She remains uncertain about her father's health. He continues to receive cancer treatment.  She confronted her mother about not talking more with her about how it is affecting her. She stated ended up talking opening w/ her mother- the fear of losing them both and being alone. She stated her mother listened to her and validated her feelings. She continues to be disappointed with her friends who have not contacted her re; her father's health.   She does have one friend she continues to  talk with and she will be her maid of honor in her coming wedding.  She identified the need to continue work on communicating w/ her mother more. She shared how she needs to work out again to improve energy levels.    Interventions: Cognitive Behavioral Therapy, supportive therapy, strength based approaches  Diagnosis:   ICD-10-CM   1. Bipolar I disorder ([place])  F31.9      Plan: Patient is to use CBT, mindfulness and coping skills to help manage decrease symptoms associated with their diagnosis. Patient to follow through with homework assignments given.   Long-term goal:   Reduce overall level, frequency, and intensity of the feelings of depression and anxiety in  severity for at least 3 consecutive months.   Short-term goal:  Decrease "deflating, self-doubting" and catastrophizing thinking style Decrease anxiety producing self talk such as thinking of the worse possible life outcomes Decrease panic episodes by utilizing coping skills discussed in session.  Patient to utilize coping as discussed in session Patient to take medications as prescribed and report any concerns to her prescribing provider   Assessment of progress:  progressing   Anson [place], [facility]

## 2021-03-02 DIAGNOSIS — F418 Other specified anxiety disorders: Secondary | ICD-10-CM | POA: Diagnosis not present

## 2021-03-02 DIAGNOSIS — F3181 Bipolar II disorder: Secondary | ICD-10-CM | POA: Diagnosis not present

## 2021-03-19 ENCOUNTER — Ambulatory Visit (INDEPENDENT_AMBULATORY_CARE_PROVIDER_SITE_OTHER): Payer: BC Managed Care – PPO | Admitting: Mental Health

## 2021-03-19 ENCOUNTER — Other Ambulatory Visit: Payer: Self-pay

## 2021-03-19 DIAGNOSIS — F319 Bipolar disorder, unspecified: Secondary | ICD-10-CM

## 2021-03-19 NOTE — Progress Notes (Signed)
[facility] Counselor/Therapist Progress Note   Patient ID: Paula Elliott,  MRN: [id number] Date:  03/19/21  Timespent: 54 minutes    Treatment Type: Individual therapy  Mental Status Exam:    Appearance:   Casual, appropriate  Behavior:  WNL  Motor:  WNL  Speech/Language:   Clear and Coherent  Affect:  Full range  Mood:  Anxious, pleasant  Thought process:  normal  Thought content:   WNL, denies SI/HI  Sensory/Perceptual disturbances:   none  Orientation:  oriented to person, place and time/date  Attention:  Good  Concentration:  Good  Memory:  WNL  Fund of knowledge:   Good  Insight:   developing  Judgment:   good  Impulse Control:  good   Reported Symptoms: Depressed mood most days, anxiety, rumination, hx of mood swings, crying spells    Risk Assessment: Danger to Self: No Self-injurious Behavior:  Positive history, none in the past few months Danger to Others: No Duty to Warn:no Physical Aggression / Violence:No  Access to Firearms a concern: No  Gang Involvement:No  Patient / guardian was educated about steps to take if suicide or homicide risk level increases between visits. While future psychiatric events cannot be accurately predicted, the patient does not currently require acute inpatient psychiatric care and does not currently meet [facility] involuntary commitment criteria.  Subjective:   Patient presents for today's session on time.  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  She shared some relational experiences at work, she has made some friendships and they have persisted throughout her employment.  She stated that her other personal friends over the last few years 1 of which is for several years, have not contacted her over the last 4 months this being particularly helpful  due to her father coping with cancer.  Provide support as she continued to detail aspects of his friendships and the hurtful feelings this is caused.  Facilitated patient identifying needs, self supportive thoughts where she identified not having expectations of these friends due to their lack of support while also not going out of her way to limit their ability to make contact; she identified this as a way to cope and care for herself.  Facilitated also patient identifying that the many positive changes she has continued to make for herself or she acknowledged her thriving at work, having improved relationship with her parents.      Interventions: Cognitive Behavioral Therapy, supportive therapy, strength based approaches  Diagnosis:   ICD-10-CM   1. Bipolar I disorder ([place])  F31.9      Plan: Patient is to use CBT, mindfulness and coping skills to help manage decrease symptoms associated with their diagnosis. Patient to follow through with homework assignments given.   Long-term goal:   Reduce overall level, frequency, and intensity of the feelings of depression and anxiety in  severity for at least 3 consecutive months.   Short-term goal:  Decrease "deflating, self-doubting" and catastrophizing thinking style Decrease anxiety producing self talk such as thinking of the worse possible life outcomes Decrease panic episodes by utilizing coping skills discussed in session.  Patient to utilize coping as discussed in session Patient to take medications as prescribed and report any concerns to her prescribing provider   Assessment of progress:  progressing   Anson [place], [facility]

## 2021-03-24 DIAGNOSIS — F3181 Bipolar II disorder: Secondary | ICD-10-CM | POA: Diagnosis not present

## 2021-03-24 DIAGNOSIS — F418 Other specified anxiety disorders: Secondary | ICD-10-CM | POA: Diagnosis not present

## 2021-04-14 DIAGNOSIS — F900 Attention-deficit hyperactivity disorder, predominantly inattentive type: Secondary | ICD-10-CM | POA: Diagnosis not present

## 2021-04-14 DIAGNOSIS — F3181 Bipolar II disorder: Secondary | ICD-10-CM | POA: Diagnosis not present

## 2021-04-14 DIAGNOSIS — F418 Other specified anxiety disorders: Secondary | ICD-10-CM | POA: Diagnosis not present

## 2021-04-20 ENCOUNTER — Ambulatory Visit (INDEPENDENT_AMBULATORY_CARE_PROVIDER_SITE_OTHER): Payer: BC Managed Care – PPO | Admitting: Mental Health

## 2021-04-20 ENCOUNTER — Other Ambulatory Visit: Payer: Self-pay

## 2021-04-20 DIAGNOSIS — F319 Bipolar disorder, unspecified: Secondary | ICD-10-CM | POA: Diagnosis not present
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
## 2021-05-05 ENCOUNTER — Encounter: Payer: Self-pay | Admitting: Adult Health

## 2021-05-05 ENCOUNTER — Ambulatory Visit: Payer: BC Managed Care – PPO | Admitting: Adult Health

## 2021-05-05 ENCOUNTER — Other Ambulatory Visit: Payer: Self-pay

## 2021-05-05 VITALS — BP 131/90 | HR 86 | Ht 64.0 in | Wt 160.0 lb

## 2021-05-05 DIAGNOSIS — N926 Irregular menstruation, unspecified: Secondary | ICD-10-CM | POA: Insufficient documentation

## 2021-05-05 DIAGNOSIS — Z7689 Persons encountering health services in other specified circumstances: Secondary | ICD-10-CM | POA: Diagnosis not present

## 2021-05-05 MED ORDER — LO LOESTRIN FE 1 MG-10 MCG / 10 MCG PO TABS: 1.0000 | ORAL_TABLET | Freq: Every day | ORAL | 0 refills | Status: DC

## 2021-05-05 NOTE — Progress Notes (Signed)
  Subjective:     Patient ID: Paula Elliott, female   DOB: [birth year], 30 y.o.   MRN: [id number]  HPI Paula Elliott is a 30 year old white female,single, G0P0, in complaining of bleeding for 3 weeks. She quit smoking and is vaping.  PCP is Hendricks Limes NP.  Review of Systems Bleeding for 3 weeks Has never had sex Has lost 60 lbs since November 2020 +stress Reviewed past medical,surgical, social and family history. Reviewed medications and allergies.     Objective:   Physical Exam BP 131/90 (BP Location: Right Arm, Patient Position: Sitting, Cuff Size: Normal)   Pulse 86   Ht 5\' 4"  (1.626 m)   Wt 160 lb (72.6 kg)   LMP 04/17/2021   BMI 27.46 kg/m  Skin warm and dry. Lungs: clear to ausculation bilaterally. Cardiovascular: regular rate and rhythm,+murmur     Upstream - 05/05/21 1524      Pregnancy Intention Screening   Does the patient want to become pregnant in the next year? No    Does the patient's partner want to become pregnant in the next year? No    Would the patient like to discuss contraceptive options today? Yes      Contraception Wrap Up   Current Method Oral Contraceptive    End Method Oral Contraceptive    Contraception Counseling Provided Yes           Assessment:     1. Irregular bleeding Stop current OCs Given 3 packs lo loestrin,start Sunday   2. Encounter for menstrual regulation Will try Lo loestrin    Plan:     Follow up in 3 months

## 2021-05-12 DIAGNOSIS — F3181 Bipolar II disorder: Secondary | ICD-10-CM | POA: Diagnosis not present

## 2021-05-12 DIAGNOSIS — F900 Attention-deficit hyperactivity disorder, predominantly inattentive type: Secondary | ICD-10-CM | POA: Diagnosis not present

## 2021-05-12 DIAGNOSIS — F418 Other specified anxiety disorders: Secondary | ICD-10-CM | POA: Diagnosis not present

## 2021-05-19 ENCOUNTER — Ambulatory Visit: Payer: BC Managed Care – PPO | Admitting: Mental Health

## 2021-06-22 ENCOUNTER — Ambulatory Visit (INDEPENDENT_AMBULATORY_CARE_PROVIDER_SITE_OTHER): Payer: BC Managed Care – PPO | Admitting: Mental Health

## 2021-06-22 DIAGNOSIS — F319 Bipolar disorder, unspecified: Secondary | ICD-10-CM

## 2021-06-22 NOTE — Progress Notes (Signed)
[facility] Counselor/Therapist Progress Note   Patient ID: Paula Elliott,  MRN: [id number] Date:   06/22/21  Timespent: 55 minutes    Treatment Type: Individual therapy  Virtual Visit via Telephone Note Connected with patient by telephone, with their informed consent, and verified patient privacy and that I am speaking with the correct person using two identifiers. I discussed the limitations, risks, security and privacy concerns of performing psychotherapy and management service by telephone and the availability of in person appointments. I also discussed with the patient that there may be a patient responsible charge related to this service. The patient expressed understanding and agreed to proceed. I discussed the treatment planning with the patient. The patient was provided an opportunity to ask questions and all were answered. The patient agreed with the plan and demonstrated an understanding of the instructions. The patient was advised to call  our office if  symptoms worsen or feel they are in a crisis state and need immediate contact.   Therapist Location: office Patient Location: home    Mental Status Exam:      Appearance:    Casual, appropriate  Behavior:   WNL  Motor:   WNL  Speech/Language:    Clear and Coherent  Affect:   Full range  Mood:   Anxious, pleasant  Thought process:   normal  Thought content:     WNL, denies SI/HI  Sensory/Perceptual disturbances:     none  Orientation:   oriented to person, place and time/date  Attention:   Good  Concentration:   Good  Memory:   WNL  Fund of knowledge:    Good  Insight:     developing  Judgment:    good  Impulse Control:   good    Reported Symptoms: Depressed mood most days, anxiety, rumination, hx of mood swings, crying spells    Risk Assessment: Danger to Self:  No Self-injurious Behavior:  Positive history, none in the past few months Danger to Others: No Duty to Warn:no Physical Aggression /  Violence:No  Access to Firearms a concern: No  Gang Involvement:No  Patient / guardian was educated about steps to take if suicide or homicide risk level increases between visits. While future psychiatric events cannot be accurately predicted, the patient does not currently require acute inpatient psychiatric care and does not currently meet [facility] involuntary commitment criteria.   Subjective:   Patient engaged in telehealth session via telephone.  Patient shared her father passed away last 27-Feb-2023.  Most of the session was centered around the loss of her father, she provided details and processed feelings related.  She stated the death was somewhat unexpected, knowing that he had cancer and was receiving treatment, while also expecting him to live longer based on recent progress.  While providing support, facilitated patient sharing how this loss has affected her recent functioning.  She says she has been able to sleep somewhat, but feels tired often.  Encouraged her to make attempts to remain as consistent as possible with her sleep schedule as well as her dietary habits.  She stated she has had a decreased appetite but still makes efforts to eat small meals and snacks.  Patient shared how she is lost about 100 pounds over the last year in an effort to improve her self image however, she states that she does not feel it is improved if anything possibly has gotten worse.  Patient stated she does not feel better as a result  of losing weight as she previously thought.  Discussed her diet with her and she plans to make a diet diary to become more aware of her caloric intake.  She says she has not engaged consistently in any exercise regimen but expressed being open to integrating this into her routine.  We discussed the importance of making attempts to eat sufficiently during this time as well as the importance of getting rest.  Allow herself to feel the emotions of this loss, journaling was  encouraged.  She plans to contact this office between sessions if needed as well as to seek earlier appointment as her next appointment is in 4 weeks.   Interventions:  supportive therapy, strength based approaches, motivational interviewing  Diagnosis:   ICD-10-CM   1. Bipolar I disorder ([place])  F31.9         Plan: Patient to make time to allow herself to grieve, journal related to the loss of her father.  Patient to follow through with ensuring she is getting enough rest and eating sufficiently during this time.   Long-term goal:   Reduce overall level, frequency, and intensity of the feelings of depression and anxiety in  severity for at least 3 consecutive months.   Short-term goal:  Decrease "deflating, self-doubting" and catastrophizing thinking style Decrease anxiety producing self talk such as thinking of the worse possible life outcomes Decrease panic episodes by utilizing coping skills discussed in session.  Patient to utilize coping as discussed in session Patient to take medications as prescribed and report any concerns to her prescribing provider   Assessment of progress:  progressing   Anson [place], [facility]

## 2021-07-07 DIAGNOSIS — F418 Other specified anxiety disorders: Secondary | ICD-10-CM | POA: Diagnosis not present

## 2021-07-07 DIAGNOSIS — F900 Attention-deficit hyperactivity disorder, predominantly inattentive type: Secondary | ICD-10-CM | POA: Diagnosis not present

## 2021-07-07 DIAGNOSIS — F3181 Bipolar II disorder: Secondary | ICD-10-CM | POA: Diagnosis not present

## 2021-07-20 ENCOUNTER — Ambulatory Visit: Payer: BC Managed Care – PPO | Admitting: Mental Health

## 2021-07-20 DIAGNOSIS — U071 COVID-19: Secondary | ICD-10-CM | POA: Diagnosis not present

## 2021-07-20 DIAGNOSIS — Z20822 Contact with and (suspected) exposure to covid-19: Secondary | ICD-10-CM | POA: Diagnosis not present

## 2021-07-21 ENCOUNTER — Other Ambulatory Visit: Payer: Self-pay

## 2021-07-21 ENCOUNTER — Ambulatory Visit (INDEPENDENT_AMBULATORY_CARE_PROVIDER_SITE_OTHER): Payer: BC Managed Care – PPO | Admitting: Mental Health

## 2021-07-21 DIAGNOSIS — F319 Bipolar disorder, unspecified: Secondary | ICD-10-CM

## 2021-07-21 NOTE — Progress Notes (Addendum)
[facility] Counselor/Therapist Progress Note   Patient ID: Paula Elliott,  MRN: [id number] Date:   07/21/21  Timespent: 50 minutes    Treatment Type: Individual therapy  Virtual Visit via Telephone Note Connected with patient by telephone, with their informed consent, and verified patient privacy and that I am speaking with the correct person using two identifiers. I discussed the limitations, risks, security and privacy concerns of performing psychotherapy and management service by telephone and the availability of in person appointments. I also discussed with the patient that there may be a patient responsible charge related to this service. The patient expressed understanding and agreed to proceed. I discussed the treatment planning with the patient. The patient was provided an opportunity to ask questions and all were answered. The patient agreed with the plan and demonstrated an understanding of the instructions. The patient was advised to call  our office if  symptoms worsen or feel they are in a crisis state and need immediate contact.   Therapist Location: office Patient Location: home    Mental Status Exam:      Appearance:    Casual, appropriate  Behavior:   WNL  Motor:   WNL  Speech/Language:    Clear and Coherent  Affect:   Full range  Mood:   Anxious, pleasant  Thought process:   normal  Thought content:     WNL, denies SI/HI  Sensory/Perceptual disturbances:     none  Orientation:   oriented to person, place and time/date  Attention:   Good  Concentration:   Good  Memory:   WNL  Fund of knowledge:    Good  Insight:     developing  Judgment:    good  Impulse Control:   good    Reported Symptoms: Depressed mood most days, anxiety, rumination, hx of mood swings, crying spells    Risk Assessment: Danger to Self:  No Self-injurious Behavior:  Positive history, none in the past few months Danger to Others: No Duty to Warn:no Physical Aggression /  Violence:No  Access to Firearms a concern: No  Gang Involvement:No  Patient / guardian was educated about steps to take if suicide or homicide risk level increases between visits. While future psychiatric events cannot be accurately predicted, the patient does not currently require acute inpatient psychiatric care and does not currently meet [facility] involuntary commitment criteria.   Subjective:   Patient engaged in telehealth session via telephone. She stated she contracted Covid 19 recently, continues to feel fatigued. "I cant get a break". She stated she is dating a guy recently.  Reports higher anxiety, "I think I'm self sabatoging, waiting for the other shoe to drop". She stated she immediately goes to "he going to ghost me", to ready herself for the possible let down of it not working out.  I pick apart everything, worrying what could go wrong. I dont see weight loss, I dont see change- I pick myself apart".  She stated she followed through with her calorie counting application on her time to get a better understanding of her food intake.  She has lost a considerable amount of weight per her report, continues to struggle with self validation.  Facilitated her identifying self supportive thoughts toward the considerable progress she has made.  She admitted that she does not put forth the effort to take moments to recognize her positive qualities, positive changes she has made for herself.  Encouraged her to do so between sessions.  She plans also integrate exercise into her regimen as a way to cope and care for herself.     Interventions:  supportive therapy, strength based approaches, motivational interviewing  Diagnosis:   ICD-10-CM   1. Bipolar I disorder ([place])  F31.9          Plan: Patient to make time to allow herself to grieve, journal related to the loss of her father.  Patient to follow through with ensuring she is getting enough rest and eating sufficiently during this  time.   Long-term goal:   Reduce overall level, frequency, and intensity of the feelings of depression and anxiety in  severity for at least 3 consecutive months.   Short-term goal:  Decrease "deflating, self-doubting" and catastrophizing thinking style Decrease anxiety producing self talk such as thinking of the worse possible life outcomes Decrease panic episodes by utilizing coping skills discussed in session.  Patient to utilize coping as discussed in session Patient to take medications as prescribed and report any concerns to her prescribing provider   Assessment of progress:  progressing   Anson [place], [facility]
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
## 2021-08-04 DIAGNOSIS — F900 Attention-deficit hyperactivity disorder, predominantly inattentive type: Secondary | ICD-10-CM | POA: Diagnosis not present

## 2021-08-04 DIAGNOSIS — F418 Other specified anxiety disorders: Secondary | ICD-10-CM | POA: Diagnosis not present

## 2021-08-04 DIAGNOSIS — F3181 Bipolar II disorder: Secondary | ICD-10-CM | POA: Diagnosis not present

## 2021-08-05 ENCOUNTER — Ambulatory Visit: Payer: BC Managed Care – PPO | Admitting: Adult Health

## 2021-08-05 ENCOUNTER — Encounter: Payer: Self-pay | Admitting: Adult Health

## 2021-08-05 ENCOUNTER — Other Ambulatory Visit: Payer: Self-pay

## 2021-08-05 VITALS — BP 120/77 | HR 90 | Ht 64.0 in | Wt 139.4 lb

## 2021-08-05 DIAGNOSIS — Z7689 Persons encountering health services in other specified circumstances: Secondary | ICD-10-CM | POA: Diagnosis not present

## 2021-08-05 MED ORDER — LO LOESTRIN FE 1 MG-10 MCG / 10 MCG PO TABS: 1.0000 | ORAL_TABLET | Freq: Every day | ORAL | 11 refills | Status: DC

## 2021-08-05 NOTE — Progress Notes (Signed)
  Subjective:     Patient ID: Paula Elliott, female   DOB: 04-23-1991, 30 y.o.   MRN: [id number]  HPI Paula Elliott is a 30 year old white female,single, G0P0, back in follow up on starting lo Loestrin for period control and is happy, not having a period.  Lab Results  Component Value Date   DIAGPAP (A) 07/03/2020    - Atypical squamous cells of undetermined significance (ASC-US)   HPVHIGH Negative 07/03/2020    Review of Systems No period on lo Loestrin, happy Not having sex Reviewed past medical,surgical, social and family history. Reviewed medications and allergies.     Objective:   Physical Exam BP 120/77 (BP Location: Right Arm, Patient Position: Sitting, Cuff Size: Normal)   Pulse 90   Ht '5\' 4"'$  (1.626 m)   Wt 139 lb 6.4 oz (63.2 kg)   BMI 23.93 kg/m  Skin warm and dry.  Lungs: clear to ausculation bilaterally. Cardiovascular: regular rate and rhythm. +murmur  Upstream - 08/05/21 0929       Pregnancy Intention Screening   Does the patient want to become pregnant in the next year? No    Does the patient's partner want to become pregnant in the next year? No    Would the patient like to discuss contraceptive options today? No      Contraception Wrap Up   Current Method Oral Contraceptive    End Method Oral Contraceptive    Contraception Counseling Provided No                Assessment:     1. Encounter for menstrual regulation Continue Lo Loestrin, 3 packs and discount card given Meds ordered this encounter  Medications   Norethindrone-Ethinyl Estradiol-Fe Biphas (LO LOESTRIN FE) 1 MG-10 MCG / 10 MCG tablet    Sig: Take 1 tablet by mouth daily. Take 1 daily by mouth    Dispense:  28 tablet    Refill:  11    BIN [id number], PCN [id number], GRP [id number]    Order Specific Question:   Supervising Provider    Answer:   Florian Buff [[id number]]       Plan:     Return in 1 year for physical or sooner if needed

## 2021-08-11 ENCOUNTER — Other Ambulatory Visit: Payer: Self-pay

## 2021-08-11 ENCOUNTER — Ambulatory Visit (INDEPENDENT_AMBULATORY_CARE_PROVIDER_SITE_OTHER): Payer: BC Managed Care – PPO | Admitting: Mental Health

## 2021-08-11 DIAGNOSIS — F319 Bipolar disorder, unspecified: Secondary | ICD-10-CM

## 2021-08-11 NOTE — Progress Notes (Signed)
[facility] Counselor/Therapist Progress Note   Patient ID: Paula Elliott,  MRN: [id number] Date:   08/11/21  Timespent: 55 minutes    Treatment Type: Individual therapy    Mental Status Exam:      Appearance:    Casual, appropriate  Behavior:   WNL  Motor:   WNL  Speech/Language:    Clear and Coherent  Affect:   Full range  Mood:   Anxious, pleasant  Thought process:   normal  Thought content:     WNL, denies SI/HI  Sensory/Perceptual disturbances:     none  Orientation:   oriented to person, place and time/date  Attention:   Good  Concentration:   Good  Memory:   WNL  Fund of knowledge:    Good  Insight:     developing  Judgment:    good  Impulse Control:   good    Reported Symptoms: Depressed mood most days, anxiety, rumination, hx of mood swings, crying spells    Risk Assessment: Danger to Self:  No Self-injurious Behavior:  Positive history, none in the past few months Danger to Others: No Duty to Warn:no Physical Aggression / Violence:No  Access to Firearms a concern: No  Gang Involvement:No  Patient / guardian was educated about steps to take if suicide or homicide risk level increases between visits. While future psychiatric events cannot be accurately predicted, the patient does not currently require acute inpatient psychiatric care and does not currently meet [facility] involuntary commitment criteria.   Subjective:   Patient engaged in telehealth session. Patient shared progress, recent events. She shared how she continues to cope with the loss of her father he passed about 2 months ago. How her mother is coping and how patient intervene specifically, encouraging her to engage in some therapy due to ongoing grief and subsequent feelings of depression. She said her mother plans to follow through and re-engage in therapy. Reinforced within patient when she was effective in communicating her feelings, concerns directly with her mother. Patient  presents also with a significant loss and weight, states that she is eating sufficiently sharing some details of her dietary intake. She continues to cope with body image issuees where she shared thoughts and feelings related. She shared how she was talking to a young man over the past 1 to 2 months however, recently he discontinued communication with her. Facilitate her identifying self supportive thoughts related as she was able to consider aspects of the relationship that were problematic however, her being unable to fully recognize due to the relationship starting around the time the passing of her father. Her plan is to continue her healthy diet and in a great exercise into her routine with which she has struggled to follow through with admittedly over the past month. Due to the significant life adjustments with which she has had to cope, provided support and understanding while also collaboratively discussing ways to get started.     Interventions:  supportive therapy, strength based approaches, motivational interviewing  Diagnosis:   ICD-10-CM   1. Bipolar I disorder ([place])  F31.9           Plan:   Patient to follow through with exercise regimen.  Patient to continue to be mindful of her dietary intake, ensuring that she is getting adequate nutrition.  Patient continued to journal as needed related to life stressors and the continual outlet needed related to the loss of her father.  Long-term goal:  Reduce overall level, frequency, and intensity of the feelings of depression and anxiety in  severity for at least 3 consecutive months.   Short-term goal:  Decrease "deflating, self-doubting" and catastrophizing thinking style Decrease anxiety producing self talk such as thinking of the worse possible life outcomes Decrease panic episodes by utilizing coping skills discussed in session.  Patient to utilize coping as discussed in session Patient to take medications as prescribed and report  any concerns to her prescribing provider   Assessment of progress:  progressing   Anson [place], [facility]

## 2021-09-21 ENCOUNTER — Ambulatory Visit (INDEPENDENT_AMBULATORY_CARE_PROVIDER_SITE_OTHER): Payer: BC Managed Care – PPO | Admitting: Mental Health

## 2021-09-21 ENCOUNTER — Other Ambulatory Visit: Payer: Self-pay

## 2021-09-21 DIAGNOSIS — F3181 Bipolar II disorder: Secondary | ICD-10-CM

## 2021-09-21 NOTE — Progress Notes (Signed)
[facility] Counselor/Therapist Progress Note   Patient ID: Paula Elliott,  MRN: [id number] Date:   09/21/21  Timespent: 58 minutes    Treatment Type: Individual therapy    Mental Status Exam:      Appearance:    Casual, appropriate  Behavior:   WNL  Motor:   WNL  Speech/Language:    Clear and Coherent  Affect:   Full range  Mood:   Anxious, pleasant  Thought process:   normal  Thought content:     WNL, denies SI/HI  Sensory/Perceptual disturbances:     none  Orientation:   oriented to person, place and time/date  Attention:   Good  Concentration:   Good  Memory:   WNL  Fund of knowledge:    Good  Insight:     developing  Judgment:    good  Impulse Control:   good    Reported Symptoms: Depressed mood most days, anxiety, rumination, hx of mood swings, crying spells    Risk Assessment: Danger to Self:  No Self-injurious Behavior:  Positive history, none in the past few months Danger to Others: No Duty to Warn:no Physical Aggression / Violence:No  Access to Firearms a concern: No  Gang Involvement:No  Patient / guardian was educated about steps to take if suicide or homicide risk level increases between visits. While future psychiatric events cannot be accurately predicted, the patient does not currently require acute inpatient psychiatric care and does not currently meet [facility] involuntary commitment criteria.   Subjective:   Patient engaged in telehealth session.  Patient shared progress.  She stated that she and her mother are now attending a grief group; stated it has been helpful in some aspects.  Family relationships were assessed, states that she and her mother continue to grieve the loss of her father.  Shared some recent communication she has had with her mother and attempts to set some boundaries while also patient identifying the need of having more support from her mother emotionally during this time.  This continues to be a theme in  their relationship now also present as she moves through the grief process.  She continues to work on being able to "say no", going on to share some examples with her cousin and how she plans to continue to work on setting this boundary that she identified needing for herself.  Some sad and hurt feelings related to one of her closer friends not coming to her home for her birthday. She continues to work her current full-time job, continues to identify the need to move out of her parent's home within the next several months but recognizes needing a different job or more income to do so.  She continues to thrive at her current job however, does not want to accept a new job opening there unless more money is offered.    Interventions:  supportive therapy, strength based approaches, motivational interviewing  Diagnosis:   ICD-10-CM   1. Bipolar II disorder ([place])  F31.81            Plan:   Patient to follow through with exercise regimen.  Patient to continue to be mindful of her dietary intake, ensuring that she is getting adequate nutrition.  Patient continued to journal as needed related to life stressors and the continual outlet needed related to the loss of her father.  Patient to continue to work on setting boundaries in relationships.  Long-term goal:   Reduce overall level,  frequency, and intensity of the feelings of depression and anxiety in  severity for at least 3 consecutive months.   Short-term goal:  Decrease "deflating, self-doubting" and catastrophizing thinking style Decrease anxiety producing self talk such as thinking of the worse possible life outcomes Decrease panic episodes by utilizing coping skills discussed in session.  Patient to utilize coping as discussed in session, setting boundaries and relationship as needed, "saying no" as needed. Patient to take medications as prescribed and report any concerns to her prescribing provider   Assessment of progress:   progressing   Anson [place], [facility]

## 2021-09-29 DIAGNOSIS — F418 Other specified anxiety disorders: Secondary | ICD-10-CM | POA: Diagnosis not present

## 2021-09-29 DIAGNOSIS — F900 Attention-deficit hyperactivity disorder, predominantly inattentive type: Secondary | ICD-10-CM | POA: Diagnosis not present

## 2021-09-29 DIAGNOSIS — F3181 Bipolar II disorder: Secondary | ICD-10-CM | POA: Diagnosis not present

## 2021-10-09 ENCOUNTER — Telehealth: Payer: Self-pay | Admitting: Adult Health

## 2021-10-09 NOTE — Telephone Encounter (Signed)
Patient called stating that she received a bill for DOS: 08/05/2021 and she called [employer] to see why she had this bill so high and they informed her that they was it was coded was the reason why. They told her if the provider can change the way it was coded it could change the bill. I informed the patient that once the provider has coded the DOS there is no way they will be able to change the code. Patient wanted me to ask the provider, please contact the patient.

## 2021-10-09 NOTE — Telephone Encounter (Signed)
Pt aware it was coded [id number], which was right for that visit.

## 2021-10-14 ENCOUNTER — Other Ambulatory Visit: Payer: Self-pay

## 2021-10-14 ENCOUNTER — Ambulatory Visit (INDEPENDENT_AMBULATORY_CARE_PROVIDER_SITE_OTHER): Payer: BC Managed Care – PPO | Admitting: Mental Health

## 2021-10-14 DIAGNOSIS — F3181 Bipolar II disorder: Secondary | ICD-10-CM | POA: Diagnosis not present

## 2021-10-14 NOTE — Progress Notes (Addendum)
[facility] Counselor/Therapist Progress Note   Patient ID: Paula Elliott,  MRN: [id number] Date:   10/14/21  Timespent: 55 minutes    Treatment Type: Individual therapy    Mental Status Exam:      Appearance:    Casual, appropriate  Behavior:   WNL  Motor:   WNL  Speech/Language:    Clear and Coherent  Affect:   Full range  Mood:   Anxious, pleasant  Thought process:   normal  Thought content:     WNL, denies SI/HI  Sensory/Perceptual disturbances:     none  Orientation:   oriented to person, place and time/date  Attention:   Good  Concentration:   Good  Memory:   WNL  Fund of knowledge:    Good  Insight:     developing  Judgment:    good  Impulse Control:   good    Reported Symptoms: Depressed mood most days, anxiety, rumination, hx of mood swings, crying spells    Risk Assessment: Danger to Self:  No Self-injurious Behavior:  Positive history, none in the past few months Danger to Others: No Duty to Warn:no Physical Aggression / Violence:No  Access to Firearms a concern: No  Gang Involvement:No  Patient / guardian was educated about steps to take if suicide or homicide risk level increases between visits. While future psychiatric events cannot be accurately predicted, the patient does not currently require acute inpatient psychiatric care and does not currently meet [facility] involuntary commitment criteria.   Subjective:   Patient presents for session on time.  Patient shared progress. Patient shared how she has made some recent changes. She said she moved back into her bedroom from the den where she was sleeping with her mother for the past few months since her father had passed. Patient stated that she is having sleep difficulties, waking throughout the night. She stayed that she continues to experience an ongoing reduced distress tolerance, states that  "small issues " upset her such as her spelling her coffee or making a mistake or error in  some other way. We explore ways to cope, reviewed thought stopping. Sleep hygiene was discussed with an emphasis on consistency with her routines.  She stated that she and her mother are now attending a grief group     Interventions:  supportive therapy, strength based approaches, motivational interviewing  Diagnosis:   ICD-10-CM   1. Bipolar II disorder ([place])  F31.81             Plan:   Patient to follow through with exercise regimen.  Patient to continue to be mindful of her dietary intake, ensuring that she is getting adequate nutrition.  Patient continued to journal as needed related to life stressors and the continual outlet needed related to the loss of her father.  Patient to continue to work on setting boundaries in relationships.  Long-term goal:   Reduce overall level, frequency, and intensity of the feelings of depression and anxiety in  severity for at least 3 consecutive months.   Short-term goal:  Decrease "deflating, self-doubting" and catastrophizing thinking style Decrease anxiety producing self talk such as thinking of the worse possible life outcomes Decrease panic episodes by utilizing coping skills discussed in session.  Patient to utilize coping as discussed in session, setting boundaries and relationship as needed, "saying no" as needed. Patient to take medications as prescribed and report any concerns to her prescribing provider   Assessment of progress:  progressing   Anson [place], [facility]

## 2021-10-27 DIAGNOSIS — F418 Other specified anxiety disorders: Secondary | ICD-10-CM | POA: Diagnosis not present

## 2021-10-27 DIAGNOSIS — F3181 Bipolar II disorder: Secondary | ICD-10-CM | POA: Diagnosis not present

## 2021-10-27 DIAGNOSIS — F900 Attention-deficit hyperactivity disorder, predominantly inattentive type: Secondary | ICD-10-CM | POA: Diagnosis not present

## 2021-10-28 ENCOUNTER — Other Ambulatory Visit: Payer: Self-pay

## 2021-10-28 ENCOUNTER — Ambulatory Visit (INDEPENDENT_AMBULATORY_CARE_PROVIDER_SITE_OTHER): Payer: BC Managed Care – PPO | Admitting: Mental Health

## 2021-10-28 DIAGNOSIS — F3181 Bipolar II disorder: Secondary | ICD-10-CM

## 2021-10-28 NOTE — Progress Notes (Signed)
[facility] Counselor/Therapist Progress Note   Patient ID: Paula Elliott,  MRN: [id number] Date:   11/12/21  Timespent: 54 minutes    Treatment Type: Individual therapy    Mental Status Exam:      Appearance:    Casual, appropriate  Behavior:   WNL  Motor:   WNL  Speech/Language:    Clear and Coherent  Affect:   Full range  Mood:   Anxious, pleasant  Thought process:   normal  Thought content:     WNL, denies SI/HI  Sensory/Perceptual disturbances:     none  Orientation:   oriented to person, place and time/date  Attention:   Good  Concentration:   Good  Memory:   WNL  Fund of knowledge:    Good  Insight:     developing  Judgment:    good  Impulse Control:   good    Reported Symptoms: Depressed mood most days, anxiety, rumination, hx of mood swings, crying spells    Risk Assessment: Danger to Self:  No Self-injurious Behavior:  Positive history, none in the past few months Danger to Others: No Duty to Warn:no Physical Aggression / Violence:No  Access to Firearms a concern: No  Gang Involvement:No  Patient / guardian was educated about steps to take if suicide or homicide risk level increases between visits. While future psychiatric events cannot be accurately predicted, the patient does not currently require acute inpatient psychiatric care and does not currently meet [facility] involuntary commitment criteria.   Subjective:   Patient engaged in telehealth session.  Patient shared progress.  Continues to have work-related stress, is being assigned multiple duties beyond typical, is considering looking for other job options.  Continues to express wanting to have more independence, possible move out of her parent's home and find her own residence but expresses concern due to not making enough money to take needed steps at this point.  Although patient has lost a considerable amount of weight and she is pleased with this outcome, she continues to  identify and share several thoughts related to being dissatisfied with her parents, continues to be highly self-critical.  Patient acknowledges that she has not taken the time to engage in exercise which she agreed might be helpful in terms of health and wellness as well as potentially improving her self confidence.  She continues to sleep in her bedroom, not in the den where her mother continues to sleep which is related to the passing of her father; they both continue to grieve this loss.      She stated that she and her mother are now attending a grief group     Interventions:  supportive therapy, strength based approaches, motivational interviewing  Diagnosis:   ICD-10-CM   1. Bipolar II disorder ([place])  F31.81              Plan:   Patient to follow through with exercise regimen.  Patient to continue to be mindful of her dietary intake, ensuring that she is getting adequate nutrition.  Patient continued to journal as needed related to life stressors and the continual outlet needed related to the loss of her father.  Patient to continue to work on setting boundaries in relationships.  Long-term goal:   Reduce overall level, frequency, and intensity of the feelings of depression and anxiety in  severity for at least 3 consecutive months.   Short-term goal:  Decrease "deflating, self-doubting" and catastrophizing thinking style Decrease anxiety  producing self talk such as thinking of the worse possible life outcomes Decrease panic episodes by utilizing coping skills discussed in session.  Patient to utilize coping as discussed in session, setting boundaries and relationship as needed, "saying no" as needed. Patient to take medications as prescribed and report any concerns to her prescribing provider   Assessment of progress:  progressing   Anson [place], [facility]

## 2021-11-12 ENCOUNTER — Other Ambulatory Visit: Payer: Self-pay

## 2021-11-12 ENCOUNTER — Ambulatory Visit (INDEPENDENT_AMBULATORY_CARE_PROVIDER_SITE_OTHER): Payer: BC Managed Care – PPO | Admitting: Mental Health

## 2021-11-12 DIAGNOSIS — F3181 Bipolar II disorder: Secondary | ICD-10-CM | POA: Diagnosis not present

## 2021-11-12 NOTE — Progress Notes (Signed)
[facility] Counselor/Therapist Progress Note   Patient ID: Paula Elliott,  MRN: [id number] Date:   11/12/21  Timespent: 54 minutes    Treatment Type: Individual therapy    Mental Status Exam:      Appearance:    Casual, appropriate  Behavior:   WNL  Motor:   WNL  Speech/Language:    Clear and Coherent  Affect:   Full range  Mood:   Anxious, pleasant  Thought process:   normal  Thought content:     WNL, denies SI/HI  Sensory/Perceptual disturbances:     none  Orientation:   oriented to person, place and time/date  Attention:   Good  Concentration:   Good  Memory:   WNL  Fund of knowledge:    Good  Insight:     developing  Judgment:    good  Impulse Control:   good    Reported Symptoms: Depressed mood most days, anxiety, rumination, hx of mood swings, crying spells    Risk Assessment: Danger to Self:  No Self-injurious Behavior:  Positive history, none in the past few months Danger to Others: No Duty to Warn:no Physical Aggression / Violence:No  Access to Firearms a concern: No  Gang Involvement:No  Patient / guardian was educated about steps to take if suicide or homicide risk level increases between visits. While future psychiatric events cannot be accurately predicted, the patient does not currently require acute inpatient psychiatric care and does not currently meet [facility] involuntary commitment criteria.   Subjective:   Patient presents on time for today's session.  She identified ongoing work-related stress, continuing to feel less content about her current job.  She recognizes the benefits of her current job however, she knows that she is not making enough money to sustain herself toward achieving independence such as having her own apartment.  Patient continues to live with her mother and understands that with her current job, this is a requirement due to her income.  At this point, she does not say financially sound future toward gaining  this needed independence by staying at her current job.  She plans to look for other opportunities in the coming year as she continues to express needing change for this and other reasons discussed in session.  She identifies also the continued dissatisfaction with herself physically as she identified "I just do not like my body".  Patient has lost a considerable amount of weight, in which she espouses some pride in doing so, however, continues to express dissatisfaction about her appearance while also continuing to acknowledge that she has not taken steps as discussed in previous sessions toward an exercise routine which may potentially assist her in gaining more confidence as she acknowledged in previous sessions as well.  Collaboratively, we further discussed ways to potentially take steps in these areas as well as encouraged her to continue to journal toward further self understanding and growth.     Interventions:  supportive therapy, strength based approaches, motivational interviewing  Diagnosis:   ICD-10-CM   1. Bipolar II disorder ([place])  F31.81               Plan:   Patient to follow through with exercise regimen.  Patient to continue to be mindful of her dietary intake, ensuring that she is getting adequate nutrition.  Patient continued to journal as needed related to life stressors and the continual outlet needed related to the loss of her father.  Patient to continue  to work on setting boundaries in relationships.  Long-term goal:   Reduce overall level, frequency, and intensity of the feelings of depression and anxiety in  severity for at least 3 consecutive months.   Short-term goal:  Decrease "deflating, self-doubting" and catastrophizing thinking style Decrease anxiety producing self talk such as thinking of the worse possible life outcomes Decrease panic episodes by utilizing coping skills discussed in session.  Patient to utilize coping as discussed in session, setting  boundaries and relationship as needed, "saying no" as needed. Patient to take medications as prescribed and report any concerns to her prescribing provider   Assessment of progress:  progressing   Anson [place], [facility]

## 2021-11-13 DIAGNOSIS — F900 Attention-deficit hyperactivity disorder, predominantly inattentive type: Secondary | ICD-10-CM | POA: Diagnosis not present

## 2021-11-13 DIAGNOSIS — F418 Other specified anxiety disorders: Secondary | ICD-10-CM | POA: Diagnosis not present

## 2021-11-13 DIAGNOSIS — F3181 Bipolar II disorder: Secondary | ICD-10-CM | POA: Diagnosis not present

## 2021-11-24 DIAGNOSIS — H40033 Anatomical narrow angle, bilateral: Secondary | ICD-10-CM | POA: Diagnosis not present

## 2021-11-24 DIAGNOSIS — H04123 Dry eye syndrome of bilateral lacrimal glands: Secondary | ICD-10-CM | POA: Diagnosis not present

## 2021-11-26 ENCOUNTER — Ambulatory Visit: Payer: BC Managed Care – PPO | Admitting: Mental Health

## 2021-11-27 DIAGNOSIS — F418 Other specified anxiety disorders: Secondary | ICD-10-CM | POA: Diagnosis not present

## 2021-11-27 DIAGNOSIS — F3181 Bipolar II disorder: Secondary | ICD-10-CM | POA: Diagnosis not present

## 2021-11-27 DIAGNOSIS — F900 Attention-deficit hyperactivity disorder, predominantly inattentive type: Secondary | ICD-10-CM | POA: Diagnosis not present

## 2021-12-25 DIAGNOSIS — F3181 Bipolar II disorder: Secondary | ICD-10-CM | POA: Diagnosis not present

## 2021-12-25 DIAGNOSIS — F900 Attention-deficit hyperactivity disorder, predominantly inattentive type: Secondary | ICD-10-CM | POA: Diagnosis not present

## 2021-12-25 DIAGNOSIS — F418 Other specified anxiety disorders: Secondary | ICD-10-CM | POA: Diagnosis not present

## 2022-01-07 ENCOUNTER — Ambulatory Visit: Payer: BC Managed Care – PPO | Admitting: Mental Health

## 2022-01-21 ENCOUNTER — Ambulatory Visit (INDEPENDENT_AMBULATORY_CARE_PROVIDER_SITE_OTHER): Payer: BC Managed Care – PPO | Admitting: Mental Health

## 2022-01-21 ENCOUNTER — Other Ambulatory Visit: Payer: Self-pay

## 2022-01-21 DIAGNOSIS — F3181 Bipolar II disorder: Secondary | ICD-10-CM | POA: Diagnosis not present

## 2022-01-21 NOTE — Progress Notes (Signed)
[facility] Counselor/Therapist Progress Note   Patient ID: Paula Elliott,  MRN: [id number] Date:   01/21/21  Timespent: 59 minutes    Treatment Type: Individual therapy    Mental Status Exam:      Appearance:    Casual, appropriate  Behavior:   WNL  Motor:   WNL  Speech/Language:    Clear and Coherent  Affect:   Full range  Mood:   Anxious, pleasant  Thought process:   normal  Thought content:     WNL, denies SI/HI  Sensory/Perceptual disturbances:     none  Orientation:   oriented to person, place and time/date  Attention:   Good  Concentration:   Good  Memory:   WNL  Fund of knowledge:    Good  Insight:     developing  Judgment:    good  Impulse Control:   good    Reported Symptoms: Depressed mood most days, anxiety, rumination, hx of mood swings, crying spells    Risk Assessment: Danger to Self:  No Self-injurious Behavior:  Positive history, none in the past year  Danger to Others: No Duty to Warn:no Physical Aggression / Violence:No  Access to Firearms a concern: No  Gang Involvement:No  Patient / guardian was educated about steps to take if suicide or homicide risk level increases between visits. While future psychiatric events cannot be accurately predicted, the patient does not currently require acute inpatient psychiatric care and does not currently meet [facility] involuntary commitment criteria.   Subjective:   Patient presents on time for today's session.  Patient shared progress, events since last session which was about 3 months ago.  She stated she continues to work at her current job and expresses dissatisfaction as she is overworked and is not receiving enough income from her job to gain independence with which she desires.  She stated she applied for different job recently with a higher income however, she stated that there were concerns about the work conditions and duties therefore she declined the position.  She plans to continue  to look for other job possibilities as she does not feel valued at her current position.  She went on to share how had an emotional breakdown early last month, given contributing factors related to work stress and feeling that she was allowing herself to finally grieve the loss of her father.  Her focus has been her mother for several months following the loss of her father.  She went on to share how her mother has been improving with her daily functioning, getting out of the house more etc. since the loss.  She shared more history related to her family, past issues between her parents relationally that she has recalled more recently due to her mother talking to a man that she considers a friend many years ago.  Provide support throughout, continuing to utilize motivational interviewing to assist patient in identifying needs as well as validation for some feelings identified today in session.    Interventions:  supportive therapy, strength based approaches, motivational interviewing  Diagnosis:   ICD-10-CM   1. Bipolar II disorder ([place])  F31.81          Plan:   Patient to follow through with exercise regimen.  Patient to continue to be mindful of her dietary intake, ensuring that she is getting adequate nutrition.  Patient continued to journal as needed related to life stressors and the continual outlet needed related to the loss of  her father.  Patient to continue to work on setting boundaries in relationships.  Long-term goal:   Reduce overall level, frequency, and intensity of the feelings of depression and anxiety in  severity for at least 3 consecutive months.   Short-term goal:  Decrease "deflating, self-doubting" and catastrophizing thinking style Decrease anxiety producing self talk such as thinking of the worse possible life outcomes Decrease panic episodes by utilizing coping skills discussed in session.  Patient to utilize coping as discussed in session, setting boundaries and  relationship as needed, "saying no" as needed. Patient to take medications as prescribed and report any concerns to her prescribing provider   Assessment of progress:  progressing   Anson [place], [facility]

## 2022-02-04 ENCOUNTER — Other Ambulatory Visit: Payer: Self-pay

## 2022-02-04 ENCOUNTER — Encounter: Payer: Self-pay | Admitting: Adult Health

## 2022-02-04 ENCOUNTER — Ambulatory Visit: Payer: BC Managed Care – PPO | Admitting: Mental Health

## 2022-02-04 ENCOUNTER — Ambulatory Visit: Payer: BC Managed Care – PPO | Admitting: Adult Health

## 2022-02-04 ENCOUNTER — Other Ambulatory Visit: Payer: Self-pay | Admitting: Adult Health

## 2022-02-04 VITALS — BP 116/84 | HR 111 | Ht 64.0 in | Wt 126.0 lb

## 2022-02-04 DIAGNOSIS — F3181 Bipolar II disorder: Secondary | ICD-10-CM | POA: Diagnosis not present

## 2022-02-04 DIAGNOSIS — R1905 Periumbilic swelling, mass or lump: Secondary | ICD-10-CM | POA: Diagnosis not present

## 2022-02-04 DIAGNOSIS — K5900 Constipation, unspecified: Secondary | ICD-10-CM | POA: Diagnosis not present

## 2022-02-04 MED ORDER — LINACLOTIDE 290 MCG PO CAPS: 290.0000 ug | ORAL_CAPSULE | Freq: Every day | ORAL | 6 refills | Status: DC

## 2022-02-04 NOTE — Progress Notes (Signed)
?    [facility] Counselor/Therapist Progress Note ? ? ?Patient ID: [name],  ?MRN: [id number] ?Date:   02/04/21 ? ?Timespent: 57 minutes   ? ?Treatment Type: Individual therapy ?  ? ?Mental Status Exam: ?  ?   ?Appearance:    Casual, appropriate  ?Behavior:   WNL  ?Motor:   WNL  ?Speech/Language:    Clear and Coherent  ?Affect:   Full range  ?Mood:   Anxious, pleasant  ?Thought process:   normal  ?Thought content:     WNL, denies SI/HI  ?Sensory/Perceptual disturbances:     none  ?Orientation:   oriented to person, place and time/date  ?Attention:   Good  ?Concentration:   Good  ?Memory:   WNL  ?Fund of knowledge:    Good  ?Insight:     developing  ?Judgment:    good  ?Impulse Control:   good  ?  ?Reported Symptoms: Depressed mood most days, anxiety, rumination, hx of mood swings, crying spells  ?  ?Risk Assessment: ?Danger to Self:  No ?Self-injurious Behavior:  Positive history, none in the past year  ?Danger to Others: No ?Duty to Warn:no ?Physical Aggression / Violence:No  ?Access to Firearms a concern: No  ?Gang Involvement:No  ?Patient / guardian was educated about steps to take if suicide or homicide risk level increases between visits. ?While future psychiatric events cannot be accurately predicted, the patient does not currently require acute inpatient psychiatric care and does not currently meet [facility] involuntary commitment criteria. ?  ?Subjective:   ?Patient presents on time for today's session.  Patient reports feelings of ongoing distress identifying "I feel like nothing is ever going to go right".  Specifically, she had gone on a job interview today which she feels went well however, she is unsure of potentially taking the job if offered due to some reasons she shared in session.  Through guided discovery, she admits she struggles with change, tends to catastrophize her ability to adjust and thrive through times of adjustment and this being at least a partial reason for her  resistance and increased anxiety has increased.  We reviewed past hopes and expectations she is identified such as her gaining more independence, eventually moving out of her parent's home, how the job she applied for today would give her these opportunities, albeit there might be some concessions she might have to make, such as having to cover her tattoos with a work dress code.  She plans to further discuss if she has more contact with the company and we encouraged her to allow this to occur as she is making some assumptions and having more anxiety and distress as a result.  Her plan is to reach out to the company on Monday to inquire about timeframes they may have toward hiring and ask any questions she may have to make her decision. ? ?  ?Interventions:  supportive therapy, strength based approaches, motivational interviewing ? ?Diagnosis: ?  ICD-10-CM   ?1. Bipolar II disorder ([place])  F31.81   ?  ? ? ? ? ? ? ?Plan:   Patient to follow through with exercise regimen.  Patient to continue to be mindful of her dietary intake, ensuring that she is getting adequate nutrition.  Patient continued to journal as needed related to life stressors and the continual outlet needed related to the loss of her father.  Patient to continue to work on setting boundaries in relationships. ? ?Long-term goal:   ?Reduce overall level,  frequency, and intensity of the feelings of depression and anxiety in  severity for at least 3 consecutive months. ?  ?Short-term goal:  ?Decrease "deflating, self-doubting" and catastrophizing thinking style ?Decrease anxiety producing self talk such as thinking of the worse possible life outcomes ?Decrease panic episodes by utilizing coping skills discussed in session.  ?Patient to utilize coping as discussed in session, setting boundaries and relationship as needed, "saying no" as needed. ?Patient to take medications as prescribed and report any concerns to her prescribing provider ?  ?Assessment of  progress:  progressing ? ? ?Anson [place], [facility] ? ?   ?

## 2022-02-04 NOTE — Progress Notes (Signed)
?  Subjective:  ?  ? Patient ID: Paula Elliott, female   DOB: [birth year], 31 y.o.   MRN: [id number] ? ?HPI ?[name] is a 31 year old white female,single, G0P0, in complaining of cyst/mass behind navel. She is also having constipation. ?PCP is B Blanch Media NP. ? ?Lab Results  ?Component Value Date  ? DIAGPAP (A) 07/03/2020  ?  - Atypical squamous cells of undetermined significance (ASC-US)  ? Akins Negative 07/03/2020  ?  ?Review of Systems ?Has cyst/mass behind navel and it pokes out when exercising, she wants it removed  ?+constipation, may go once a week and it is hard, stomach hurts at times, has tried fiber and magnesium without relief ?Reviewed past medical,surgical, social and family history. Reviewed medications and allergies.  ? ?   ?Objective:  ? Physical Exam ?BP 116/84 (BP Location: Left Arm, Patient Position: Sitting, Cuff Size: Normal)   Pulse (!) 111   Ht '5\' 4"'$  (1.626 m)   Wt 126 lb (57.2 kg)   LMP  (LMP Unknown)   BMI 21.63 kg/m?   ?  Skin warm and dry. Neck: mid line trachea, normal thyroid, good ROM, no lymphadenopathy noted. Lungs: clear to ausculation bilaterally. Cardiovascular: regular rate and rhythm. +murmur  ?Has 5 x 6 cm firm, mobile mass behind navel, first seen on CT 08/12/20 was 5.9 x 3.0 x 2.8 cm then. ? Upstream - 02/04/22 1005   ? ?  ? Pregnancy Intention Screening  ? Does the patient want to become pregnant in the next year? No   ? Does the patient's partner want to become pregnant in the next year? No   ? Would the patient like to discuss contraceptive options today? No   ?  ? Contraception Wrap Up  ? Current Method Oral Contraceptive   ? End Method Oral Contraceptive   ? Contraception Counseling Provided No   ? ?  ?  ? ?  ?  ?Assessment:  ?   ?1. Periumbilical mass ?Referred to Dr Arnoldo Morale for surgical consult ? ?2. Constipation, unspecified constipation type ?Will try linzess, Good Rx card given too ?Can message me on MyChart how it works  ?Meds ordered this encounter   ?Medications  ? linaclotide (LINZESS) 290 MCG CAPS capsule  ?  Sig: Take 1 capsule (290 mcg total) by mouth daily before breakfast.  ?  Dispense:  30 capsule  ?  Refill:  6  ?  Order Specific Question:   Supervising Provider  ?  Answer:   Tania Ade H [[id number]]  ?  ?   ?Plan:  ?   ?Follow up prn  ?   ?

## 2022-02-04 NOTE — Telephone Encounter (Signed)
Called pharmacy, no alternatives listed for Linzess. Asked to call their helpcare line. Called and got a list of alternatives that insurance would cover, but would still require a PA.  ? ?Lactulose 10 gram/15 mL ?Symproic 0.2 mg ?Movantic 12.5 mg ?

## 2022-02-11 ENCOUNTER — Encounter: Payer: Self-pay | Admitting: General Surgery

## 2022-02-11 ENCOUNTER — Other Ambulatory Visit: Payer: Self-pay

## 2022-02-11 ENCOUNTER — Ambulatory Visit: Payer: BC Managed Care – PPO | Admitting: General Surgery

## 2022-02-11 VITALS — BP 135/91 | HR 88 | Temp 97.4°F | Resp 12 | Ht 64.0 in | Wt 127.0 lb

## 2022-02-11 DIAGNOSIS — Q430 Meckel's diverticulum (displaced) (hypertrophic): Secondary | ICD-10-CM | POA: Diagnosis not present

## 2022-02-12 NOTE — Progress Notes (Signed)
Paula Elliott; [id number]; Oct 31, 1991 ? ? ?HPI ?Patient is a 31 year old white female who was referred to my care by Derrek Monaco and Hendricks Limes for evaluation and treatment of an umbilical mass.  Patient states has been present for some time, but is increased in size and is causing her discomfort.  She has had no drainage from the mass.  This was seen on a CT scan done in September 2021.  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  Due to increasing discomfort, the patient is now referred for treatment. ?Past Medical History:  ?Diagnosis Date  ? Anxiety   ? Depression   ? Heart murmur   ? ? ?Past Surgical History:  ?Procedure Laterality Date  ? CARDIAC SURGERY    ? CLAVICLE SURGERY    ? HIP OPEN REDUCTION  1994  ? open heart   1996  ? PARTIAL HIP ARTHROPLASTY    ? ? ?Family History  ?Problem Relation Age of Onset  ? Depression Father   ? Leukemia Father   ? Depression Mother   ? Hyperlipidemia Mother   ? Hypertension Mother   ? ? ?Current Outpatient Medications on File Prior to Visit  ?Medication Sig Dispense Refill  ? amphetamine-dextroamphetamine (ADDERALL XR) 25 MG 24 hr capsule Take 25 mg by mouth every morning.    ? amphetamine-dextroamphetamine (ADDERALL) 5 MG tablet Take 1 tablet by mouth daily.    ? buPROPion (WELLBUTRIN XL) 300 MG 24 hr tablet Take 300 mg by mouth daily.    ? lamoTRIgine (LAMICTAL) 150 MG tablet Take 150 mg by mouth at bedtime.    ? Norethindrone-Ethinyl Estradiol-Fe Biphas (LO LOESTRIN FE) 1 MG-10 MCG / 10 MCG tablet Take 1 tablet by mouth daily. Take 1 daily by mouth 28 tablet 11  ? QUEtiapine (SEROQUEL) 25 MG tablet Take 75 mg by mouth at bedtime.    ? linaclotide (LINZESS) 290 MCG CAPS capsule Take 1 capsule (290 mcg total) by mouth daily before breakfast. (Patient not taking: Reported on 02/11/2022) 30 capsule 6  ? ?No current facility-administered medications on file prior to visit.  ? ? ?No Known Allergies ? ?Social History  ? ?Substance and  Sexual Activity  ?Alcohol Use Yes  ? Comment: 1/5 vodka over weekend, 3 times this past month or so.   ? ? ?Social History  ? ?Tobacco Use  ?Smoking Status Former  ? Packs/day: 0.25  ? Types: Cigarettes, E-cigarettes  ?Smokeless Tobacco Never  ? ? ?Review of Systems  ?Constitutional: Negative.   ?HENT: Negative.    ?Eyes: Negative.   ?Respiratory: Negative.    ?Cardiovascular: Negative.   ?Gastrointestinal: Negative.   ?Genitourinary: Negative.   ?Musculoskeletal: Negative.   ?Skin: Negative.   ?Neurological: Negative.   ?Endo/Heme/Allergies: Negative.   ?Psychiatric/Behavioral: Negative.    ? ?Objective  ? ?Vitals:  ? 02/11/22 1059  ?BP: (!) 135/91  ?Pulse: 88  ?Resp: 12  ?Temp: (!) 97.4 ?F (36.3 ?C)  ?SpO2: 98%  ? ? ?Physical Exam ?Vitals reviewed.  ?Constitutional:   ?   Appearance: Normal appearance. She is normal weight. She is not ill-appearing.  ?HENT:  ?   Head: Normocephalic and atraumatic.  ?Cardiovascular:  ?   Rate and Rhythm: Normal rate and regular rhythm.  ?   Heart sounds: Normal heart sounds. No murmur heard. ?  No friction rub. No gallop.  ?Pulmonary:  ?   Effort: Pulmonary effort is normal. No respiratory distress.  ?   Breath sounds: Normal  breath sounds. No stridor. No wheezing, rhonchi or rales.  ?Abdominal:  ?   General: Abdomen is flat. Bowel sounds are normal. There is no distension.  ?   Palpations: Abdomen is soft. There is mass.  ?   Tenderness: There is no abdominal tenderness. There is no guarding or rebound.  ?   Hernia: No hernia is present.  ?   Comments: Large cystic mass encompassing the umbilicus.  No erythema or drainage noted.  ?Skin: ?   General: Skin is warm and dry.  ?Neurological:  ?   Mental Status: She is alert and oriented to person, place, and time.  ? ?CT scan images personally reviewed ? ?Assessment  ?Omphalocele mesenteric duct cyst remnant.  This may be emanating from the small bowel which would be consistent with a Meckel's diverticulum. ?Plan  ?Patient is scheduled  for diagnostic laparoscopy, excision of the omphalocele mesenteric duct cyst on 02/24/2022.  The risks and benefits of the procedure including bleeding, infection, bowel injury, and the possibility of an open procedure were fully explained to the patient, who gave informed consent.  I intend to do a laparoscopic Meckel's diverticulectomy but will need to do a wider umbilical incision to excise the cyst if it cannot be removed laparoscopically.  Patient understands and agrees to the surgery. ?

## 2022-02-12 NOTE — H&P (Signed)
Paula Elliott; [id number]; 22-Jan-1991 ? ? ?HPI ?Patient is a 31 year old white female who was referred to my care by Derrek Monaco and Hendricks Limes for evaluation and treatment of an umbilical mass.  Patient states has been present for some time, but is increased in size and is causing her discomfort.  She has had no drainage from the mass.  This was seen on a CT scan done in September 2021.  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  Due to increasing discomfort, the patient is now referred for treatment. ?Past Medical History:  ?Diagnosis Date  ? Anxiety   ? Depression   ? Heart murmur   ? ? ?Past Surgical History:  ?Procedure Laterality Date  ? CARDIAC SURGERY    ? CLAVICLE SURGERY    ? HIP OPEN REDUCTION  1994  ? open heart   1996  ? PARTIAL HIP ARTHROPLASTY    ? ? ?Family History  ?Problem Relation Age of Onset  ? Depression Father   ? Leukemia Father   ? Depression Mother   ? Hyperlipidemia Mother   ? Hypertension Mother   ? ? ?Current Outpatient Medications on File Prior to Visit  ?Medication Sig Dispense Refill  ? amphetamine-dextroamphetamine (ADDERALL XR) 25 MG 24 hr capsule Take 25 mg by mouth every morning.    ? amphetamine-dextroamphetamine (ADDERALL) 5 MG tablet Take 1 tablet by mouth daily.    ? buPROPion (WELLBUTRIN XL) 300 MG 24 hr tablet Take 300 mg by mouth daily.    ? lamoTRIgine (LAMICTAL) 150 MG tablet Take 150 mg by mouth at bedtime.    ? Norethindrone-Ethinyl Estradiol-Fe Biphas (LO LOESTRIN FE) 1 MG-10 MCG / 10 MCG tablet Take 1 tablet by mouth daily. Take 1 daily by mouth 28 tablet 11  ? QUEtiapine (SEROQUEL) 25 MG tablet Take 75 mg by mouth at bedtime.    ? linaclotide (LINZESS) 290 MCG CAPS capsule Take 1 capsule (290 mcg total) by mouth daily before breakfast. (Patient not taking: Reported on 02/11/2022) 30 capsule 6  ? ?No current facility-administered medications on file prior to visit.  ? ? ?No Known Allergies ? ?Social History  ? ?Substance and  Sexual Activity  ?Alcohol Use Yes  ? Comment: 1/5 vodka over weekend, 3 times this past month or so.   ? ? ?Social History  ? ?Tobacco Use  ?Smoking Status Former  ? Packs/day: 0.25  ? Types: Cigarettes, E-cigarettes  ?Smokeless Tobacco Never  ? ? ?Review of Systems  ?Constitutional: Negative.   ?HENT: Negative.    ?Eyes: Negative.   ?Respiratory: Negative.    ?Cardiovascular: Negative.   ?Gastrointestinal: Negative.   ?Genitourinary: Negative.   ?Musculoskeletal: Negative.   ?Skin: Negative.   ?Neurological: Negative.   ?Endo/Heme/Allergies: Negative.   ?Psychiatric/Behavioral: Negative.    ? ?Objective  ? ?Vitals:  ? 02/11/22 1059  ?BP: (!) 135/91  ?Pulse: 88  ?Resp: 12  ?Temp: (!) 97.4 ?F (36.3 ?C)  ?SpO2: 98%  ? ? ?Physical Exam ?Vitals reviewed.  ?Constitutional:   ?   Appearance: Normal appearance. She is normal weight. She is not ill-appearing.  ?HENT:  ?   Head: Normocephalic and atraumatic.  ?Cardiovascular:  ?   Rate and Rhythm: Normal rate and regular rhythm.  ?   Heart sounds: Normal heart sounds. No murmur heard. ?  No friction rub. No gallop.  ?Pulmonary:  ?   Effort: Pulmonary effort is normal. No respiratory distress.  ?   Breath sounds: Normal  breath sounds. No stridor. No wheezing, rhonchi or rales.  ?Abdominal:  ?   General: Abdomen is flat. Bowel sounds are normal. There is no distension.  ?   Palpations: Abdomen is soft. There is mass.  ?   Tenderness: There is no abdominal tenderness. There is no guarding or rebound.  ?   Hernia: No hernia is present.  ?   Comments: Large cystic mass encompassing the umbilicus.  No erythema or drainage noted.  ?Skin: ?   General: Skin is warm and dry.  ?Neurological:  ?   Mental Status: She is alert and oriented to person, place, and time.  ? ?CT scan images personally reviewed ? ?Assessment  ?Omphalocele mesenteric duct cyst remnant.  This may be emanating from the small bowel which would be consistent with a Meckel's diverticulum. ?Plan  ?Patient is scheduled  for diagnostic laparoscopy, excision of the omphalocele mesenteric duct cyst on 02/24/2022.  The risks and benefits of the procedure including bleeding, infection, bowel injury, and the possibility of an open procedure were fully explained to the patient, who gave informed consent.  I intend to do a laparoscopic Meckel's diverticulectomy but will need to do a wider umbilical incision to excise the cyst if it cannot be removed laparoscopically.  Patient understands and agrees to the surgery. ?

## 2022-02-17 NOTE — Patient Instructions (Signed)
? Your procedure is scheduled on: 02/24/2022 ? Report to [place] Entrance at   6:00  AM. ? Call this number if you have problems the morning of surgery: [phone] ? ? Remember: ? ? Do not Eat or Drink after midnight  ? ?      No Smoking the morning of surgery ? : ? Take these medicines the morning of surgery with A SIP OF WATER: Wellbutrin ? ? Do not wear jewelry, make-up or nail polish. ? Do not wear lotions, powders, or perfumes. You may wear deodorant. ? Do not shave 48 hours prior to surgery. Men may shave face and neck. ? Do not bring valuables to the hospital. ? Contacts, dentures or bridgework may not be worn into surgery. ? Leave suitcase in the car. After surgery it may be brought to your room. ? For patients admitted to the hospital, checkout time is 11:00 AM the day of discharge. ? ? Patients discharged the day of surgery will not be allowed to drive home. ?  ? Special Instructions: Shower using CHG night before surgery and shower the day of surgery use CHG.  Use special wash - you have one bottle of CHG for all showers.  You should use approximately 1/2 of the bottle for each shower.  ?How to Use Chlorhexidine for Bathing ?Chlorhexidine gluconate (CHG) is a germ-killing (antiseptic) solution that is used to clean the skin. It can get rid of the bacteria that normally live on the skin and can keep them away for about 24 hours. To clean your skin with CHG, you may be given: ?A CHG solution to use in the shower or as part of a sponge bath. ?A prepackaged cloth that contains CHG. ?Cleaning your skin with CHG may help lower the risk for infection: ?While you are staying in the intensive care unit of the hospital. ?If you have a vascular access, such as a central line, to provide short-term or long-term access to your veins. ?If you have a catheter to drain urine from your bladder. ?If you are on a ventilator. A ventilator is a machine that helps you breathe by moving air in and out of your  lungs. ?After surgery. ?What are the risks? ?Risks of using CHG include: ?A skin reaction. ?Hearing loss, if CHG gets in your ears and you have a perforated eardrum. ?Eye injury, if CHG gets in your eyes and is not rinsed out. ?The CHG product catching fire. ?Make sure that you avoid smoking and flames after applying CHG to your skin. ?Do not use CHG: ?If you have a chlorhexidine allergy or have previously reacted to chlorhexidine. ?On babies younger than 48 months of age. ?How to use CHG solution ?Use CHG only as told by your health care provider, and follow the instructions on the label. ?Use the full amount of CHG as directed. Usually, this is one bottle. ?During a shower ?Follow these steps when using CHG solution during a shower (unless your health care provider gives you different instructions): ?Start the shower. ?Use your normal soap and shampoo to wash your face and hair. ?Turn off the shower or move out of the shower stream. ?Pour the CHG onto a clean washcloth. Do not use any type of brush or rough-edged sponge. ?Starting at your neck, lather your body down to your toes. Make sure you follow these instructions: ?If you will be having surgery, pay special attention to the part of your body where you will be having surgery. Scrub  this area for at least 1 minute. ?Do not use CHG on your head or face. If the solution gets into your ears or eyes, rinse them well with water. ?Avoid your genital area. ?Avoid any areas of skin that have broken skin, cuts, or scrapes. ?Scrub your back and under your arms. Make sure to wash skin folds. ?Let the lather sit on your skin for 1-2 minutes or as long as told by your health care provider. ?Thoroughly rinse your entire body in the shower. Make sure that all body creases and crevices are rinsed well. ?Dry off with a clean towel. Do not put any substances on your body afterward--such as powder, lotion, or perfume--unless you are told to do so by your health care provider.  Only use lotions that are recommended by the manufacturer. ?Put on clean clothes or pajamas. ?If it is the night before your surgery, sleep in clean sheets. ? ?During a sponge bath ?Follow these steps when using CHG solution during a sponge bath (unless your health care provider gives you different instructions): ?Use your normal soap and shampoo to wash your face and hair. ?Pour the CHG onto a clean washcloth. ?Starting at your neck, lather your body down to your toes. Make sure you follow these instructions: ?If you will be having surgery, pay special attention to the part of your body where you will be having surgery. Scrub this area for at least 1 minute. ?Do not use CHG on your head or face. If the solution gets into your ears or eyes, rinse them well with water. ?Avoid your genital area. ?Avoid any areas of skin that have broken skin, cuts, or scrapes. ?Scrub your back and under your arms. Make sure to wash skin folds. ?Let the lather sit on your skin for 1-2 minutes or as long as told by your health care provider. ?Using a different clean, wet washcloth, thoroughly rinse your entire body. Make sure that all body creases and crevices are rinsed well. ?Dry off with a clean towel. Do not put any substances on your body afterward--such as powder, lotion, or perfume--unless you are told to do so by your health care provider. Only use lotions that are recommended by the manufacturer. ?Put on clean clothes or pajamas. ?If it is the night before your surgery, sleep in clean sheets. ?How to use CHG prepackaged cloths ?Only use CHG cloths as told by your health care provider, and follow the instructions on the label. ?Use the CHG cloth on clean, dry skin. ?Do not use the CHG cloth on your head or face unless your health care provider tells you to. ?When washing with the CHG cloth: ?Avoid your genital area. ?Avoid any areas of skin that have broken skin, cuts, or scrapes. ?Before surgery ?Follow these steps when using a  CHG cloth to clean before surgery (unless your health care provider gives you different instructions): ?Using the CHG cloth, vigorously scrub the part of your body where you will be having surgery. Scrub using a back-and-forth motion for 3 minutes. The area on your body should be completely wet with CHG when you are done scrubbing. ?Do not rinse. Discard the cloth and let the area air-dry. Do not put any substances on the area afterward, such as powder, lotion, or perfume. ?Put on clean clothes or pajamas. ?If it is the night before your surgery, sleep in clean sheets. ? ?For general bathing ?Follow these steps when using CHG cloths for general bathing (unless your health care provider  gives you different instructions). ?Use a separate CHG cloth for each area of your body. Make sure you wash between any folds of skin and between your fingers and toes. Wash your body in the following order, switching to a new cloth after each step: ?The front of your neck, shoulders, and chest. ?Both of your arms, under your arms, and your hands. ?Your stomach and groin area, avoiding the genitals. ?Your right leg and foot. ?Your left leg and foot. ?The back of your neck, your back, and your buttocks. ?Do not rinse. Discard the cloth and let the area air-dry. Do not put any substances on your body afterward--such as powder, lotion, or perfume--unless you are told to do so by your health care provider. Only use lotions that are recommended by the manufacturer. ?Put on clean clothes or pajamas. ?Contact a health care provider if: ?Your skin gets irritated after scrubbing. ?You have questions about using your solution or cloth. ?You swallow any chlorhexidine. Call your local poison control center (1-662-469-6999 in the [place] ?Get help right away if: ?Your eyes itch badly, or they become very red or swollen. ?Your skin itches badly and is red or swollen. ?Your hearing changes. ?You have trouble seeing. ?You have swelling or tingling in  your mouth or throat. ?You have trouble breathing. ?These symptoms may represent a serious problem that is an emergency. Do not wait to see if the symptoms will go away. Get medical help right away. Call your l

## 2022-02-17 NOTE — Progress Notes (Addendum)
Dr Charna Elizabeth reviewed Cardiac history and results from [facility] (Heart cath and stress test)  ordered EKG. No other orders given. ?

## 2022-02-18 ENCOUNTER — Other Ambulatory Visit: Payer: Self-pay

## 2022-02-18 ENCOUNTER — Ambulatory Visit (INDEPENDENT_AMBULATORY_CARE_PROVIDER_SITE_OTHER): Payer: BC Managed Care – PPO | Admitting: Mental Health

## 2022-02-18 DIAGNOSIS — F3181 Bipolar II disorder: Secondary | ICD-10-CM | POA: Diagnosis not present

## 2022-02-18 NOTE — Progress Notes (Signed)
?    [facility] Counselor/Therapist Progress Note ? ? ?Patient ID: Paula Elliott,  ?MRN: [id number] ?Date:   02/18/21 ? ?Timespent: 57 minutes   ? ?Treatment Type: Individual therapy ?  ? ?Mental Status Exam: ?  ?   ?Appearance:    Casual, appropriate  ?Behavior:   WNL  ?Motor:   WNL  ?Speech/Language:    Clear and Coherent  ?Affect:   Full range  ?Mood:   Anxious, pleasant  ?Thought process:   normal  ?Thought content:     WNL, denies SI/HI  ?Sensory/Perceptual disturbances:     none  ?Orientation:   oriented to person, place and time/date  ?Attention:   Good  ?Concentration:   Good  ?Memory:   WNL  ?Fund of knowledge:    Good  ?Insight:     developing  ?Judgment:    good  ?Impulse Control:   good  ?  ?Reported Symptoms: Depressed mood most days, anxiety, rumination, hx of mood swings, crying spells  ?  ?Risk Assessment: ?Danger to Self:  No ?Self-injurious Behavior:  Positive history, none in the past year  ?Danger to Others: No ?Duty to Warn:no ?Physical Aggression / Violence:No  ?Access to Firearms a concern: No  ?Gang Involvement:No  ?Patient / guardian was educated about steps to take if suicide or homicide risk level increases between visits. ?While future psychiatric events cannot be accurately predicted, the patient does not currently require acute inpatient psychiatric care and does not currently meet [facility] involuntary commitment criteria. ?  ?Subjective:   ?Patient presents on time for today's session.  She shared some positive changes, how she got a raise at work as well as a bonus.  She stated that she was approached by her supervisor and was made aware that it can be their understanding that was feeling overwhelmed.  Patient stated that she was able to be assertive and mentioning how she had expressed this previously but also values their taking to time to recognize and validate the sentiments through the discussion they were having.  Patient feels at this point, due to having to  pay increase is helpful in her deciding to keep her current employment, albeit she realizes that at some point she will need to get a different job due to having limited promotability at her current job as well as her income needing to continually increase to allow her to afford pain for her own residence.  Patient continues to reside with her mother, she stated that she has set some interpersonal boundaries with herself, allowing her mother to make her own decisions about who she refrains as patient has some concerns about her decisions due to her befriending others online.  Patient stated she is cautioned her mother about individuals on line who can be manipulative in the past but her mother does not heed her warnings.  Facilitated patient addressing how she has had some positive changes recently at work, receiving a raise and validation from her employer.  Patient was able to acknowledge positive changes that can occur in her life, while also acknowledging typically that "nothing ever goes right in my life" as one of her primary cognitions.  She was able to reframe this cognition and we reviewed the benefits of continuing to work on this between sessions. ? ? ? ?Interventions:  supportive therapy, strength based approaches, motivational interviewing ? ?Diagnosis: ?No diagnosis found. ? ? ? ? ? ? ?Plan:   Patient to follow through with exercise regimen.  Patient to continue to be mindful of her dietary intake, ensuring that she is getting adequate nutrition.  Patient continued to journal as needed related to life stressors and the continual outlet needed related to the loss of her father.  Patient to continue to work on setting boundaries in relationships. ? ?Long-term goal:   ?Reduce overall level, frequency, and intensity of the feelings of depression and anxiety in  severity for at least 3 consecutive months. ?  ?Short-term goal:  ?Decrease "deflating, self-doubting" and catastrophizing thinking style ?Decrease  anxiety producing self talk such as thinking of the worse possible life outcomes ?Decrease panic episodes by utilizing coping skills discussed in session.  ?Patient to utilize coping as discussed in session, setting boundaries and relationship as needed, "saying no" as needed. ?Patient to take medications as prescribed and report any concerns to her prescribing provider ?  ?Assessment of progress:  progressing ? ? ?[place], [facility] ? ?   ?

## 2022-02-19 DIAGNOSIS — F900 Attention-deficit hyperactivity disorder, predominantly inattentive type: Secondary | ICD-10-CM | POA: Diagnosis not present

## 2022-02-19 DIAGNOSIS — F3181 Bipolar II disorder: Secondary | ICD-10-CM | POA: Diagnosis not present

## 2022-02-19 DIAGNOSIS — F418 Other specified anxiety disorders: Secondary | ICD-10-CM | POA: Diagnosis not present

## 2022-02-22 ENCOUNTER — Encounter (HOSPITAL_COMMUNITY)
Admission: RE | Admit: 2022-02-22 | Discharge: 2022-02-22 | Disposition: A | Discharge status: Home / Self Care | Payer: BC Managed Care – PPO | Source: Ambulatory Visit | Attending: General Surgery | Admitting: General Surgery

## 2022-02-22 ENCOUNTER — Encounter (HOSPITAL_COMMUNITY): Payer: Self-pay

## 2022-02-22 VITALS — BP 114/74 | HR 56 | Temp 97.4°F | Resp 16 | Ht 64.0 in | Wt 127.0 lb

## 2022-02-22 DIAGNOSIS — Z01818 Encounter for other preprocedural examination: Secondary | ICD-10-CM | POA: Insufficient documentation

## 2022-02-22 DIAGNOSIS — Z9889 Other specified postprocedural states: Secondary | ICD-10-CM | POA: Insufficient documentation

## 2022-02-22 LAB — PREGNANCY, URINE: Preg Test, Ur: NEGATIVE

## 2022-02-24 ENCOUNTER — Ambulatory Visit (HOSPITAL_COMMUNITY)
Admission: RE | Admit: 2022-02-24 | Discharge: 2022-02-24 | Disposition: A | Discharge status: Home / Self Care | Payer: BC Managed Care – PPO | Source: Ambulatory Visit | Attending: General Surgery | Admitting: General Surgery

## 2022-02-24 ENCOUNTER — Ambulatory Visit (HOSPITAL_COMMUNITY): Payer: BC Managed Care – PPO | Admitting: Anesthesiology

## 2022-02-24 ENCOUNTER — Encounter (HOSPITAL_COMMUNITY)
Admission: RE | Disposition: A | Discharge status: Home / Self Care | Payer: Self-pay | Source: Ambulatory Visit | Attending: General Surgery

## 2022-02-24 ENCOUNTER — Other Ambulatory Visit: Payer: Self-pay

## 2022-02-24 ENCOUNTER — Encounter (HOSPITAL_COMMUNITY): Payer: Self-pay | Admitting: General Surgery

## 2022-02-24 DIAGNOSIS — Q792 Exomphalos: Secondary | ICD-10-CM | POA: Diagnosis not present

## 2022-02-24 DIAGNOSIS — Q430 Meckel's diverticulum (displaced) (hypertrophic): Secondary | ICD-10-CM | POA: Diagnosis not present

## 2022-02-24 DIAGNOSIS — F419 Anxiety disorder, unspecified: Secondary | ICD-10-CM | POA: Diagnosis not present

## 2022-02-24 DIAGNOSIS — Z01818 Encounter for other preprocedural examination: Secondary | ICD-10-CM

## 2022-02-24 DIAGNOSIS — K439 Ventral hernia without obstruction or gangrene: Secondary | ICD-10-CM | POA: Diagnosis not present

## 2022-02-24 DIAGNOSIS — Z87891 Personal history of nicotine dependence: Secondary | ICD-10-CM | POA: Diagnosis not present

## 2022-02-24 DIAGNOSIS — F32A Depression, unspecified: Secondary | ICD-10-CM | POA: Insufficient documentation

## 2022-02-24 HISTORY — PX: VENTRAL HERNIA REPAIR: SHX424

## 2022-02-24 HISTORY — PX: LAPAROSCOPY: SHX197

## 2022-02-24 SURGERY — LAPAROSCOPY, DIAGNOSTIC
Anesthesia: General | Site: Abdomen

## 2022-02-24 MED ORDER — LACTATED RINGERS IV SOLN: INTRAVENOUS | Status: DC | PRN

## 2022-02-24 MED ORDER — DEXAMETHASONE SODIUM PHOSPHATE 10 MG/ML IJ SOLN
INTRAMUSCULAR | Status: DC | PRN
  Administered 2022-02-24: 5 mg via INTRAVENOUS

## 2022-02-24 MED ORDER — FENTANYL CITRATE PF 50 MCG/ML IJ SOSY
25.0000 ug | PREFILLED_SYRINGE | INTRAMUSCULAR | Status: DC | PRN
  Administered 2022-02-24: 50 ug via INTRAVENOUS
  Filled 2022-02-24: qty 1

## 2022-02-24 MED ORDER — HYDROCODONE-ACETAMINOPHEN 10-325 MG PO TABS: 1.0000 | ORAL_TABLET | Freq: Four times a day (QID) | ORAL | 0 refills | Status: DC | PRN

## 2022-02-24 MED ORDER — DEXAMETHASONE SODIUM PHOSPHATE 10 MG/ML IJ SOLN
INTRAMUSCULAR | Status: AC
  Filled 2022-02-24: qty 1

## 2022-02-24 MED ORDER — MIDAZOLAM HCL 5 MG/5ML IJ SOLN
INTRAMUSCULAR | Status: DC | PRN
  Administered 2022-02-24: 2 mg via INTRAVENOUS

## 2022-02-24 MED ORDER — CHLORHEXIDINE GLUCONATE CLOTH 2 % EX PADS: 6.0000 | MEDICATED_PAD | Freq: Once | CUTANEOUS | Status: DC

## 2022-02-24 MED ORDER — METRONIDAZOLE 500 MG/100ML IV SOLN
500.0000 mg | INTRAVENOUS | Status: AC
  Administered 2022-02-24: 500 mg via INTRAVENOUS
  Filled 2022-02-24: qty 100

## 2022-02-24 MED ORDER — FENTANYL CITRATE (PF) 250 MCG/5ML IJ SOLN
INTRAMUSCULAR | Status: AC
  Filled 2022-02-24: qty 5

## 2022-02-24 MED ORDER — 0.9 % SODIUM CHLORIDE (POUR BTL) OPTIME
TOPICAL | Status: DC | PRN
  Administered 2022-02-24: 1000 mL

## 2022-02-24 MED ORDER — ONDANSETRON HCL 4 MG/2ML IJ SOLN: 4.0000 mg | Freq: Once | INTRAMUSCULAR | Status: DC | PRN

## 2022-02-24 MED ORDER — LIDOCAINE HCL (CARDIAC) PF 100 MG/5ML IV SOSY
PREFILLED_SYRINGE | INTRAVENOUS | Status: DC | PRN
  Administered 2022-02-24: 50 mg via INTRAVENOUS

## 2022-02-24 MED ORDER — SUGAMMADEX SODIUM 500 MG/5ML IV SOLN
INTRAVENOUS | Status: DC | PRN
  Administered 2022-02-24: 200 mg via INTRAVENOUS

## 2022-02-24 MED ORDER — BUPIVACAINE LIPOSOME 1.3 % IJ SUSP
INTRAMUSCULAR | Status: AC
Start: 2022-02-24 — End: ?
  Filled 2022-02-24: qty 20

## 2022-02-24 MED ORDER — ONDANSETRON HCL 4 MG/2ML IJ SOLN
INTRAMUSCULAR | Status: AC
  Filled 2022-02-24: qty 2

## 2022-02-24 MED ORDER — ROCURONIUM BROMIDE 10 MG/ML (PF) SYRINGE
PREFILLED_SYRINGE | INTRAVENOUS | Status: AC
  Filled 2022-02-24: qty 10

## 2022-02-24 MED ORDER — SODIUM CHLORIDE 0.9 % IV SOLN
2.0000 g | INTRAVENOUS | Status: AC
  Administered 2022-02-24: 2 g via INTRAVENOUS
  Filled 2022-02-24: qty 20

## 2022-02-24 MED ORDER — ROCURONIUM BROMIDE 100 MG/10ML IV SOLN
INTRAVENOUS | Status: DC | PRN
  Administered 2022-02-24: 50 mg via INTRAVENOUS

## 2022-02-24 MED ORDER — MIDAZOLAM HCL 2 MG/2ML IJ SOLN
INTRAMUSCULAR | Status: AC
  Filled 2022-02-24: qty 2

## 2022-02-24 MED ORDER — ONDANSETRON HCL 4 MG/2ML IJ SOLN
INTRAMUSCULAR | Status: DC | PRN
  Administered 2022-02-24: 4 mg via INTRAVENOUS

## 2022-02-24 MED ORDER — FENTANYL CITRATE (PF) 100 MCG/2ML IJ SOLN
INTRAMUSCULAR | Status: DC | PRN
  Administered 2022-02-24 (×3): 50 ug via INTRAVENOUS
  Administered 2022-02-24: 100 ug via INTRAVENOUS

## 2022-02-24 MED ORDER — PROPOFOL 10 MG/ML IV BOLUS
INTRAVENOUS | Status: DC | PRN
  Administered 2022-02-24: 150 mg via INTRAVENOUS

## 2022-02-24 MED ORDER — BUPIVACAINE LIPOSOME 1.3 % IJ SUSP
INTRAMUSCULAR | Status: DC | PRN
  Administered 2022-02-24: 20 mL

## 2022-02-24 MED ORDER — POVIDONE-IODINE 10 % EX OINT
TOPICAL_OINTMENT | CUTANEOUS | Status: AC
  Filled 2022-02-24: qty 1

## 2022-02-24 MED ORDER — PROPOFOL 10 MG/ML IV BOLUS
INTRAVENOUS | Status: AC
  Filled 2022-02-24: qty 20

## 2022-02-24 MED ORDER — LIDOCAINE HCL (PF) 2 % IJ SOLN
INTRAMUSCULAR | Status: AC
  Filled 2022-02-24: qty 5

## 2022-02-24 MED ORDER — KETOROLAC TROMETHAMINE 30 MG/ML IJ SOLN
30.0000 mg | Freq: Once | INTRAMUSCULAR | Status: AC
  Administered 2022-02-24: 30 mg via INTRAVENOUS
  Filled 2022-02-24: qty 1

## 2022-02-24 SURGICAL SUPPLY — 51 items
BAG RETRIEVAL 10 (BASKET) ×1
CHLORAPREP W/TINT 26 (MISCELLANEOUS) ×2 IMPLANT
CLOTH BEACON ORANGE TIMEOUT ST (SAFETY) ×2 IMPLANT
COVER LIGHT HANDLE STERIS (MISCELLANEOUS) ×4 IMPLANT
CUTTER FLEX LINEAR 45M (STAPLE) ×2 IMPLANT
DECANTER SPIKE VIAL GLASS SM (MISCELLANEOUS) ×2 IMPLANT
DERMABOND ADVANCED (GAUZE/BANDAGES/DRESSINGS) ×1
DERMABOND ADVANCED .7 DNX12 (GAUZE/BANDAGES/DRESSINGS) IMPLANT
ELECT REM PT RETURN 9FT ADLT (ELECTROSURGICAL) ×2
ELECTRODE REM PT RTRN 9FT ADLT (ELECTROSURGICAL) ×1 IMPLANT
GLOVE SURG POLYISO LF SZ7.5 (GLOVE) ×2 IMPLANT
GLOVE SURG UNDER POLY LF SZ7 (GLOVE) ×4 IMPLANT
GOWN STRL REUS W/TWL LRG LVL3 (GOWN DISPOSABLE) ×4 IMPLANT
INST SET LAPROSCOPIC AP (KITS) ×1 IMPLANT
IV NS IRRIG 3000ML ARTHROMATIC (IV SOLUTION) IMPLANT
KIT TURNOVER KIT A (KITS) ×2 IMPLANT
LIGASURE LAP ATLAS 10MM 37CM (INSTRUMENTS) ×2 IMPLANT
MANIFOLD NEPTUNE II (INSTRUMENTS) ×2 IMPLANT
MESH VENTRALEX ST 1-7/10 CRC S (Mesh General) ×1 IMPLANT
NDL HYPO 21X1.5 SAFETY (NEEDLE) ×1 IMPLANT
NDL INSUFFLATION 14GA 120MM (NEEDLE) ×1 IMPLANT
NEEDLE HYPO 21X1.5 SAFETY (NEEDLE) ×2 IMPLANT
NEEDLE INSUFFLATION 14GA 120MM (NEEDLE) ×2 IMPLANT
NS IRRIG 1000ML POUR BTL (IV SOLUTION) ×2 IMPLANT
PACK LAP CHOLE LZT030E (CUSTOM PROCEDURE TRAY) ×2 IMPLANT
PAD ARMBOARD 7.5X6 YLW CONV (MISCELLANEOUS) ×2 IMPLANT
PENCIL SMOKE EVACUATOR (MISCELLANEOUS) ×1 IMPLANT
RELOAD 45 VASCULAR/THIN (ENDOMECHANICALS) IMPLANT
RELOAD STAPLE 45 2.5 WHT GRN (ENDOMECHANICALS) IMPLANT
RELOAD STAPLE 45 3.5 BLU ETS (ENDOMECHANICALS) IMPLANT
RELOAD STAPLE TA45 3.5 REG BLU (ENDOMECHANICALS) IMPLANT
SET BASIN LINEN APH (SET/KITS/TRAYS/PACK) ×2 IMPLANT
SET TUBE IRRIG SUCTION NO TIP (IRRIGATION / IRRIGATOR) ×2 IMPLANT
SET TUBE SMOKE EVAC HIGH FLOW (TUBING) ×2 IMPLANT
SHEARS HARMONIC ACE PLUS 36CM (ENDOMECHANICALS) ×2 IMPLANT
SPONGE GAUZE 2X2 8PLY STRL LF (GAUZE/BANDAGES/DRESSINGS) ×6 IMPLANT
STAPLER VISISTAT (STAPLE) ×2 IMPLANT
SUT ETHIBOND NAB MO 7 #0 18IN (SUTURE) ×1 IMPLANT
SUT MNCRL AB 4-0 PS2 18 (SUTURE) ×2 IMPLANT
SUT VICRYL 0 UR6 27IN ABS (SUTURE) ×2 IMPLANT
SUT VICRYL AB 3 0 TIES (SUTURE) ×1 IMPLANT
SYR 20ML LL LF (SYRINGE) ×2 IMPLANT
SYR 30ML LL (SYRINGE) ×1 IMPLANT
SYS BAG RETRIEVAL 10MM (BASKET) ×1
SYSTEM BAG RETRIEVAL 10MM (BASKET) ×1 IMPLANT
TRAY FOLEY SLVR 16FR LF STAT (SET/KITS/TRAYS/PACK) ×2 IMPLANT
TROCAR ENDO BLADELESS 11MM (ENDOMECHANICALS) ×2 IMPLANT
TROCAR ENDO BLADELESS 12MM (ENDOMECHANICALS) ×2 IMPLANT
TROCAR XCEL NON-BLD 5MMX100MML (ENDOMECHANICALS) ×2 IMPLANT
TROCAR XCEL UNIV SLVE 11M 100M (ENDOMECHANICALS) ×1 IMPLANT
WARMER LAPAROSCOPE (MISCELLANEOUS) ×2 IMPLANT

## 2022-02-24 NOTE — Anesthesia Preprocedure Evaluation (Signed)
Anesthesia Evaluation  ?Patient identified by MRN, date of birth, ID band ?Patient awake ? ? ? ?Reviewed: ?Allergy & Precautions, H&P , NPO status , Patient's Chart, lab work & pertinent test results, reviewed documented beta blocker date and time  ? ?Airway ?Mallampati: II ? ?TM Distance: >3 FB ?Neck ROM: full ? ? ? Dental ?no notable dental hx. ? ?  ?Pulmonary ?neg pulmonary ROS, former smoker,  ?  ?Pulmonary exam normal ?breath sounds clear to auscultation ? ? ? ? ? ? Cardiovascular ?Exercise Tolerance: Good ?negative cardio ROS ? ? ?Rhythm:regular Rate:Normal ? ? ?  ?Neuro/Psych ?PSYCHIATRIC DISORDERS Anxiety Depression negative neurological ROS ?   ? GI/Hepatic ?negative GI ROS, Neg liver ROS,   ?Endo/Other  ?negative endocrine ROS ? Renal/GU ?negative Renal ROS  ?negative genitourinary ?  ?Musculoskeletal ? ? Abdominal ?  ?Peds ? Hematology ?negative hematology ROS ?(+)   ?Anesthesia Other Findings ? ? Reproductive/Obstetrics ?negative OB ROS ? ?  ? ? ? ? ? ? ? ? ? ? ? ? ? ?  ?  ? ? ? ? ? ? ? ? ?Anesthesia Physical ?Anesthesia Plan ? ?ASA: 2 ? ?Anesthesia Plan: General and General ETT  ? ?Post-op Pain Management:   ? ?Induction:  ? ?PONV Risk Score and Plan: Ondansetron ? ?Airway Management Planned:  ? ?Additional Equipment:  ? ?Intra-op Plan:  ? ?Post-operative Plan:  ? ?Informed Consent: I have reviewed the patients History and Physical, chart, labs and discussed the procedure including the risks, benefits and alternatives for the proposed anesthesia with the patient or authorized representative who has indicated his/her understanding and acceptance.  ? ? ? ?Dental Advisory Given ? ?Plan Discussed with: CRNA ? ?Anesthesia Plan Comments:   ? ? ? ? ? ? ?Anesthesia Quick Evaluation ? ?

## 2022-02-24 NOTE — Anesthesia Procedure Notes (Signed)
Procedure Name: Intubation ?Date/Time: 02/24/2022 7:38 AM ?Performed by: Jonna Munro, CRNA ?Pre-anesthesia Checklist: Patient identified, Emergency Drugs available, Suction available, Patient being monitored and Timeout performed ?Patient Re-evaluated:Patient Re-evaluated prior to induction ?Oxygen Delivery Method: Circle system utilized ?Preoxygenation: Pre-oxygenation with 100% oxygen ?Induction Type: IV induction ?Ventilation: Mask ventilation without difficulty ?Laryngoscope Size: Mac and 3 ?Grade View: Grade I ?Tube type: Oral ?Tube size: 7.0 mm ?Number of attempts: 1 ?Airway Equipment and Method: Stylet ?Placement Confirmation: ETT inserted through vocal cords under direct vision, positive ETCO2 and breath sounds checked- equal and bilateral ?Secured at: 22 cm ?Tube secured with: Tape ?Dental Injury: Teeth and Oropharynx as per pre-operative assessment  ? ? ? ? ?

## 2022-02-24 NOTE — Interval H&P Note (Signed)
History and Physical Interval Note: ? ?02/24/2022 ?7:19 AM ? ?Paula Elliott  has presented today for surgery, with the diagnosis of Omphalomesenteric Duct Remnant.  The various methods of treatment have been discussed with the patient and family. After consideration of risks, benefits and other options for treatment, the patient has consented to  Procedure(s): ?LAPAROSCOPY DIAGNOSTIC WITH EXCISION OF OMPHALOMESENTERIC RENMANT (N/A) as a surgical intervention.  The patient's history has been reviewed, patient examined, no change in status, stable for surgery.  I have reviewed the patient's chart and labs.  Questions were answered to the patient's satisfaction.   ? ? ?Aviva Signs ? ? ?

## 2022-02-24 NOTE — Op Note (Signed)
Patient:  Paula Elliott ? ?DOB:  [birth year] ? ?MRN:  [id number] ? ? ?Preop Diagnosis: Omphalocele mesenteric duct cyst ? ?Postop Diagnosis: Same ? ?Procedure: Diagnostic laparoscopy, excision of omphalocele mesenteric duct cyst, ventral herniorrhaphy with mesh ? ?Surgeon: Aviva Signs, MD ? ?Anes: General endotracheal ? ?Indications: Patient is a 31 year old white female with a longstanding history of an umbilical mass.  CT scan of the abdomen reveals an omphalocele mesenteric duct cyst with a possible connection to the terminal ileum.  The patient now comes to the operating room for diagnostic laparoscopy and excision of the omphalocele mesenteric duct cyst.  The risks and benefits of the procedures including bleeding, infection, bowel injury, the possibility of mesh use, the possibility of an open procedure were fully explained to the patient, who gave informed consent. ? ?Procedure note: The patient was placed in supine position.  After induction of general endotracheal anesthesia, the abdomen was prepped and draped using the usual sterile technique with ChloraPrep.  Surgical site confirmation was performed. ? ?A Veress needle was placed in the epigastric region.  Confirmation of placement was done using the saline drop test.  The abdomen was then insufflated to 15 mmHg pressure.  An 11 mm trocar was placed in the left upper quadrant under direct visualization without difficulty.  The liver was inspected and no bleeding was noted.  On inspection of the umbilicus, there was an obvious mass that was extraperitoneal in nature.  There was no connection to the terminal ileum.  The liver and gallbladder appeared to be in normal limits.  As there was no intra-abdominal connection to the terminal ileum, I elected to proceed with excision of the omphalocele mesenteric duct cyst.  The abdomen was decompressed.  An infraumbilical incision was made down to the fascia.  The umbilicus was freed away from the cystic  mass.  Gelatinous material was noted within the cystic mass.  The mass was excised down to the fascia.  A residual urachal tissue was ligated using 3-0 Vicryl tie.  The cyst was sent to pathology for further examination.  Given the size of the resultant abdominal wall defect which measured approximately 2-3 cm in its greatest diameter, I proceeded with a mesh repair.  A 4.7 cm Bard Ventralax ST patch was then inserted and secured to the fascia using 0 Ethibond interrupted sutures.  The overlying fascia was reapproximated over the mesh transversely using 0 Ethibond interrupted sutures.  The base of the umbilicus was secured back to the fascia using a 2-0 Vicryl interrupted suture.  The subcutaneous layer was reapproximated using 3-0 Vicryl interrupted sutures.  Exparel was instilled into both incisions.  Both incisions were closed using a 4-0 Monocryl subcuticular suture.  Dermabond was applied. ? ?All tape and needle counts were correct at the end of the procedure.  The patient was extubated in the operating room and transferred to PACU in stable condition. ? ?Complications: None ? ?EBL: Minimal ? ?Specimen: Omphalocele mesenteric duct cyst ? ? ?  ?

## 2022-02-24 NOTE — Anesthesia Postprocedure Evaluation (Signed)
Anesthesia Post Note ? ?Patient: Talley Casco ? ?Procedure(s) Performed: LAPAROSCOPY DIAGNOSTIC WITH EXCISION OF OMPHALOMESENTERIC RENMANT (Abdomen) ?HERNIA REPAIR VENTRAL ADULT W/ MESH (Abdomen) ? ?Patient location during evaluation: Phase II ?Anesthesia Type: General ?Level of consciousness: awake ?Pain management: pain level controlled ?Vital Signs Assessment: post-procedure vital signs reviewed and stable ?Respiratory status: spontaneous breathing and respiratory function stable ?Cardiovascular status: blood pressure returned to baseline and stable ?Postop Assessment: no headache and no apparent nausea or vomiting ?Anesthetic complications: no ?Comments: Late entry ? ? ?No notable events documented. ? ? ?Last Vitals:  ?Vitals:  ? 02/24/22 0915 02/24/22 0930  ?BP: 111/74 105/69  ?Pulse: 68 (!) 59  ?Resp: 13 15  ?Temp:  36.7 ?C  ?SpO2: 100% 100%  ?  ?Last Pain:  ?Vitals:  ? 02/24/22 0930  ?TempSrc: Oral  ?PainSc: 0-No pain  ? ? ?  ?  ?  ?  ?  ?  ? ?Louann Sjogren ? ? ? ? ?

## 2022-02-24 NOTE — Transfer of Care (Signed)
Immediate Anesthesia Transfer of Care Note ? ?Patient: Paula Elliott ? ?Procedure(s) Performed: LAPAROSCOPY DIAGNOSTIC WITH EXCISION OF OMPHALOMESENTERIC RENMANT ? ?Patient Location: PACU ? ?Anesthesia Type:General ? ?Level of Consciousness: awake, alert , oriented and patient cooperative ? ?Airway & Oxygen Therapy: Patient Spontanous Breathing and Patient connected to nasal cannula oxygen ? ?Post-op Assessment: Report given to RN, Post -op Vital signs reviewed and stable and Patient moving all extremities X 4 ? ?Post vital signs: Reviewed and stable ? ?Last Vitals:  ?Vitals Value Taken Time  ?BP 102/70 02/24/22 0852  ?Temp    ?Pulse 73 02/24/22 0854  ?Resp 8 02/24/22 0854  ?SpO2 100 % 02/24/22 0854  ?Vitals shown include unvalidated device data. ? ?Last Pain:  ?Vitals:  ? 02/24/22 0701  ?TempSrc: Oral  ?PainSc:   ?   ? ?  ? ?Complications: No notable events documented. ?

## 2022-02-25 LAB — SURGICAL PATHOLOGY

## 2022-03-01 ENCOUNTER — Encounter (HOSPITAL_COMMUNITY): Payer: Self-pay | Admitting: General Surgery

## 2022-03-04 ENCOUNTER — Ambulatory Visit: Payer: BC Managed Care – PPO | Admitting: Mental Health

## 2022-03-04 ENCOUNTER — Ambulatory Visit (INDEPENDENT_AMBULATORY_CARE_PROVIDER_SITE_OTHER): Payer: BC Managed Care – PPO | Admitting: General Surgery

## 2022-03-04 ENCOUNTER — Encounter: Payer: Self-pay | Admitting: General Surgery

## 2022-03-04 ENCOUNTER — Other Ambulatory Visit: Payer: Self-pay

## 2022-03-04 VITALS — BP 103/72 | HR 84 | Temp 97.6°F | Resp 14 | Ht 64.0 in | Wt 129.0 lb

## 2022-03-04 DIAGNOSIS — Z09 Encounter for follow-up examination after completed treatment for conditions other than malignant neoplasm: Secondary | ICD-10-CM

## 2022-03-04 NOTE — Progress Notes (Signed)
Subjective:  ?  ? Paula Elliott  ?Patient here for postoperative visit, status post excision of omphalocele mesenteric duct cyst.  She is doing well.  She is pleased with the results.  Minimal incisional pain is noted. ?Objective:  ? ? BP 103/72   Pulse 84   Temp 97.6 ?F (36.4 ?C) (Other (Comment))   Resp 14   Ht '5\' 4"'$  (1.626 m)   Wt 129 lb (58.5 kg)   LMP  (LMP Unknown) Comment: hcg negative 02/22/2022  SpO2 99%   BMI 22.14 kg/m?  ? ?General:  alert, cooperative, and no distress  ?Abdomen soft, incision healing well. ?Final pathology consistent with diagnosis.  No malignancy seen. ?   ? ?Assessment:  ? ? Doing well postoperatively.  ?  ?Plan:  ? ?May increase activity as able.  Follow-up here as needed. ?

## 2022-03-12 DIAGNOSIS — F3181 Bipolar II disorder: Secondary | ICD-10-CM | POA: Diagnosis not present

## 2022-03-12 DIAGNOSIS — F900 Attention-deficit hyperactivity disorder, predominantly inattentive type: Secondary | ICD-10-CM | POA: Diagnosis not present

## 2022-03-12 DIAGNOSIS — F418 Other specified anxiety disorders: Secondary | ICD-10-CM | POA: Diagnosis not present

## 2022-03-18 ENCOUNTER — Ambulatory Visit: Payer: BC Managed Care – PPO | Admitting: Mental Health

## 2022-03-18 DIAGNOSIS — F900 Attention-deficit hyperactivity disorder, predominantly inattentive type: Secondary | ICD-10-CM

## 2022-03-18 DIAGNOSIS — F411 Generalized anxiety disorder: Secondary | ICD-10-CM

## 2022-03-25 NOTE — Progress Notes (Signed)
?    [facility] Counselor/Therapist Progress Note ? ? ?Patient ID: Paula Elliott,  ?MRN: [id number] ?Date:  03/18/21 ? ?Timespent: 58 minutes   ? ?Treatment Type: Individual therapy ?  ? ?Mental Status Exam: ?  ?   ?Appearance:    Casual, appropriate  ?Behavior:   WNL  ?Motor:   WNL  ?Speech/Language:    Clear and Coherent  ?Affect:   Full range  ?Mood:   Anxious, pleasant  ?Thought process:   normal  ?Thought content:     WNL, denies SI/HI  ?Sensory/Perceptual disturbances:     none  ?Orientation:   oriented to person, place and time/date  ?Attention:   Good  ?Concentration:   Good  ?Memory:   WNL  ?Fund of knowledge:    Good  ?Insight:     developing  ?Judgment:    good  ?Impulse Control:   good  ?  ?Reported Symptoms: Depressed mood most days, anxiety, rumination, hx of mood swings, crying spells  ?  ?Risk Assessment: ?Danger to Self:  No ?Self-injurious Behavior:  Positive history, none in the past year  ?Danger to Others: No ?Duty to Warn:no ?Physical Aggression / Violence:No  ?Access to Firearms a concern: No  ?Gang Involvement:No  ?Patient / guardian was educated about steps to take if suicide or homicide risk level increases between visits. ?While future psychiatric events cannot be accurately predicted, the patient does not currently require acute inpatient psychiatric care and does not currently meet [facility] involuntary commitment criteria. ?  ?Subjective:   ?Patient presents on time for today's session.   Patient continues to work at her present job, has moderate job satisfaction due to her recent pain increased but ultimately continues to identify the need to look at other options at some point but not presently.  She went on to share her distressful emotions associated with not having friends that are there for her, sharing some feelings related to a few of her past friends that abandon her particularly around one of the most difficult times in her life, when her father was having  health issues and then passed just months later.  The hurt caused by these friends, 1 in particular that she has known since childhood not reaching out at the time to provide any support being particularly hurtful also causing feelings of resentment.  Continue to work with patient on cognitive reframing related to negative, self-critical thinking style associated.  She continues to have ongoing difficulty acknowledging the positives in her life related to challenges with self acceptance. ? ? ?Interventions:  supportive therapy, strength based approaches, motivational interviewing ? ?Diagnosis: ?  ICD-10-CM   ?1. Generalized anxiety disorder  F41.1   ?  ?2. Attention deficit hyperactivity disorder (ADHD), predominantly inattentive type  F90.0   ?  ? ? ? ? ? ? ? ?Plan:   Patient to follow through with exercise regimen.  Patient to continue to be mindful of her dietary intake, ensuring that she is getting adequate nutrition.  Patient continued to journal as needed related to life stressors and the continual outlet needed related to the loss of her father.  Patient to continue to work on setting boundaries in relationships. ? ?Long-term goal:   ?Reduce overall level, frequency, and intensity of the feelings of depression and anxiety in  severity for at least 3 consecutive months. ?  ?Short-term goal:  ?Decrease "deflating, self-doubting" and catastrophizing thinking style ?Decrease anxiety producing self talk such as thinking of the worse  possible life outcomes ?Decrease panic episodes by utilizing coping skills discussed in session.  ?Patient to utilize coping as discussed in session, setting boundaries and relationship as needed, "saying no" as needed. ?Patient to take medications as prescribed and report any concerns to her prescribing provider ?  ?Assessment of progress:  progressing ? ? ?Anson [place], [facility] ? ?   ?

## 2022-03-31 ENCOUNTER — Ambulatory Visit: Payer: BC Managed Care – PPO | Admitting: Mental Health

## 2022-03-31 DIAGNOSIS — F411 Generalized anxiety disorder: Secondary | ICD-10-CM | POA: Diagnosis not present

## 2022-03-31 DIAGNOSIS — F900 Attention-deficit hyperactivity disorder, predominantly inattentive type: Secondary | ICD-10-CM | POA: Diagnosis not present

## 2022-03-31 DIAGNOSIS — Z634 Disappearance and death of family member: Secondary | ICD-10-CM | POA: Diagnosis not present

## 2022-03-31 NOTE — Progress Notes (Signed)
[facility] Counselor/Therapist Progress Note   Patient ID: Paula Elliott,  MRN: [id number] Date:  03/31/21  Timespent: 56 minutes    Treatment Type: Individual therapy    Mental Status Exam:      Appearance:    Casual, appropriate  Behavior:   WNL  Motor:   WNL  Speech/Language:    Clear and Coherent  Affect:   Full range  Mood:   Anxious, pleasant  Thought process:   normal  Thought content:     WNL, denies SI/HI  Sensory/Perceptual disturbances:     none  Orientation:   oriented to person, place and time/date  Attention:   Good  Concentration:   Good  Memory:   WNL  Fund of knowledge:    Good  Insight:     developing  Judgment:    good  Impulse Control:   good    Reported Symptoms: Depressed mood most days, anxiety, rumination, hx of mood swings, crying spells    Risk Assessment: Danger to Self:  No Self-injurious Behavior:  Positive history, none in the past year  Danger to Others: No Duty to Warn:no Physical Aggression / Violence:No  Access to Firearms a concern: No  Gang Involvement:No  Patient / guardian was educated about steps to take if suicide or homicide risk level increases between visits. While future psychiatric events cannot be accurately predicted, the patient does not currently require acute inpatient psychiatric care and does not currently meet [facility] involuntary commitment criteria.   Subjective:   Patient presents on time for today's session.   Progress toward goals as well as recent events were discussed.  Patient shared work experiences, overall feels more positive about her job due to getting a pay increase, albeit long-term she did not advise needing a different job that will support her more towards giving her independence.  She would like to find her own residence and move out of the home she shares with her mother.  Facilitated patient expressing progress related to moving through the grief process related to the loss of  her father.  She stated that she continues to struggle at times with allowing herself to experience the emotions associated, her tendency to suppress these feelings.  Facilitated her identifying further thoughts and feelings associated with this loss, she was able to identify memories that surfaced randomly throughout the day particularly when she is at home as many memories were shared their. Continue to work with patient on cognitive reframing related to negative, self-critical thinking style associated.     Interventions:  supportive therapy, strength based approaches, motivational interviewing  Diagnosis:   ICD-10-CM   1. Generalized anxiety disorder  F41.1     2. Attention deficit hyperactivity disorder (ADHD), predominantly inattentive type  F90.0     3. Bereavement  Z63.4         Plan:   Patient to follow through with exercise regimen.  Patient to continue to be mindful of her dietary intake, ensuring that she is getting adequate nutrition.  Patient continued to journal as needed related to life stressors and the continual outlet needed related to the loss of her father.  Patient to continue to work on setting boundaries in relationships.  Long-term goal:   Reduce overall level, frequency, and intensity of the feelings of depression and anxiety in  severity for at least 3 consecutive months.   Short-term goal:  Decrease "deflating, self-doubting" and catastrophizing thinking style Decrease anxiety producing self talk  such as thinking of the worse possible life outcomes Decrease panic episodes by utilizing coping skills discussed in session.  Patient to utilize coping as discussed in session, setting boundaries and relationship as needed, "saying no" as needed. Patient to take medications as prescribed and report any concerns to her prescribing provider   Assessment of progress:  progressing   Anson [place], [facility]

## 2022-04-15 ENCOUNTER — Ambulatory Visit (INDEPENDENT_AMBULATORY_CARE_PROVIDER_SITE_OTHER): Payer: BC Managed Care – PPO | Admitting: Mental Health

## 2022-04-15 DIAGNOSIS — F411 Generalized anxiety disorder: Secondary | ICD-10-CM

## 2022-04-15 DIAGNOSIS — F900 Attention-deficit hyperactivity disorder, predominantly inattentive type: Secondary | ICD-10-CM

## 2022-04-15 DIAGNOSIS — Z634 Disappearance and death of family member: Secondary | ICD-10-CM

## 2022-04-19 NOTE — Progress Notes (Signed)
[facility] Counselor/Therapist Progress Note   Patient ID: Paula Elliott,  MRN: [id number] Date:  04/15/22  Timespent: 56 minutes    Treatment Type: Individual therapy    Mental Status Exam:      Appearance:    Casual, appropriate  Behavior:   WNL  Motor:   WNL  Speech/Language:    Clear and Coherent  Affect:   Full range  Mood:   Anxious, pleasant  Thought process:   normal  Thought content:     WNL, denies SI/HI  Sensory/Perceptual disturbances:     none  Orientation:   oriented to person, place and time/date  Attention:   Good  Concentration:   Good  Memory:   WNL  Fund of knowledge:    Good  Insight:     developing  Judgment:    good  Impulse Control:   good    Reported Symptoms: Depressed mood most days, anxiety, rumination, hx of mood swings, crying spells    Risk Assessment: Danger to Self:  No Self-injurious Behavior:  Positive history, none in the past year  Danger to Others: No Duty to Warn:no Physical Aggression / Violence:No  Access to Firearms a concern: No  Gang Involvement:No  Patient / guardian was educated about steps to take if suicide or homicide risk level increases between visits. While future psychiatric events cannot be accurately predicted, the patient does not currently require acute inpatient psychiatric care and does not currently meet [facility] involuntary commitment criteria.   Subjective:   Patient presents on time for today's session. Patient shared how she has had a difficult week, beginning on mother's Day. She stated that the difficulty started specifically when her mother began viewing videos of her father. This is very upsetting to patient, albeit she states that they have done this before. It appears this was even more difficult due to it being the first mother's Day without her father also present. She recalled things he would do for her on mother's Day and now patient states that she is doing some of them; it  was identified that this was a further catalyst to her further understanding her lack of going through the grief process. She Paula Elliott how she is difficulty allowing herself to show emotion, through guided discovery, she further identifying how she tends to suppress emotion for many years.  Provide support and encouragement toward allowing Time to embrace her feelings through not only the journaling that she engages, but also with emotional expression.   Interventions:  supportive therapy, strength based approaches, motivational interviewing  Diagnosis:   ICD-10-CM   1. Generalized anxiety disorder  F41.1     2. Attention deficit hyperactivity disorder (ADHD), predominantly inattentive type  F90.0     3. Bereavement  Z63.4         Plan:   Patient to follow through with exercise regimen.  Patient to continue to be mindful of her dietary intake, ensuring that she is getting adequate nutrition.  Patient continued to journal as needed related to life stressors and the continual outlet needed related to the loss of her father.  Patient to continue to work on setting boundaries in relationships.  Long-term goal:   Reduce overall level, frequency, and intensity of the feelings of depression and anxiety in  severity for at least 3 consecutive months.   Short-term goal:  Decrease "deflating, self-doubting" and catastrophizing thinking style Decrease anxiety producing self talk such as thinking of the worse possible  life outcomes Decrease panic episodes by utilizing coping skills discussed in session.  Patient to utilize coping as discussed in session, setting boundaries and relationship as needed, "saying no" as needed. Patient to take medications as prescribed and report any concerns to her prescribing provider   Assessment of progress:  progressing   Anson [place], [facility]

## 2022-05-06 ENCOUNTER — Ambulatory Visit (INDEPENDENT_AMBULATORY_CARE_PROVIDER_SITE_OTHER): Payer: BC Managed Care – PPO | Admitting: Mental Health

## 2022-05-06 DIAGNOSIS — F411 Generalized anxiety disorder: Secondary | ICD-10-CM | POA: Diagnosis not present

## 2022-05-06 NOTE — Progress Notes (Signed)
[facility] Counselor/Therapist Progress Note   Patient ID: Paula Elliott,  MRN: [id number] Date:  05/06/22  Timespent: 56 minutes    Treatment Type: Individual therapy    Mental Status Exam:      Appearance:    Casual, appropriate  Behavior:   WNL  Motor:   WNL  Speech/Language:    Clear and Coherent  Affect:   Full range  Mood:   Anxious, pleasant  Thought process:   normal  Thought content:     WNL, denies SI/HI  Sensory/Perceptual disturbances:     none  Orientation:   oriented to person, place and time/date  Attention:   Good  Concentration:   Good  Memory:   WNL  Fund of knowledge:    Good  Insight:     developing  Judgment:    good  Impulse Control:   good    Reported Symptoms: Depressed mood most days, anxiety, rumination, hx of mood swings, crying spells    Risk Assessment: Danger to Self:  No Self-injurious Behavior:  Positive history, none in the past year  Danger to Others: No Duty to Warn:no Physical Aggression / Violence:No  Access to Firearms a concern: No  Gang Involvement:No  Patient / guardian was educated about steps to take if suicide or homicide risk level increases between visits. While future psychiatric events cannot be accurately predicted, the patient does not currently require acute inpatient psychiatric care and does not currently meet [facility] involuntary commitment criteria.   Subjective:   Patient presents on time for today's session.    Patient shared how she has had a difficult week, beginning on mother's Day. She stated that the difficulty started specifically when her mother began viewing videos of her father. This is very upsetting to patient, albeit she states that they have done this before. It appears this was even more difficult due to it being the first mother's Day without her father also present. She recalled things he would do for her on mother's Day and now patient states that she is doing some of them;  it was identified that this was a further catalyst to her further understanding her lack of going through the grief process. She [name] how she is difficulty allowing herself to show emotion, through guided discovery, she further identifying how she tends to suppress emotion for many years.  Provide support and encouragement toward allowing Time to embrace her feelings through not only the journaling that she engages, but also with emotional expression.   Interventions:  supportive therapy, strength based approaches, motivational interviewing  Diagnosis: No diagnosis found.     Plan:   Patient to follow through with exercise regimen.  Patient to continue to be mindful of her dietary intake, ensuring that she is getting adequate nutrition.  Patient continued to journal as needed related to life stressors and the continual outlet needed related to the loss of her father.  Patient to continue to work on setting boundaries in relationships.  Long-term goal:   Reduce overall level, frequency, and intensity of the feelings of depression and anxiety in  severity for at least 3 consecutive months.   Short-term goal:  Decrease "deflating, self-doubting" and catastrophizing thinking style Decrease anxiety producing self talk such as thinking of the worse possible life outcomes Decrease panic episodes by utilizing coping skills discussed in session.  Patient to utilize coping as discussed in session, setting boundaries and relationship as needed, "saying no" as needed. Patient  to take medications as prescribed and report any concerns to her prescribing provider   Assessment of progress:  progressing   Anson [place], [facility]

## 2022-05-07 DIAGNOSIS — F418 Other specified anxiety disorders: Secondary | ICD-10-CM | POA: Diagnosis not present

## 2022-05-07 DIAGNOSIS — F900 Attention-deficit hyperactivity disorder, predominantly inattentive type: Secondary | ICD-10-CM | POA: Diagnosis not present

## 2022-05-07 DIAGNOSIS — F3181 Bipolar II disorder: Secondary | ICD-10-CM | POA: Diagnosis not present

## 2022-05-19 ENCOUNTER — Ambulatory Visit: Payer: BC Managed Care – PPO | Admitting: Mental Health

## 2022-06-02 ENCOUNTER — Ambulatory Visit: Payer: BC Managed Care – PPO | Admitting: Mental Health

## 2022-06-10 ENCOUNTER — Ambulatory Visit (INDEPENDENT_AMBULATORY_CARE_PROVIDER_SITE_OTHER): Payer: BC Managed Care – PPO | Admitting: Mental Health

## 2022-06-10 DIAGNOSIS — F411 Generalized anxiety disorder: Secondary | ICD-10-CM | POA: Diagnosis not present

## 2022-06-10 NOTE — Progress Notes (Signed)
[facility] Counselor/Therapist Progress Note   Patient ID: Paula Elliott,  MRN: [id number] Date:  06/10/22  Timespent: 56 minutes    Treatment Type: Individual therapy    Mental Status Exam:      Appearance:    Casual, appropriate  Behavior:   WNL  Motor:   WNL  Speech/Language:    Clear and Coherent  Affect:   Full range  Mood:   Anxious, pleasant  Thought process:   normal  Thought content:     WNL, denies SI/HI  Sensory/Perceptual disturbances:     none  Orientation:   oriented to person, place and time/date  Attention:   Good  Concentration:   Good  Memory:   WNL  Fund of knowledge:    Good  Insight:     developing  Judgment:    good  Impulse Control:   good    Reported Symptoms: Depressed mood most days, anxiety, rumination, hx of mood swings, crying spells    Risk Assessment: Danger to Self:  No Self-injurious Behavior:  Positive history, none in the past year  Danger to Others: No Duty to Warn:no Physical Aggression / Violence:No  Access to Firearms a concern: No  Gang Involvement:No  Patient / guardian was educated about steps to take if suicide or homicide risk level increases between visits. While future psychiatric events cannot be accurately predicted, the patient does not currently require acute inpatient psychiatric care and does not currently meet [facility] involuntary commitment criteria.   Subjective:   Patient presents on time for today's session.  Facilitated patient sharing recent events in progress.  She stated the anniversary of her father's passing is approaching and time was spent to allow her to continue to share thoughts and feelings related to the loss of her father as well as how to cope as this anniversary approaches.  She shared how she recently came back in contact with a man that she had dated a few months ago, this relationship not continuing due to her continuing to work through the grief process related to the loss  of her father as well as his own issues at that time.  Facilitated her identifying self supportive thoughts as she continues to struggle with confidence in relationships, specifically with that of dating.  Being able to be assertive with her communication was also identified as a challenge.  Interventions:  supportive therapy, strength based approaches, motivational interviewing  Diagnosis:   ICD-10-CM   1. Generalized anxiety disorder  F41.1         Plan:   Patient to follow through with exercise regimen.  Patient to continue to be mindful of her dietary intake, ensuring that she is getting adequate nutrition.  Patient continued to journal as needed related to life stressors and the continual outlet needed related to the loss of her father.  Patient to continue to work on setting boundaries in relationships.  Long-term goal:   Reduce overall level, frequency, and intensity of the feelings of depression and anxiety in  severity for at least 3 consecutive months.   Short-term goal:  Decrease "deflating, self-doubting" and catastrophizing thinking style Decrease anxiety producing self talk such as thinking of the worse possible life outcomes Decrease panic episodes by utilizing coping skills discussed in session.  Patient to utilize coping as discussed in session, setting boundaries and relationship as needed, "saying no" as needed. Patient to take medications as prescribed and report any concerns to her prescribing provider  Assessment of progress:  progressing   Anson [place], [facility]

## 2022-06-23 ENCOUNTER — Ambulatory Visit (INDEPENDENT_AMBULATORY_CARE_PROVIDER_SITE_OTHER): Payer: BC Managed Care – PPO | Admitting: Mental Health

## 2022-06-23 DIAGNOSIS — F411 Generalized anxiety disorder: Secondary | ICD-10-CM | POA: Diagnosis not present

## 2022-06-23 DIAGNOSIS — F900 Attention-deficit hyperactivity disorder, predominantly inattentive type: Secondary | ICD-10-CM

## 2022-06-24 NOTE — Progress Notes (Signed)
[facility] Counselor/Therapist Progress Note   Patient ID: Paula Elliott,  MRN: [id number] Date: 06/22/22  Timespent: 57 minutes    Treatment Type: Individual therapy    Mental Status Exam:      Appearance:    Casual, appropriate  Behavior:   WNL  Motor:   WNL  Speech/Language:    Clear and Coherent  Affect:   Full range  Mood:   Anxious, pleasant  Thought process:   normal  Thought content:     WNL, denies SI/HI  Sensory/Perceptual disturbances:     none  Orientation:   oriented to person, place and time/date  Attention:   Good  Concentration:   Good  Memory:   WNL  Fund of knowledge:    Good  Insight:     developing  Judgment:    good  Impulse Control:   good    Reported Symptoms: Depressed mood most days, anxiety, rumination, hx of mood swings, crying spells    Risk Assessment: Danger to Self:  No Self-injurious Behavior:  Positive history, none in the past year  Danger to Others: No Duty to Warn:no Physical Aggression / Violence:No  Access to Firearms a concern: No  Gang Involvement:No  Patient / guardian was educated about steps to take if suicide or homicide risk level increases between visits. While future psychiatric events cannot be accurately predicted, the patient does not currently require acute inpatient psychiatric care and does not currently meet [facility] involuntary commitment criteria.   Subjective:   Patient presents on time for today's session.  Assessed recent events and progress.  She shared details related to how she and her mother coped recently as it was the 1 year anniversary of her father's passing.  She stated that they were supportive of one another, spending time together throughout the entire day, patient stating that she tends to often suppress her emotions around her mother but also other people, finding herself later in the evening tearful as she was experiencing continued pain of this loss.  We continue to  encourage journaling to further express her suppressed feelings.  She continues to communicate with the young man with whom she would like to eventually have more of a dating relationship.  She shared how she has no history beyond just a few weeks or so of dating, therefore, she often finds herself questioning if she said the "right thing", regards herself as more passive and how he shares this trait as well, which plays a role in them not spending time together on weekends, which she would like to occur.  Explored collaboratively ways to communicate, to express some thoughts and feelings she has that might allow the relationship to further involve.     Interventions:  supportive therapy, strength based approaches, motivational interviewing  Diagnosis:   ICD-10-CM   1. Generalized anxiety disorder  F41.1     2. Attention deficit hyperactivity disorder (ADHD), predominantly inattentive type  F90.0          Plan:   Patient to follow through with exercise regimen.  Patient to continue to be mindful of her dietary intake, ensuring that she is getting adequate nutrition.  Patient continued to journal as needed related to life stressors and the continual outlet needed related to the loss of her father.  Patient to continue to work on setting boundaries in relationships.  Long-term goal:   Reduce overall level, frequency, and intensity of the feelings of depression and anxiety in  severity for at least 3 consecutive months.   Short-term goal:  Decrease "deflating, self-doubting" and catastrophizing thinking style Decrease anxiety producing self talk such as thinking of the worse possible life outcomes Decrease panic episodes by utilizing coping skills discussed in session.  Patient to utilize coping as discussed in session, setting boundaries and relationship as needed, "saying no" as needed. Patient to take medications as prescribed and report any concerns to her prescribing provider    Assessment of progress:  progressing   Anson [place], [facility]

## 2022-07-14 ENCOUNTER — Ambulatory Visit: Payer: BC Managed Care – PPO | Admitting: Mental Health

## 2022-07-28 ENCOUNTER — Ambulatory Visit: Payer: BC Managed Care – PPO | Admitting: Mental Health

## 2022-07-30 DIAGNOSIS — F3181 Bipolar II disorder: Secondary | ICD-10-CM | POA: Diagnosis not present

## 2022-07-30 DIAGNOSIS — F900 Attention-deficit hyperactivity disorder, predominantly inattentive type: Secondary | ICD-10-CM | POA: Diagnosis not present

## 2022-07-30 DIAGNOSIS — F418 Other specified anxiety disorders: Secondary | ICD-10-CM | POA: Diagnosis not present

## 2022-08-04 ENCOUNTER — Ambulatory Visit: Payer: BC Managed Care – PPO | Admitting: Mental Health

## 2022-08-13 DIAGNOSIS — F900 Attention-deficit hyperactivity disorder, predominantly inattentive type: Secondary | ICD-10-CM | POA: Diagnosis not present

## 2022-08-13 DIAGNOSIS — F418 Other specified anxiety disorders: Secondary | ICD-10-CM | POA: Diagnosis not present

## 2022-08-13 DIAGNOSIS — F3181 Bipolar II disorder: Secondary | ICD-10-CM | POA: Diagnosis not present

## 2022-08-18 ENCOUNTER — Ambulatory Visit (INDEPENDENT_AMBULATORY_CARE_PROVIDER_SITE_OTHER): Payer: BC Managed Care – PPO | Admitting: Mental Health

## 2022-08-18 DIAGNOSIS — F411 Generalized anxiety disorder: Secondary | ICD-10-CM | POA: Diagnosis not present

## 2022-08-18 NOTE — Progress Notes (Addendum)
[facility] Counselor/Therapist Progress Note   Patient ID: Paula Elliott,  MRN: [id number] Date: 08/18/22  Timespent: 57 minutes    Treatment Type: Individual therapy    Mental Status Exam:      Appearance:    Casual, appropriate  Behavior:   WNL  Motor:   WNL  Speech/Language:    Clear and Coherent  Affect:   Full range  Mood:   Anxious, pleasant  Thought process:   normal  Thought content:     WNL, denies SI/HI  Sensory/Perceptual disturbances:     none  Orientation:   oriented to person, place and time/date  Attention:   Good  Concentration:   Good  Memory:   WNL  Fund of knowledge:    Good  Insight:     developing  Judgment:    good  Impulse Control:   good    Reported Symptoms: Depressed mood most days, anxiety, rumination, hx of mood swings, crying spells    Risk Assessment: Danger to Self:  No Self-injurious Behavior:  Positive history, none in the past year  Danger to Others: No Duty to Warn:no Physical Aggression / Violence:No  Access to Firearms a concern: No  Gang Involvement:No  Patient / guardian was educated about steps to take if suicide or homicide risk level increases between visits. While future psychiatric events cannot be accurately predicted, the patient does not currently require acute inpatient psychiatric care and does not currently meet [facility] involuntary commitment criteria.   Subjective:   Patient presents on time for today's session.  Assessed progress, recent events.  Patient shared how she continues to work her full-time job and is generally satisfied currently.  She went on to focus primarily on the relationship she has with her current boyfriend.  She shared how they have grown closer over the past few months, however, she is not trying to hurry the relationship and how they both enjoy just being able to hang out.  She went on to share anxiety related to the relationship in terms of at some point is meeting her  family.  Patient shared how she tends to ruminate and focused on the "what if".  Assisted her in reframing in session and we explored ways she continue to work on this between sessions.  She shared the relationship with her mother has continued to go well as they both continue to adjust to the loss of her father.  Patient wants to work on her irritability which can manifest at times with her "snapping" verbally at her mother when frustrated about day-to-day issues that are not significant.   Interventions:  supportive therapy, strength based approaches, motivational interviewing  Diagnosis:   ICD-10-CM   1. Generalized anxiety disorder  F41.1           Plan:   Patient to follow through with exercise regimen.  Patient to continue to be mindful of her dietary intake, ensuring that she is getting adequate nutrition.  Patient continued to journal as needed related to life stressors and the continual outlet needed related to the loss of her father.  Patient to continue to work on setting boundaries in relationships.  Long-term goal:   Reduce overall level, frequency, and intensity of the feelings of depression and anxiety in  severity for at least 3 consecutive months.   Short-term goal:  Decrease "deflating, self-doubting" and catastrophizing thinking style Decrease anxiety producing self talk such as thinking of the worse possible life outcomes  Decrease panic episodes by utilizing coping skills discussed in session.  Patient to utilize coping as discussed in session, setting boundaries and relationship as needed, "saying no" as needed. Patient to take medications as prescribed and report any concerns to her prescribing provider   Assessment of progress:  progressing   Anson [place], [facility]

## 2022-08-26 DIAGNOSIS — R110 Nausea: Secondary | ICD-10-CM | POA: Diagnosis not present

## 2022-08-26 DIAGNOSIS — R1084 Generalized abdominal pain: Secondary | ICD-10-CM | POA: Diagnosis not present

## 2022-08-29 ENCOUNTER — Other Ambulatory Visit: Payer: Self-pay | Admitting: Adult Health

## 2022-09-01 ENCOUNTER — Ambulatory Visit (INDEPENDENT_AMBULATORY_CARE_PROVIDER_SITE_OTHER): Payer: BC Managed Care – PPO | Admitting: Mental Health

## 2022-09-01 DIAGNOSIS — F411 Generalized anxiety disorder: Secondary | ICD-10-CM | POA: Diagnosis not present

## 2022-09-01 NOTE — Progress Notes (Signed)
[facility] Counselor/Therapist Progress Note   Patient ID: Paula Elliott,  MRN: [id number] Date: 09/01/22  Timespent: 55 minutes    Treatment Type: Individual therapy    Mental Status Exam:      Appearance:    Casual, appropriate  Behavior:   WNL  Motor:   WNL  Speech/Language:    Clear and Coherent  Affect:   Full range  Mood:   Anxious, pleasant  Thought process:   normal  Thought content:     WNL, denies SI/HI  Sensory/Perceptual disturbances:     none  Orientation:   oriented to person, place and time/date  Attention:   Good  Concentration:   Good  Memory:   WNL  Fund of knowledge:    Good  Insight:     developing  Judgment:    good  Impulse Control:   good    Reported Symptoms: Depressed mood most days, anxiety, rumination, hx of mood swings, crying spells    Risk Assessment: Danger to Self:  No Self-injurious Behavior:  Positive history, none in the past year  Danger to Others: No Duty to Warn:no Physical Aggression / Violence:No  Access to Firearms a concern: No  Gang Involvement:No  Patient / guardian was educated about steps to take if suicide or homicide risk level increases between visits. While future psychiatric events cannot be accurately predicted, the patient does not currently require acute inpatient psychiatric care and does not currently meet [facility] involuntary commitment criteria.   Subjective:   Patient presents on time for today's session.  Assess progress.  She stated that she continues to cope with anxiety daily.  Went on to share various examples of her tendency to ruminate.  Examples ranged from experiences at work and at home and with her boyfriend.  Facilitated her identifying thoughts and worked with her to reframe.  She continues the relationship with her boyfriend, this being her first relationship she expressed doubt, many thoughts related to her anxiety.  Through guided discovery, she plans on working to taking  chances even though it causes anxiety because she wants the relationship to flourish.  She plans to spend time with him and some of her friends this weekend, this being another new step for her in the relationship.    Interventions:  supportive therapy, strength based approaches, motivational interviewing  Diagnosis:   ICD-10-CM   1. Generalized anxiety disorder  F41.1        Plan:   Patient to follow through with exercise regimen.  Patient to continue to be mindful of her dietary intake, ensuring that she is getting adequate nutrition.  Patient continued to journal as needed related to life stressors and the continual outlet needed related to the loss of her father.  Patient to continue to work on setting boundaries in relationships.  Long-term goal:   Reduce overall level, frequency, and intensity of the feelings of depression and anxiety in  severity for at least 3 consecutive months.   Short-term goal:  Decrease "deflating, self-doubting" and catastrophizing thinking style Decrease anxiety producing self talk such as thinking of the worse possible life outcomes Decrease panic episodes by utilizing coping skills discussed in session.  Patient to utilize coping as discussed in session, setting boundaries and relationship as needed, "saying no" as needed. Patient to take medications as prescribed and report any concerns to her prescribing provider   Assessment of progress:  progressing   Anson [place], [facility]

## 2022-09-09 DIAGNOSIS — F900 Attention-deficit hyperactivity disorder, predominantly inattentive type: Secondary | ICD-10-CM | POA: Diagnosis not present

## 2022-09-09 DIAGNOSIS — F418 Other specified anxiety disorders: Secondary | ICD-10-CM | POA: Diagnosis not present

## 2022-09-09 DIAGNOSIS — F3181 Bipolar II disorder: Secondary | ICD-10-CM | POA: Diagnosis not present

## 2022-09-15 ENCOUNTER — Ambulatory Visit (INDEPENDENT_AMBULATORY_CARE_PROVIDER_SITE_OTHER): Payer: BC Managed Care – PPO | Admitting: Mental Health

## 2022-09-15 DIAGNOSIS — F411 Generalized anxiety disorder: Secondary | ICD-10-CM

## 2022-09-15 NOTE — Progress Notes (Signed)
[facility] Counselor/Therapist Progress Note   Patient ID: Paula Elliott,  MRN: [id number] Date: 09/15/22  Timespent: 54 minutes    Treatment Type: Individual therapy    Mental Status Exam:      Appearance:    Casual, appropriate  Behavior:   WNL  Motor:   WNL  Speech/Language:    Clear and Coherent  Affect:   Full range  Mood:   Anxious, pleasant  Thought process:   normal  Thought content:     WNL, denies SI/HI  Sensory/Perceptual disturbances:     none  Orientation:   oriented to person, place and time/date  Attention:   Good  Concentration:   Good  Memory:   WNL  Fund of knowledge:    Good  Insight:     developing  Judgment:    good  Impulse Control:   good    Reported Symptoms: Depressed mood most days, anxiety, rumination, hx of mood swings, crying spells    Risk Assessment: Danger to Self:  No Self-injurious Behavior:  Positive history, none in the past year  Danger to Others: No Duty to Warn:no Physical Aggression / Violence:No  Access to Firearms a concern: No  Gang Involvement:No  Patient / guardian was educated about steps to take if suicide or homicide risk level increases between visits. While future psychiatric events cannot be accurately predicted, the patient does not currently require acute inpatient psychiatric care and does not currently meet [facility] involuntary commitment criteria.   Subjective:   Patient presents on time for today's session.  Patient shared progress, focusing primarily the session around work related issues.  At this point, she continues to be underpaid in meeting her longer term plans of having more independence being able to move out of her mother's home at some point.  She shared how she is often over tasked due to her diligent work ethic but at this point, does not see a path forward toward promotions further than her current position due to the limited size of the company as well as its pattern of hiring  internal family members to fill positions.  She identified the need to begin her job search again, although she is uncertain of what career area to focus on.  Encouraged her to set aside some time to identify her natural skills and talent as well as other personal characteristics and making this decision while also considering other factors.  She continues to have worry about her future, making decisions and and self invalidation.  She reports some struggle in the relationship with her boyfriend, concerned about his commitment to what they have at this point for plans to further discuss next session.   Interventions:  supportive therapy, strength based approaches, motivational interviewing  Diagnosis:   ICD-10-CM   1. Generalized anxiety disorder  F41.1         Plan:   Patient to follow through with exercise regimen.  Patient to continue to be mindful of her dietary intake, ensuring that she is getting adequate nutrition.  Patient continued to journal as needed related to life stressors and the continual outlet needed related to the loss of her father.  Patient to continue to work on setting boundaries in relationships.  Long-term goal:   Reduce overall level, frequency, and intensity of the feelings of depression and anxiety in  severity for at least 3 consecutive months.   Short-term goal:  Decrease "deflating, self-doubting" and catastrophizing thinking style Decrease anxiety  producing self talk such as thinking of the worse possible life outcomes Decrease panic episodes by utilizing coping skills discussed in session.  Patient to utilize coping as discussed in session, setting boundaries and relationship as needed, "saying no" as needed. Patient to take medications as prescribed and report any concerns to her prescribing provider   Assessment of progress:  progressing   Anson [place], [facility]

## 2022-09-29 ENCOUNTER — Ambulatory Visit (INDEPENDENT_AMBULATORY_CARE_PROVIDER_SITE_OTHER): Payer: BC Managed Care – PPO | Admitting: Mental Health

## 2022-09-29 DIAGNOSIS — F900 Attention-deficit hyperactivity disorder, predominantly inattentive type: Secondary | ICD-10-CM

## 2022-09-29 DIAGNOSIS — F411 Generalized anxiety disorder: Secondary | ICD-10-CM | POA: Diagnosis not present

## 2022-09-29 NOTE — Progress Notes (Signed)
[facility] Counselor/Therapist Progress Note   Patient ID: Paula Elliott,  MRN: [id number] Date: 09/29/22  Timespent: 54 minutes    Treatment Type: Individual therapy    Mental Status Exam:      Appearance:    Casual, appropriate  Behavior:   WNL  Motor:   WNL  Speech/Language:    Clear and Coherent  Affect:   Full range  Mood:   Anxious, pleasant  Thought process:   normal  Thought content:     WNL, denies SI/HI  Sensory/Perceptual disturbances:     none  Orientation:   oriented to person, place and time/date  Attention:   Good  Concentration:   Good  Memory:   WNL  Fund of knowledge:    Good  Insight:     developing  Judgment:    good  Impulse Control:   good    Reported Symptoms: Depressed mood most days, anxiety, rumination, hx of mood swings, crying spells    Risk Assessment: Danger to Self:  No Self-injurious Behavior:  Positive history, none in the past year  Danger to Others: No Duty to Warn:no Physical Aggression / Violence:No  Access to Firearms a concern: No  Gang Involvement:No  Patient / guardian was educated about steps to take if suicide or homicide risk level increases between visits. While future psychiatric events cannot be accurately predicted, the patient does not currently require acute inpatient psychiatric care and does not currently meet [facility] involuntary commitment criteria.   Subjective:   Patient presents on time for today's session.  Patient shared progress, focusing primarily the session around work related issues.  At this point, she continues to be underpaid in meeting her longer term plans of having more independence being able to move out of her mother's home at some point.  She shared how she is often over tasked due to her diligent work ethic but at this point, does not see a path forward toward promotions further than her current position due to the limited size of the company as well as its pattern of hiring  internal family members to fill positions.  She identified the need to begin her job search again, although she is uncertain of what career area to focus on.  Encouraged her to set aside some time to identify her natural skills and talent as well as other personal characteristics and making this decision while also considering other factors.  She continues to have worry about her future, making decisions and and self invalidation.  She reports some struggle in the relationship with her boyfriend, concerned about his commitment to what they have at this point for plans to further discuss next session.   Interventions:  supportive therapy, strength based approaches, motivational interviewing  Diagnosis:   ICD-10-CM   1. Generalized anxiety disorder  F41.1     2. Attention deficit hyperactivity disorder (ADHD), predominantly inattentive type  F90.0          Plan:   Patient to follow through with exercise regimen.  Patient to continue to be mindful of her dietary intake, ensuring that she is getting adequate nutrition.  Patient continued to journal as needed related to life stressors and the continual outlet needed related to the loss of her father.  Patient to continue to work on setting boundaries in relationships.  Long-term goal:   Reduce overall level, frequency, and intensity of the feelings of depression and anxiety in  severity for at least 3  consecutive months.   Short-term goal:  Decrease "deflating, self-doubting" and catastrophizing thinking style Decrease anxiety producing self talk such as thinking of the worse possible life outcomes Decrease panic episodes by utilizing coping skills discussed in session.  Patient to utilize coping as discussed in session, setting boundaries and relationship as needed, "saying no" as needed. Patient to take medications as prescribed and report any concerns to her prescribing provider   Assessment of progress:  progressing   Anson [place], [facility]

## 2022-10-07 DIAGNOSIS — F3181 Bipolar II disorder: Secondary | ICD-10-CM | POA: Diagnosis not present

## 2022-10-07 DIAGNOSIS — F900 Attention-deficit hyperactivity disorder, predominantly inattentive type: Secondary | ICD-10-CM | POA: Diagnosis not present

## 2022-10-07 DIAGNOSIS — F418 Other specified anxiety disorders: Secondary | ICD-10-CM | POA: Diagnosis not present

## 2022-10-11 ENCOUNTER — Ambulatory Visit (INDEPENDENT_AMBULATORY_CARE_PROVIDER_SITE_OTHER): Payer: BC Managed Care – PPO | Admitting: Mental Health

## 2022-10-11 DIAGNOSIS — F411 Generalized anxiety disorder: Secondary | ICD-10-CM | POA: Diagnosis not present

## 2022-10-11 DIAGNOSIS — F900 Attention-deficit hyperactivity disorder, predominantly inattentive type: Secondary | ICD-10-CM

## 2022-10-11 NOTE — Progress Notes (Signed)
[facility] Counselor/Therapist Progress Note   Patient ID: Paula Elliott,  MRN: [id number] Date: 10/11/22  Timespent: 56 minutes    Treatment Type: Individual therapy    Mental Status Exam:      Appearance:    Casual, appropriate  Behavior:   WNL  Motor:   WNL  Speech/Language:    Clear and Coherent  Affect:   Full range  Mood:   Anxious, pleasant  Thought process:   normal  Thought content:     WNL, denies SI/HI  Sensory/Perceptual disturbances:     none  Orientation:   oriented to person, place and time/date  Attention:   Good  Concentration:   Good  Memory:   WNL  Fund of knowledge:    Good  Insight:     developing  Judgment:    good  Impulse Control:   good    Reported Symptoms: Depressed mood most days, anxiety, rumination, hx of mood swings, crying spells    Risk Assessment: Danger to Self:  No Self-injurious Behavior:  Positive history, none in the past year  Danger to Others: No Duty to Warn:no Physical Aggression / Violence:No  Access to Firearms a concern: No  Gang Involvement:No  Patient / guardian was educated about steps to take if suicide or homicide risk level increases between visits. While future psychiatric events cannot be accurately predicted, the patient does not currently require acute inpatient psychiatric care and does not currently meet [facility] involuntary commitment criteria.   Subjective:   Patient presents on time for today's session.  Assess progress.  Patient focused on work as she had some good news.  She stated she received a $2 raise today which potentially allows her to be able to obtain her own residence and move out of her mother's home.  She stated she plans to search for options, likely closer to her job.  She shared experiences, her diligence at work, keeping up with multiple tasks.  Facilitated her identifying personal strengths which have resulted in her earning this achievement; facilitated patient  contrasting this against her history of maintaining self-depreciating statements about herself, abilities excetra.  Continued this throughout the session and encouraged her to continue between visits.  She considers working at [employer] for an extended period of time with the hope with potential promotion, albeit she is not optimistic about this possibility.  Provided support and encouragement toward her keeping this idea open.    Interventions:  supportive therapy, strength based approaches, motivational interviewing  Diagnosis:   ICD-10-CM   1. Generalized anxiety disorder  F41.1     2. Attention deficit hyperactivity disorder (ADHD), predominantly inattentive type  F90.0          Plan:   Patient to follow through with exercise regimen.  Patient to continue to be mindful of her dietary intake, ensuring that she is getting adequate nutrition.  Patient continued to journal as needed related to life stressors and the continual outlet needed related to the loss of her father.  Patient to continue to work on setting boundaries in relationships.  Long-term goal:   Reduce overall level, frequency, and intensity of the feelings of depression and anxiety in  severity for at least 3 consecutive months.   Short-term goal:  Decrease "deflating, self-doubting" and catastrophizing thinking style Decrease anxiety producing self talk such as thinking of the worse possible life outcomes Decrease panic episodes by utilizing coping skills discussed in session.  Patient to utilize  coping as discussed in session, setting boundaries and relationship as needed, "saying no" as needed. Patient to take medications as prescribed and report any concerns to her prescribing provider   Assessment of progress:  progressing   Anson [place], [facility]

## 2022-10-15 DIAGNOSIS — S42001D Fracture of unspecified part of right clavicle, subsequent encounter for fracture with routine healing: Secondary | ICD-10-CM | POA: Diagnosis not present

## 2022-10-27 ENCOUNTER — Ambulatory Visit: Payer: BC Managed Care – PPO | Admitting: Mental Health

## 2022-11-10 ENCOUNTER — Ambulatory Visit: Payer: BC Managed Care – PPO | Admitting: Mental Health

## 2022-11-10 DIAGNOSIS — F411 Generalized anxiety disorder: Secondary | ICD-10-CM

## 2022-11-10 NOTE — Progress Notes (Signed)
[facility] Counselor/Therapist Progress Note   Patient ID: Paula Elliott,  MRN: [id number] Date: 11/10/22  Timespent: 55 minutes    Treatment Type: Individual therapy    Mental Status Exam:      Appearance:    Casual, appropriate  Behavior:   WNL  Motor:   WNL  Speech/Language:    Clear and Coherent  Affect:   Full range  Mood:   Euthymic, pleasant  Thought process:   normal  Thought content:     WNL, denies SI/HI  Sensory/Perceptual disturbances:     none  Orientation:   oriented to person, place and time/date  Attention:   Good  Concentration:   Good  Memory:   WNL  Fund of knowledge:    Good  Insight:     developing  Judgment:    good  Impulse Control:   good    Reported Symptoms: Depressed mood most days, anxiety, rumination, hx of mood swings, crying spells    Risk Assessment: Danger to Self:  No Self-injurious Behavior:  Positive history, none in the past year  Danger to Others: No Duty to Warn:no Physical Aggression / Violence:No  Access to Firearms a concern: No  Gang Involvement:No  Patient / guardian was educated about steps to take if suicide or homicide risk level increases between visits. While future psychiatric events cannot be accurately predicted, the patient does not currently require acute inpatient psychiatric care and does not currently meet [facility] involuntary commitment criteria.   Subjective:   Patient presents on time for today's session.  Patient shared how she and her mother lost their family pet dog passed away about a week ago.  She went on to share how she is trying to lose weight, would like to lose 10 pounds although, per her BMI she knows that she is within the normal weight range, however, is not satisfied with her body image and this is her motivation.  She states she continues to eat healthy diet, some changes she identified she can make on weekends, with both her diet and exercise regimen.  She went on to  share stress at work, feeling a sense of being "burned out".  Explored with patient further, providing support and assisting her in clarifying feelings and identifying stressors.  Facilitated also her reframing some thoughts associated.  She is looking forward to her Christmas break which consists of about 4 days off of work which she feels are needed at this point.  Continue to work with patient from a cognitive behavioral framework.    Interventions:  supportive therapy, strength based approaches, motivational interviewing, CBT  Diagnosis: No diagnosis found.      Plan:   Patient to follow through with exercise regimen.  Patient to continue to be mindful of her dietary intake, ensuring that she is getting adequate nutrition.  Patient continued to journal as needed related to life stressors and the continual outlet needed related to the loss of her father.  Patient to continue to work on setting boundaries in relationships.  Long-term goal:   Reduce overall level, frequency, and intensity of the feelings of depression and anxiety in  severity for at least 3 consecutive months.   Short-term goal:  Decrease "deflating, self-doubting" and catastrophizing thinking style Decrease anxiety producing self talk such as thinking of the worse possible life outcomes Decrease panic episodes by utilizing coping skills discussed in session.  Patient to utilize coping as discussed in session, setting boundaries and  relationship as needed, "saying no" as needed. Patient to take medications as prescribed and report any concerns to her prescribing provider   Assessment of progress:  progressing   Anson [place], [facility]

## 2022-11-17 DIAGNOSIS — M25511 Pain in right shoulder: Secondary | ICD-10-CM | POA: Diagnosis not present

## 2022-11-25 ENCOUNTER — Ambulatory Visit: Payer: BC Managed Care – PPO | Admitting: Mental Health

## 2022-11-25 DIAGNOSIS — F411 Generalized anxiety disorder: Secondary | ICD-10-CM | POA: Diagnosis not present

## 2022-11-26 NOTE — Progress Notes (Signed)
[facility] Counselor/Therapist Progress Note   Patient ID: Paula Elliott,  MRN: [id number] Date: 11/25/22  Timespent: 55 minutes    Treatment Type: Individual therapy    Mental Status Exam:      Appearance:    Casual, appropriate  Behavior:   WNL  Motor:   WNL  Speech/Language:    Clear and Coherent  Affect:   Full range  Mood:   Euthymic, pleasant  Thought process:   normal  Thought content:     WNL, denies SI/HI  Sensory/Perceptual disturbances:     none  Orientation:   oriented to person, place and time/date  Attention:   Good  Concentration:   Good  Memory:   WNL  Fund of knowledge:    Good  Insight:     developing  Judgment:    good  Impulse Control:   good    Reported Symptoms: Depressed mood most days, anxiety, rumination, hx of mood swings, crying spells    Risk Assessment: Danger to Self:  No Self-injurious Behavior:  Positive history, none in the past year  Danger to Others: No Duty to Warn:no Physical Aggression / Violence:No  Access to Firearms a concern: No  Gang Involvement:No  Patient / guardian was educated about steps to take if suicide or homicide risk level increases between visits. While future psychiatric events cannot be accurately predicted, the patient does not currently require acute inpatient psychiatric care and does not currently meet [facility] involuntary commitment criteria.   Subjective:   Patient presents on time for today's session.  Assess progress where patient stated that recently, she enjoyed the Christmas holiday spending it with her mother.  She shared experiences they shared with 1 another.  She focused on the stressor of work recently going on to share many details related.  She stated that she is somewhat satisfied with her job in some aspects but not in others.  Facilitated her sharing recent experiences that have become upsetting, her getting low reviews on her work productivity which she stated are  inaccurate, going on to give several validating details.  Explored collaboratively ways to communicate, advocate for herself which can be challenging to do for patient.  She associated some of her recent anxiety to the worries about her job security where we worked with her to reframe throughout the session.   Interventions:  supportive therapy, strength based approaches, motivational interviewing, CBT  Diagnosis:   ICD-10-CM   1. Generalized anxiety disorder  F41.1           Plan:   Patient to follow through with exercise regimen.  Patient to continue to be mindful of her dietary intake, ensuring that she is getting adequate nutrition.  Patient continued to journal as needed related to life stressors and the continual outlet needed related to the loss of her father.  Patient to continue to work on setting boundaries in relationships.  Long-term goal:   Reduce overall level, frequency, and intensity of the feelings of depression and anxiety in  severity for at least 3 consecutive months.   Short-term goal:  Decrease "deflating, self-doubting" and catastrophizing thinking style Decrease anxiety producing self talk such as thinking of the worse possible life outcomes Decrease panic episodes by utilizing coping skills discussed in session.  Patient to utilize coping as discussed in session, setting boundaries and relationship as needed, "saying no" as needed. Patient to take medications as prescribed and report any concerns to her prescribing provider  Assessment of progress:  progressing   Anson Oregon, [facility]

## 2022-12-09 ENCOUNTER — Ambulatory Visit: Payer: BC Managed Care – PPO | Admitting: Mental Health

## 2022-12-22 ENCOUNTER — Ambulatory Visit: Payer: BC Managed Care – PPO | Admitting: Mental Health

## 2022-12-30 DIAGNOSIS — F418 Other specified anxiety disorders: Secondary | ICD-10-CM | POA: Diagnosis not present

## 2022-12-30 DIAGNOSIS — F900 Attention-deficit hyperactivity disorder, predominantly inattentive type: Secondary | ICD-10-CM | POA: Diagnosis not present

## 2022-12-30 DIAGNOSIS — F3181 Bipolar II disorder: Secondary | ICD-10-CM | POA: Diagnosis not present

## 2023-01-05 ENCOUNTER — Ambulatory Visit (INDEPENDENT_AMBULATORY_CARE_PROVIDER_SITE_OTHER): Payer: BC Managed Care – PPO | Admitting: Mental Health

## 2023-01-05 DIAGNOSIS — F411 Generalized anxiety disorder: Secondary | ICD-10-CM | POA: Diagnosis not present

## 2023-01-06 NOTE — Progress Notes (Signed)
[facility] Counselor/Therapist Progress Note   Patient ID: Paula Elliott,  MRN: [id number] Date: 01/05/23  Timespent: 59 minutes    Treatment Type: Individual therapy    Mental Status Exam:      Appearance:    Casual, appropriate  Behavior:   WNL  Motor:   WNL  Speech/Language:    Clear and Coherent  Affect:   Full range  Mood:   Euthymic, anxious  Thought process:   normal  Thought content:     WNL, denies SI/HI  Sensory/Perceptual disturbances:     none  Orientation:   oriented to person, place and time/date  Attention:   Good  Concentration:   Good  Memory:   WNL  Fund of knowledge:    Good  Insight:     developing  Judgment:    good  Impulse Control:   good    Reported Symptoms: Depressed mood most days, anxiety, rumination, hx of mood swings, crying spells    Risk Assessment: Danger to Self:  No Self-injurious Behavior:  Positive history, none in the past year  Danger to Others: No Duty to Warn:no Physical Aggression / Violence:No  Access to Firearms a concern: No  Gang Involvement:No  Patient / guardian was educated about steps to take if suicide or homicide risk level increases between visits. While future psychiatric events cannot be accurately predicted, the patient does not currently require acute inpatient psychiatric care and does not currently meet [facility] involuntary commitment criteria.   Subjective:   Patient presents on time for today's session.  Assessed recent events and progress.  She reports challenges with her mood, going on to share how she has been coping with feeling depressed and describes manic states.  She stated that she has had some excessive spending recently, difficulty sitting still feeling "on edge".  She plans to follow-up with her psychiatry appointment next week to further discuss as she also recently had a med change about 2 weeks ago.  She has been stressed at work due to job dissatisfaction, not feeling  validated which she states is also evident by discussions with other coworkers.  She wants to find another job is more suited to complimenting her creativity, she stated she recently started the process of looking more diligently and plans to continue.  Reports daily anxiety, she works to reframe and this was also worked on in session.  Family relationships are going well, continues to live with her mother, stated that she and her boyfriend are doing very well, she values their relationship.  She continues to identify struggles with self-confidence patient was encouraged to recognize how she has changed over the past few years.  Interventions:  supportive therapy, strength based approaches, motivational interviewing, CBT  Diagnosis:   ICD-10-CM   1. Generalized anxiety disorder  F41.1            Plan:   Patient to follow through with exercise regimen.  Patient to continue to be mindful of her dietary intake, ensuring that she is getting adequate nutrition.  Patient continued to journal as needed related to life stressors and the continual outlet needed related to the loss of her father.  Patient to continue to work on setting boundaries in relationships.  Long-term goal:   Reduce overall level, frequency, and intensity of the feelings of depression and anxiety in  severity for at least 3 consecutive months.   Short-term goal:  Decrease "deflating, self-doubting" and catastrophizing thinking style Decrease  anxiety producing self talk such as thinking of the worse possible life outcomes Decrease panic episodes by utilizing coping skills discussed in session.  Patient to utilize coping as discussed in session, setting boundaries and relationship as needed, "saying no" as needed. Patient to take medications as prescribed and report any concerns to her prescribing provider   Assessment of progress:  progressing   Anson [place], [facility]

## 2023-01-13 DIAGNOSIS — F900 Attention-deficit hyperactivity disorder, predominantly inattentive type: Secondary | ICD-10-CM | POA: Diagnosis not present

## 2023-01-13 DIAGNOSIS — F3181 Bipolar II disorder: Secondary | ICD-10-CM | POA: Diagnosis not present

## 2023-01-13 DIAGNOSIS — F418 Other specified anxiety disorders: Secondary | ICD-10-CM | POA: Diagnosis not present

## 2023-01-19 ENCOUNTER — Ambulatory Visit (INDEPENDENT_AMBULATORY_CARE_PROVIDER_SITE_OTHER): Payer: BC Managed Care – PPO | Admitting: Mental Health

## 2023-01-19 DIAGNOSIS — F411 Generalized anxiety disorder: Secondary | ICD-10-CM

## 2023-01-19 NOTE — Progress Notes (Signed)
[facility] Counselor/Therapist Progress Note   Patient ID: Paula Elliott,  MRN: [id number] Date: 01/19/23  Timespent: 59 minutes    Treatment Type: Individual therapy    Mental Status Exam:      Appearance:    Casual, appropriate  Behavior:   WNL  Motor:   WNL  Speech/Language:    Clear and Coherent  Affect:   Full range  Mood:   Euthymic, anxious  Thought process:   normal  Thought content:     WNL, denies SI/HI  Sensory/Perceptual disturbances:     none  Orientation:   oriented to person, place and time/date  Attention:   Good  Concentration:   Good  Memory:   WNL  Fund of knowledge:    Good  Insight:     developing  Judgment:    good  Impulse Control:   good    Reported Symptoms: Depressed mood most days, anxiety, rumination, hx of mood swings, crying spells    Risk Assessment: Danger to Self:  No Self-injurious Behavior:  Positive history, none in the past year  Danger to Others: No Duty to Warn:no Physical Aggression / Violence:No  Access to Firearms a concern: No  Gang Involvement:No  Patient / guardian was educated about steps to take if suicide or homicide risk level increases between visits. While future psychiatric events cannot be accurately predicted, the patient does not currently require acute inpatient psychiatric care and does not currently meet [facility] involuntary commitment criteria.   Subjective:   Patient presents on time for today's session.  Assessed recent events and progress.  She reports challenges with her mood, going on to share how she has been coping with feeling depressed and describes manic states.  She stated that she has had some excessive spending recently, difficulty sitting still feeling "on edge".  She plans to follow-up with her psychiatry appointment next week to further discuss as she also recently had a med change about 2 weeks ago.  She has been stressed at work due to job dissatisfaction, not feeling  validated which she states is also evident by discussions with other coworkers.  She wants to find another job is more suited to complimenting her creativity, she stated she recently started the process of looking more diligently and plans to continue.  Reports daily anxiety, she works to reframe and this was also worked on in session.  Family relationships are going well, continues to live with her mother, stated that she and her boyfriend are doing very well, she values their relationship.  She continues to identify struggles with self-confidence patient was encouraged to recognize how she has changed over the past few years.  Interventions:  supportive therapy, strength based approaches, motivational interviewing, CBT  Diagnosis:   ICD-10-CM   1. Generalized anxiety disorder  F41.1        Plan:   Patient to follow through with exercise regimen.  Patient to continue to be mindful of her dietary intake, ensuring that she is getting adequate nutrition.  Patient continued to journal as needed related to life stressors and the continual outlet needed related to the loss of her father.  Patient to continue to work on setting boundaries in relationships.  Long-term goal:   Reduce overall level, frequency, and intensity of the feelings of depression and anxiety in  severity for at least 3 consecutive months.   Short-term goal:  Decrease "deflating, self-doubting" and catastrophizing thinking style Decrease anxiety producing self talk  such as thinking of the worse possible life outcomes Decrease panic episodes by utilizing coping skills discussed in session.  Patient to utilize coping as discussed in session, setting boundaries and relationship as needed, "saying no" as needed. Patient to take medications as prescribed and report any concerns to her prescribing provider   Assessment of progress:  progressing   Anson [place], [facility]

## 2023-01-29 DIAGNOSIS — H04123 Dry eye syndrome of bilateral lacrimal glands: Secondary | ICD-10-CM | POA: Diagnosis not present

## 2023-01-29 DIAGNOSIS — H40033 Anatomical narrow angle, bilateral: Secondary | ICD-10-CM | POA: Diagnosis not present

## 2023-02-02 ENCOUNTER — Ambulatory Visit: Payer: BC Managed Care – PPO | Admitting: Mental Health

## 2023-02-02 DIAGNOSIS — F411 Generalized anxiety disorder: Secondary | ICD-10-CM

## 2023-02-02 NOTE — Progress Notes (Signed)
[facility] Counselor/Therapist Progress Note   Patient ID: Paula Elliott,  MRN: [id number] Date: 02/02/23  Timespent: 55 minutes    Treatment Type: Individual therapy    Mental Status Exam:      Appearance:    Casual, appropriate  Behavior:   WNL  Motor:   WNL  Speech/Language:    Clear and Coherent  Affect:   Full range  Mood:   Euthymic, anxious  Thought process:   normal  Thought content:     WNL, denies SI/HI  Sensory/Perceptual disturbances:     none  Orientation:   oriented to person, place and time/date  Attention:   Good  Concentration:   Good  Memory:   WNL  Fund of knowledge:    Good  Insight:     developing  Judgment:    good  Impulse Control:   good    Reported Symptoms: Depressed mood most days, anxiety, rumination, hx of mood swings, crying spells    Risk Assessment: Danger to Self:  No Self-injurious Behavior:  Positive history, none in the past year  Danger to Others: No Duty to Warn:no Physical Aggression / Violence:No  Access to Firearms a concern: No  Gang Involvement:No  Patient / guardian was educated about steps to take if suicide or homicide risk level increases between visits. While future psychiatric events cannot be accurately predicted, the patient does not currently require acute inpatient psychiatric care and does not currently meet [facility] involuntary commitment criteria.   Subjective:   Patient presents on time for today's session.  Assessed progress.  She reports ongoing work-related stress, detailing dissatisfaction at work due to various factors.  Ultimately she is not being fulfilled at work, challenged this primarily being centered around it not being something with which she is truly passionate.  She continues to identify the significance of her finding a career that meets this need, centered around her creativity and specifically her writing abilities.  Through further guided discovery via motivational  interviewing, she identified the need to continue reading books with more consistency as this is one of her outlets self-development but also stress relief.  She plans to also engage soon in some writing experiences to develop her skills.  Explored with patient needs, facilitating her identifying self supportive thoughts, hopes and plans.    Interventions:  supportive therapy, strength based approaches, motivational interviewing, CBT  Diagnosis:   ICD-10-CM   1. Generalized anxiety disorder  F41.1         Plan:   Patient to follow through with exercise regimen.  Patient to continue to be mindful of her dietary intake, ensuring that she is getting adequate nutrition.  Patient continued to journal as needed related to life stressors and the continual outlet needed related to the loss of her father.  Patient to continue to work on setting boundaries in relationships.  Long-term goal:   Reduce overall level, frequency, and intensity of the feelings of depression and anxiety in  severity for at least 3 consecutive months.   Short-term goal:  Decrease "deflating, self-doubting" and catastrophizing thinking style Decrease anxiety producing self talk such as thinking of the worse possible life outcomes Decrease panic episodes by utilizing coping skills discussed in session.  Patient to utilize coping as discussed in session, setting boundaries and relationship as needed, "saying no" as needed. Patient to take medications as prescribed and report any concerns to her prescribing provider   Assessment of progress:  progressing  Anson [place], [facility]

## 2023-02-11 DIAGNOSIS — F418 Other specified anxiety disorders: Secondary | ICD-10-CM | POA: Diagnosis not present

## 2023-02-11 DIAGNOSIS — F900 Attention-deficit hyperactivity disorder, predominantly inattentive type: Secondary | ICD-10-CM | POA: Diagnosis not present

## 2023-02-11 DIAGNOSIS — F3181 Bipolar II disorder: Secondary | ICD-10-CM | POA: Diagnosis not present

## 2023-02-24 ENCOUNTER — Ambulatory Visit: Payer: BC Managed Care – PPO | Admitting: Mental Health

## 2023-03-09 ENCOUNTER — Ambulatory Visit: Payer: BC Managed Care – PPO | Admitting: Mental Health

## 2023-03-09 DIAGNOSIS — F411 Generalized anxiety disorder: Secondary | ICD-10-CM

## 2023-03-09 NOTE — Progress Notes (Signed)
[facility] Counselor/Therapist Progress Note   Patient ID: Paula Elliott,  MRN: [id number] Date: 03/09/23  Timespent: 57 minutes    Treatment Type: Individual therapy    Mental Status Exam:      Appearance:    Casual, appropriate  Behavior:   WNL  Motor:   WNL  Speech/Language:    Clear and Coherent  Affect:   Full range  Mood:   Euthymic, anxious  Thought process:   normal  Thought content:     WNL, denies SI/HI  Sensory/Perceptual disturbances:     none  Orientation:   oriented to person, place and time/date  Attention:   Good  Concentration:   Good  Memory:   WNL  Fund of knowledge:    Good  Insight:     developing  Judgment:    good  Impulse Control:   good    Reported Symptoms: Depressed mood most days, anxiety, rumination, hx of mood swings, crying spells    Risk Assessment: Danger to Self:  No Self-injurious Behavior:  Positive history, none in the past year  Danger to Others: No Duty to Warn:no Physical Aggression / Violence:No  Access to Firearms a concern: No  Gang Involvement:No  Patient / guardian was educated about steps to take if suicide or homicide risk level increases between visits. While future psychiatric events cannot be accurately predicted, the patient does not currently require acute inpatient psychiatric care and does not currently meet [facility] involuntary commitment criteria.   Subjective:   Patient presents on time for today's session.  Assessed progress since last visit.  She continues within the family on her work related stress.  She provided many experiences, details related to feeling invalidated at work, this also being evident throughout other coworkers feel as she had a Merchandiser, retail recently quit abruptly.  Ways to continue to maintain at her job and keep stress manageable was explored collaboratively.  She plans to continue to follow through consistently on her work and, although she would like to quit her job  plans to put a notice when that time comes.  She continues to express motivation and has followed through with looking at other job possibilities, applied to continue specifically which will afford her essentially a greater income as well as the option to work from home which she finds enticing.  She plans also follow through with continuing her for reading and writing and further plans to work and develop these skills for eventual employment options.    Interventions:  supportive therapy, strength based approaches, motivational interviewing, CBT  Diagnosis: No diagnosis found.     Plan:   Patient to follow through with exercise regimen.  Patient to continue to be mindful of her dietary intake, ensuring that she is getting adequate nutrition.  Patient continued to journal as needed related to life stressors and the continual outlet needed related to the loss of her father.  Patient to continue to work on setting boundaries in relationships.  Long-term goal:   Reduce overall level, frequency, and intensity of the feelings of depression and anxiety in  severity for at least 3 consecutive months.   Short-term goal:  Decrease "deflating, self-doubting" and catastrophizing thinking style Decrease anxiety producing self talk such as thinking of the worse possible life outcomes Decrease panic episodes by utilizing coping skills discussed in session.  Patient to utilize coping as discussed in session, setting boundaries and relationship as needed, "saying no" as needed. Patient to take  medications as prescribed and report any concerns to her prescribing provider   Assessment of progress:  progressing   Waldron Session, [facility]

## 2023-03-11 DIAGNOSIS — F3181 Bipolar II disorder: Secondary | ICD-10-CM | POA: Diagnosis not present

## 2023-03-11 DIAGNOSIS — F418 Other specified anxiety disorders: Secondary | ICD-10-CM | POA: Diagnosis not present

## 2023-03-11 DIAGNOSIS — F900 Attention-deficit hyperactivity disorder, predominantly inattentive type: Secondary | ICD-10-CM | POA: Diagnosis not present

## 2023-03-14 ENCOUNTER — Ambulatory Visit: Payer: BC Managed Care – PPO | Admitting: Adult Health

## 2023-03-14 ENCOUNTER — Encounter: Payer: Self-pay | Admitting: Adult Health

## 2023-03-14 VITALS — BP 106/72 | HR 56 | Ht 64.0 in | Wt 122.5 lb

## 2023-03-14 DIAGNOSIS — Z3202 Encounter for pregnancy test, result negative: Secondary | ICD-10-CM | POA: Insufficient documentation

## 2023-03-14 DIAGNOSIS — Z3041 Encounter for surveillance of contraceptive pills: Secondary | ICD-10-CM | POA: Diagnosis not present

## 2023-03-14 DIAGNOSIS — R6882 Decreased libido: Secondary | ICD-10-CM | POA: Diagnosis not present

## 2023-03-14 LAB — POCT URINE PREGNANCY: Preg Test, Ur: NEGATIVE

## 2023-03-14 MED ORDER — NORETHINDRONE 0.35 MG PO TABS: 1.0000 | ORAL_TABLET | Freq: Every day | ORAL | 6 refills | Status: DC

## 2023-03-14 NOTE — Progress Notes (Signed)
  Subjective:     Patient ID: Paula Elliott, female   DOB: October 23, 1991, 32 y.o.   MRN: [id number]  HPI Paula Elliott is a 32 year old white female,single, G0P0, in complaining of decreased libido with lo Loestrin.  Last pap was negative HPV,ASCUS 07/03/20.    Review of Systems Decreased libido Reviewed past medical,surgical, social and family history. Reviewed medications and allergies.     Objective:   Physical Exam BP 106/72 (BP Location: Left Arm, Patient Position: Sitting, Cuff Size: Normal)   Pulse (!) 56   Ht 5\' 4"  (1.626 m)   Wt 122 lb 8 oz (55.6 kg)   BMI 21.03 kg/m  UPT is negative Skin warm and dry. Lungs: clear to ausculation bilaterally. Cardiovascular: regular rate and rhythm, +murmur Fall risk is low  Upstream - 03/14/23 1634       Pregnancy Intention Screening   Does the patient want to become pregnant in the next year? No    Does the patient's partner want to become pregnant in the next year? No    Would the patient like to discuss contraceptive options today? Yes      Contraception Wrap Up   Current Method Oral Contraceptive    End Method Oral Contraceptive    Contraception Counseling Provided Yes    How was the end contraceptive method provided? Prescription                Assessment:     1. Pregnancy examination or test, negative result  2. Decreased libido Will try a change in birth control pills Will rx Micronor, use condoms for 1 pack Did discuss could be related to other meds too.   3. Encounter for surveillance of contraceptive pills Finish lo Loestrin and start Micronor,take same time every day and use condoms for 1 pack  Meds ordered this encounter  Medications   norethindrone (MICRONOR) 0.35 MG tablet    Sig: Take 1 tablet (0.35 mg total) by mouth daily.    Dispense:  28 tablet    Refill:  6    Order Specific Question:   Supervising Provider    Answer:   Lazaro Arms [[id number]]        Plan:     Return in 3 months for pap and  physical with me and ROS

## 2023-03-24 ENCOUNTER — Ambulatory Visit: Payer: BC Managed Care – PPO | Admitting: Mental Health

## 2023-03-24 DIAGNOSIS — F411 Generalized anxiety disorder: Secondary | ICD-10-CM

## 2023-03-25 NOTE — Progress Notes (Signed)
      [facility] Counselor/Therapist Progress Note   Patient ID: Paula Elliott,  MRN: [id number] Date: 03/24/23  Timespent: 57 minutes    Treatment Type: Individual therapy    Mental Status Exam:      Appearance:    Casual, appropriate  Behavior:   WNL  Motor:   WNL  Speech/Language:    Clear and Coherent  Affect:   Full range  Mood:   Euthymic, anxious  Thought process:   normal  Thought content:     WNL, denies SI/HI  Sensory/Perceptual disturbances:     none  Orientation:   oriented to person, place and time/date  Attention:   Good  Concentration:   Good  Memory:   WNL  Fund of knowledge:    Good  Insight:     developing  Judgment:    good  Impulse Control:   good    Reported Symptoms: Depressed mood most days, anxiety, rumination, hx of mood swings, crying spells    Risk Assessment: Danger to Self:  No Self-injurious Behavior:  Positive history, none in the past year  Danger to Others: No Duty to Warn:no Physical Aggression / Violence:No  Access to Firearms a concern: No  Gang Involvement:No  Patient / guardian was educated about steps to take if suicide or homicide risk level increases between visits. While future psychiatric events cannot be accurately predicted, the patient does not currently require acute inpatient psychiatric care and does not currently meet [facility] involuntary commitment criteria.   Subjective:   Patient presents on time for today's session. She continues to work her job while also looking for other employment options. She ultimately wants to work from home and is remaining hopeful and optimistic about potential options; she feels she'll eventually find the right job and plans to be patient. She just got some recent panic attacks, more specifically ruminations that she had difficulty disconnecting from cognitively. We reviewed coping skills related to mindfulness, diaphragmatic breathing, grounding in five.  Provided some  psycho education related to more specific ways to get grounded particularly using her senses and being mindful of her cognitions.  She plans to also talk to her prescribing doctor as she has some questions about the effectiveness for current medication.    Interventions:  supportive therapy, strength based approaches, motivational interviewing, CBT  Diagnosis:   ICD-10-CM   1. Generalized anxiety disorder  F41.1          Plan:   Patient to follow through with exercise regimen.  Patient to continue to be mindful of her dietary intake, ensuring that she is getting adequate nutrition.  Patient continued to journal as needed related to life stressors and the continual outlet needed related to the loss of her father.  Patient to continue to work on setting boundaries in relationships.  Long-term goal:   Reduce overall level, frequency, and intensity of the feelings of depression and anxiety in  severity for at least 3 consecutive months.   Short-term goal:  Decrease "deflating, self-doubting" and catastrophizing thinking style Decrease anxiety producing self talk such as thinking of the worse possible life outcomes Decrease panic episodes by utilizing coping skills discussed in session.  Patient to utilize coping as discussed in session, setting boundaries and relationship as needed, "saying no" as needed. Patient to take medications as prescribed and report any concerns to her prescribing provider   Assessment of progress:  progressing   Waldron Session, [facility]

## 2023-03-30 HISTORY — PX: OTHER SURGICAL HISTORY: SHX169

## 2023-04-07 ENCOUNTER — Ambulatory Visit: Payer: BC Managed Care – PPO | Admitting: Mental Health

## 2023-04-20 ENCOUNTER — Ambulatory Visit: Payer: BC Managed Care – PPO | Admitting: Mental Health

## 2023-04-20 DIAGNOSIS — F411 Generalized anxiety disorder: Secondary | ICD-10-CM

## 2023-04-20 NOTE — Progress Notes (Signed)
[facility] Counselor/Therapist Progress Note   Patient ID: Paula Elliott,  MRN: [id number] Date: 04/19/23  Timespent: 54 minutes    Treatment Type: Individual therapy    Mental Status Exam:      Appearance:    Casual, appropriate  Behavior:   WNL  Motor:   WNL  Speech/Language:    Clear and Coherent  Affect:   Full range  Mood:   Euthymic, anxious  Thought process:   normal  Thought content:     WNL, denies SI/HI  Sensory/Perceptual disturbances:     none  Orientation:   oriented to person, place and time/date  Attention:   Good  Concentration:   Good  Memory:   WNL  Fund of knowledge:    Good  Insight:     developing  Judgment:    good  Impulse Control:   good    Reported Symptoms: Depressed mood most days, anxiety, rumination, hx of mood swings, crying spells    Risk Assessment: Danger to Self:  No Self-injurious Behavior:  Positive history, none in the past year  Danger to Others: No Duty to Warn:no Physical Aggression / Violence:No  Access to Firearms a concern: No  Gang Involvement:No  Patient / guardian was educated about steps to take if suicide or homicide risk level increases between visits. While future psychiatric events cannot be accurately predicted, the patient does not currently require acute inpatient psychiatric care and does not currently meet [facility] involuntary commitment criteria.   Subjective:   Patient presents on time for today's session.  Assessed progress with patient shared how she continues to work her current job and is also planning applications to other employment options, specifically to allow her job where she could work.  She stated that she recently had cosmetic surgery and is in the process.  She went on to share complications in the relationship with her boyfriend.  She stated that following the surgery they communicated and she was hopeful that he would come visit her however he did not.  She stated that he  copes with depression which she understands is significant, however, she stated that he had indicated he would visit her.  Patient stated that this is notable as he has never driven to her home to visit which is about 40 minutes away. Facilitated patient identifying needs going forward where she plans to continue to work on discussing issues with her boyfriend, being assertive and identifying and sharing her needs which she stated is difficult and typically something she finds herself avoiding.  We explored associated feelings for this avoidance and benefits potentially of her being more assertive.  nterventions:  supportive therapy, strength based approaches, motivational interviewing, CBT  Diagnosis:   ICD-10-CM   1. Generalized anxiety disorder  F41.1           Plan:   Patient to follow through with exercise regimen.  Patient to continue to be mindful of her dietary intake, ensuring that she is getting adequate nutrition.  Patient continued to journal as needed related to life stressors and the continual outlet needed related to the loss of her father.  Patient to continue to work on setting boundaries in relationships.  Long-term goal:   Reduce overall level, frequency, and intensity of the feelings of depression and anxiety in  severity for at least 3 consecutive months.   Short-term goal:  Decrease "deflating, self-doubting" and catastrophizing thinking style Decrease anxiety producing self talk such as thinking  of the worse possible life outcomes Decrease panic episodes by utilizing coping skills discussed in session.  Patient to utilize coping as discussed in session, setting boundaries and relationship as needed, "saying no" as needed. Patient to take medications as prescribed and report any concerns to her prescribing provider   Assessment of progress:  progressing   Waldron Session, [facility]

## 2023-05-05 ENCOUNTER — Ambulatory Visit: Payer: BC Managed Care – PPO | Admitting: Mental Health

## 2023-05-05 DIAGNOSIS — F411 Generalized anxiety disorder: Secondary | ICD-10-CM | POA: Diagnosis not present

## 2023-05-18 ENCOUNTER — Ambulatory Visit: Payer: BC Managed Care – PPO | Admitting: Mental Health

## 2023-05-20 DIAGNOSIS — F418 Other specified anxiety disorders: Secondary | ICD-10-CM | POA: Diagnosis not present

## 2023-05-20 DIAGNOSIS — F3181 Bipolar II disorder: Secondary | ICD-10-CM | POA: Diagnosis not present

## 2023-05-20 DIAGNOSIS — F900 Attention-deficit hyperactivity disorder, predominantly inattentive type: Secondary | ICD-10-CM | POA: Diagnosis not present

## 2023-06-01 ENCOUNTER — Ambulatory Visit: Payer: BC Managed Care – PPO | Admitting: Mental Health

## 2023-06-01 DIAGNOSIS — F411 Generalized anxiety disorder: Secondary | ICD-10-CM | POA: Diagnosis not present

## 2023-06-03 NOTE — Progress Notes (Signed)
[facility] Counselor/Therapist Progress Note   Patient ID: Paula Elliott,  MRN: [id number] Date: 05/05/23  Timespent: 53 minutes    Treatment Type: Individual therapy    Mental Status Exam:      Appearance:    Casual, appropriate  Behavior:   WNL  Motor:   WNL  Speech/Language:    Clear and Coherent  Affect:   Full range  Mood:   Euthymic, anxious  Thought process:   normal  Thought content:     WNL, denies SI/HI  Sensory/Perceptual disturbances:     none  Orientation:   oriented to person, place and time/date  Attention:   Good  Concentration:   Good  Memory:   WNL  Fund of knowledge:    Good  Insight:     developing  Judgment:    good  Impulse Control:   good    Reported Symptoms: Depressed mood most days, anxiety, rumination, hx of mood swings, crying spells    Risk Assessment: Danger to Self:  No Self-injurious Behavior:  Positive history, none in the past year  Danger to Others: No Duty to Warn:no Physical Aggression / Violence:No  Access to Firearms a concern: No  Gang Involvement:No  Patient / guardian was educated about steps to take if suicide or homicide risk level increases between visits. While future psychiatric events cannot be accurately predicted, the patient does not currently require acute inpatient psychiatric care and does not currently meet [facility] involuntary commitment criteria.   Subjective:   Patient presents on time for today's session.  Patient shared some ongoing work-related stress, her continued job search which she has struggled to obtain interviews after applying for various jobs.  She primarily wants to work from home and does not want to make hasty decision about taking any job although she stated she is burned out at her current company.  Provide support and understanding if she detailed several contributing factors.  She went on to share family and other relationships.  She stated that her boyfriend did visit her  recently, how this was very meaningful is making this effort.  She stated that this has helped their communication and strengthen their relationship as she feels more hurt and validated.  She identified some stress in the relationship with her mother which has been somewhat ongoing, concerns about her mother's frequency of alcohol use in the evenings to a point where she is often intoxicated.  She identified challenges with her mother having a boyfriend, wanting him to come over to their house however patient stated she set a boundary where she told her mother that she would have to not be present if this were to occur due to her feeling uncomfortable.  Patient continues to adjust and cope with the loss of her father and how this impacted her life at home with her mother.   nterventions:  supportive therapy, strength based approaches, motivational interviewing, CBT  Diagnosis:   ICD-10-CM   1. Generalized anxiety disorder  F41.1            Plan:   Patient to follow through with exercise regimen.  Patient to continue to be mindful of her dietary intake, ensuring that she is getting adequate nutrition.  Patient continued to journal as needed related to life stressors and the continual outlet needed related to the loss of her father.  Patient to continue to work on setting boundaries in relationships.  Long-term goal:   Reduce overall level,  frequency, and intensity of the feelings of depression and anxiety in  severity for at least 3 consecutive months.   Short-term goal:  Decrease "deflating, self-doubting" and catastrophizing thinking style Decrease anxiety producing self talk such as thinking of the worse possible life outcomes Decrease panic episodes by utilizing coping skills discussed in session.  Patient to utilize coping as discussed in session, setting boundaries and relationship as needed, "saying no" as needed. Patient to take medications as prescribed and report any concerns to  her prescribing provider   Assessment of progress:  progressing   Waldron Session, [facility]

## 2023-06-03 NOTE — Progress Notes (Signed)
[facility] Counselor/Therapist Progress Note   Patient ID: Latonya Tauer,  MRN: [id number] Date: 06/01/23  Timespent: 55 minutes    Treatment Type: Individual therapy    Mental Status Exam:      Appearance:    Casual, appropriate  Behavior:   WNL  Motor:   WNL  Speech/Language:    Clear and Coherent  Affect:   Full range  Mood:   Euthymic, anxious  Thought process:   normal  Thought content:     WNL, denies SI/HI  Sensory/Perceptual disturbances:     none  Orientation:   oriented to person, place and time/date  Attention:   Good  Concentration:   Good  Memory:   WNL  Fund of knowledge:    Good  Insight:     developing  Judgment:    good  Impulse Control:   good    Reported Symptoms: Depressed mood most days, anxiety, rumination, hx of mood swings, crying spells    Risk Assessment: Danger to Self:  No Self-injurious Behavior:  Positive history, none in the past year  Danger to Others: No Duty to Warn:no Physical Aggression / Violence:No  Access to Firearms a concern: No  Gang Involvement:No  Patient / guardian was educated about steps to take if suicide or homicide risk level increases between visits. While future psychiatric events cannot be accurately predicted, the patient does not currently require acute inpatient psychiatric care and does not currently meet [facility] involuntary commitment criteria.   Subjective:   Patient presents on time for today's session.  Assessed progress where patient stated that she had some positive news regarding work.  She stated that she now will be able to work 2 days at home during the work week.  She stated this will not only save her some money but primarily help her feel less stressed.  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  She stated she plans to take further course work related to copywriting in the hopes that this will improve her  marketability.  Assess family relationships.  She continues to express concerns about her mother, changes in their relationship since her father's past.  She identified further the need to be less irritable and reactive to her mother.  We explored ways to communicate her feelings and concerns with her mother openly.  She plans to follow through.  Made the connection with her to further identify how this can assist her in reducing her anxiety and stress and be a component of self-care.  Interventions:  supportive therapy, strength based approaches, motivational interviewing, CBT  Diagnosis:   ICD-10-CM   1. Generalized anxiety disorder  F41.1            Plan:   Patient to follow through with exercise regimen.  Patient to continue to be mindful of her dietary intake, ensuring that she is getting adequate nutrition.  Patient continued to journal as needed related to life stressors and the continual outlet needed related to the loss of her father.  Patient to continue to work on setting boundaries in relationships.  Long-term goal:   Reduce overall level, frequency, and intensity of the feelings of depression and anxiety in  severity for at least 3 consecutive months.   Short-term goal:  Decrease "deflating, self-doubting" and catastrophizing thinking style Decrease anxiety producing self talk such as thinking of the worse possible life outcomes Decrease panic  episodes by utilizing coping skills discussed in session.  Patient to utilize coping as discussed in session, setting boundaries and relationship as needed, "saying no" as needed. Patient to take medications as prescribed and report any concerns to her prescribing provider   Assessment of progress:  progressing   Waldron Session, [facility]

## 2023-06-13 ENCOUNTER — Encounter: Payer: Self-pay | Admitting: Adult Health

## 2023-06-13 ENCOUNTER — Other Ambulatory Visit (HOSPITAL_COMMUNITY)
Admission: RE | Admit: 2023-06-13 | Discharge: 2023-06-13 | Disposition: A | Discharge status: Home / Self Care | Payer: BC Managed Care – PPO | Source: Ambulatory Visit | Attending: Adult Health | Admitting: Adult Health

## 2023-06-13 ENCOUNTER — Ambulatory Visit (INDEPENDENT_AMBULATORY_CARE_PROVIDER_SITE_OTHER): Payer: BC Managed Care – PPO | Admitting: Adult Health

## 2023-06-13 VITALS — BP 125/80 | HR 69 | Ht 64.0 in | Wt 124.0 lb

## 2023-06-13 DIAGNOSIS — N941 Unspecified dyspareunia: Secondary | ICD-10-CM | POA: Diagnosis not present

## 2023-06-13 DIAGNOSIS — Z01419 Encounter for gynecological examination (general) (routine) without abnormal findings: Secondary | ICD-10-CM | POA: Diagnosis not present

## 2023-06-13 DIAGNOSIS — Z3041 Encounter for surveillance of contraceptive pills: Secondary | ICD-10-CM

## 2023-06-13 DIAGNOSIS — R6882 Decreased libido: Secondary | ICD-10-CM

## 2023-06-13 DIAGNOSIS — Z13220 Encounter for screening for lipoid disorders: Secondary | ICD-10-CM

## 2023-06-13 MED ORDER — NORETHINDRONE 0.35 MG PO TABS
1.0000 | ORAL_TABLET | Freq: Every day | ORAL | 12 refills | Status: DC
Start: 2023-06-13 — End: 2024-08-01

## 2023-06-13 NOTE — Progress Notes (Signed)
Patient ID: Paula Elliott, female   DOB: 05-10-1991, 32 y.o.   MRN: [id number] History of Present Illness: Paula Elliott is a 32 year old white female,single, G0P0, in for a well woman gyn exam and pap. And needs BCP refilled. She had labia plasty in May.   Current Medications, Allergies, Past Medical History, Past Surgical History, Family History and Social History were reviewed in [name] record.     Review of Systems: Patient denies any headaches, hearing loss, fatigue, blurred vision, shortness of breath, chest pain, abdominal pain, problems with bowel movements, urination, or intercourse(sex hurts, had not had in 2 years). No joint pain or Elliott swings. Has decreased libido too She is happy with BCP, no period    Physical Exam:BP 125/80 (BP Location: Left Arm, Patient Position: Sitting, Cuff Size: Normal)   Pulse 69   Ht 5\' 4"  (1.626 m)   Wt 124 lb (56.2 kg)   BMI 21.28 kg/m   General:  Well developed, well nourished, no acute distress Skin:  Warm and dry Neck:  Midline trachea, normal thyroid, good ROM, no lymphadenopathy Lungs; Clear to auscultation bilaterally Breast:  No dominant palpable mass, retraction, or nipple discharge,has right nipple rod Cardiovascular: Regular rate and rhythm,+murmur Abdomen:  Soft, non tender, no hepatosplenomegaly Pelvic:  External genitalia is normal in appearance, no lesions.  The vagina is normal in appearance. Urethra has no lesions or masses. The cervix is nulliparous, pap with HR HPV genotyping performed.  Uterus is felt to be normal size, shape, and contour.  No adnexal masses or tenderness noted.Bladder is non tender, no masses felt. Extremities/musculoskeletal:  No swelling or varicosities noted, no clubbing or cyanosis Psych:  No Elliott changes, alert and cooperative,seems happy AA is 8 Fall risk is low    06/13/2023    1:32 PM 07/03/2020    3:21 PM 11/12/2019   11:25 AM  Depression screen PHQ 2/9  Decreased  Interest 1 0 0  Down, Depressed, Hopeless 1 0 1  PHQ - 2 Score 2 0 1  Altered sleeping 2 3   Tired, decreased energy 1 3   Change in appetite 2 0   Feeling bad or failure about yourself  3 0   Trouble concentrating 0 0   Moving slowly or fidgety/restless 0 0   Suicidal thoughts 0 0   PHQ-9 Score 10 6   Difficult doing work/chores  Somewhat difficult    Has counselor     06/13/2023    1:32 PM 07/03/2020    3:22 PM 09/11/2018    4:37 PM  GAD 7 : Generalized Anxiety Score  Nervous, Anxious, on Edge 3 3   Control/stop worrying 3 3   Worry too much - different things 3 3   Trouble relaxing 0 0   Restless 1 0   Easily annoyed or irritable 3 1   Afraid - awful might happen 1 0   Total GAD 7 Score 14 10   Anxiety Difficulty  Not difficult at all      Information is confidential and restricted. Go to Review Flowsheets to unlock data.      Upstream - 06/13/23 1336       Pregnancy Intention Screening   Does the patient want to become pregnant in the next year? No    Does the patient's partner want to become pregnant in the next year? No    Would the patient like to discuss contraceptive options today? No  Contraception Wrap Up   Current Method Oral Contraceptive    End Method Oral Contraceptive            Examination chaperoned by Paula Mood LPN   Impression and Plan: 1. Encounter for gynecological examination with Papanicolaou smear of cervix Pap sent Pap in 3 years if normal Physical in 1 year Will check labs  - Cytology - PAP( [place]) - CBC - Comprehensive metabolic panel - Lipid panel Decrease alcohol use, she says just on weekends   2. Encounter for surveillance of contraceptive pills Happy with BCP Meds ordered this encounter  Medications   norethindrone (MICRONOR) 0.35 MG tablet    Sig: Take 1 tablet (0.35 mg total) by mouth daily.    Dispense:  28 tablet    Refill:  12    Order Specific Question:   Supervising Provider    Answer:   Despina Hidden,  LUTHER H [[id number]]     3. Decreased libido  4. Dyspareunia, female Get vaginal dilator  Use good lubricator Talk with partner and increase foreplay  5. Screening cholesterol level - Lipid panel

## 2023-06-16 ENCOUNTER — Ambulatory Visit: Payer: BC Managed Care – PPO | Admitting: Mental Health

## 2023-06-16 LAB — CYTOLOGY - PAP
Comment: NEGATIVE
Diagnosis: NEGATIVE
High risk HPV: NEGATIVE

## 2023-06-21 DIAGNOSIS — F3181 Bipolar II disorder: Secondary | ICD-10-CM | POA: Diagnosis not present

## 2023-06-21 DIAGNOSIS — F418 Other specified anxiety disorders: Secondary | ICD-10-CM | POA: Diagnosis not present

## 2023-06-21 DIAGNOSIS — F900 Attention-deficit hyperactivity disorder, predominantly inattentive type: Secondary | ICD-10-CM | POA: Diagnosis not present

## 2023-06-29 ENCOUNTER — Ambulatory Visit: Payer: BC Managed Care – PPO | Admitting: Mental Health

## 2023-06-29 DIAGNOSIS — F411 Generalized anxiety disorder: Secondary | ICD-10-CM

## 2023-06-29 NOTE — Progress Notes (Signed)
[facility] Counselor/Therapist Progress Note   Patient ID: Paula Elliott,  MRN: [id number] Date: 06/29/23  Start time: 5: 00 p.m. stop time: 5: 53 PM  Treatment Type: Individual therapy    Mental Status Exam:      Appearance:    Casual, appropriate  Behavior:   WNL  Motor:   WNL  Speech/Language:    Clear and Coherent  Affect:   Full range  Mood:   Euthymic, anxious  Thought process:   normal  Thought content:     WNL, denies SI/HI  Sensory/Perceptual disturbances:     none  Orientation:   oriented to person, place and time/date  Attention:   Good  Concentration:   Good  Memory:   WNL  Fund of knowledge:    Good  Insight:     developing  Judgment:    good  Impulse Control:   good    Reported Symptoms: Depressed mood most days, anxiety, rumination, hx of mood swings, crying spells    Risk Assessment: Danger to Self:  No Self-injurious Behavior:  Positive history, none in the past year  Danger to Others: No Duty to Warn:no Physical Aggression / Violence:No  Access to Firearms a concern: No  Gang Involvement:No  Patient / guardian was educated about steps to take if suicide or homicide risk level increases between visits. While future psychiatric events cannot be accurately predicted, the patient does not currently require acute inpatient psychiatric care and does not currently meet [facility] involuntary commitment criteria.   Subjective:   Patient presents on time for today's session.  Patient reports some ongoing stress related to her job.  She expressed motivation and efforts to find other employment options however, at this point she is struggling to get interviews.  Provide support and encouragement for patient to continue while validating her efforts.  She expressed wanting to have a change in residence, continue to live with her mother and wanting this increased independence as one of her goals.  Assessed relationships, less strain in the  relationship with her mother.  Reports relationship with boyfriend going well although she continues to struggle with self image which has affected their relationship.  Ways to communicate in this relationship, expressing needs as well as seeking support was explored collaboratively.  Interventions:  supportive therapy, strength based approaches, motivational interviewing, CBT  Diagnosis:   ICD-10-CM   1. Generalized anxiety disorder  F41.1         Plan:   Patient to follow through with exercise regimen.  Patient to continue to be mindful of her dietary intake, ensuring that she is getting adequate nutrition.  Patient continued to journal as needed related to life stressors and the continual outlet needed related to the loss of her father.  Patient to continue to work on setting boundaries in relationships.  Long-term goal:   Reduce overall level, frequency, and intensity of the feelings of depression and anxiety in  severity for at least 3 consecutive months.   Short-term goal:  Decrease "deflating, self-doubting" and catastrophizing thinking style Decrease anxiety producing self talk such as thinking of the worse possible life outcomes Decrease panic episodes by utilizing coping skills discussed in session.  Patient to utilize coping as discussed in session, setting boundaries and relationship as needed, "saying no" as needed. Patient to take medications as prescribed and report any concerns to her prescribing provider   Assessment of progress:  progressing   Waldron Session, [facility]

## 2023-07-14 ENCOUNTER — Ambulatory Visit: Payer: BC Managed Care – PPO | Admitting: Mental Health

## 2023-07-19 DIAGNOSIS — F418 Other specified anxiety disorders: Secondary | ICD-10-CM | POA: Diagnosis not present

## 2023-07-19 DIAGNOSIS — F3181 Bipolar II disorder: Secondary | ICD-10-CM | POA: Diagnosis not present

## 2023-07-19 DIAGNOSIS — F900 Attention-deficit hyperactivity disorder, predominantly inattentive type: Secondary | ICD-10-CM | POA: Diagnosis not present

## 2023-07-27 ENCOUNTER — Ambulatory Visit (INDEPENDENT_AMBULATORY_CARE_PROVIDER_SITE_OTHER): Payer: BC Managed Care – PPO | Admitting: Mental Health

## 2023-07-27 DIAGNOSIS — F411 Generalized anxiety disorder: Secondary | ICD-10-CM

## 2023-07-27 NOTE — Progress Notes (Unsigned)
[facility] Counselor/Therapist Progress Note   Patient ID: Paula Elliott,  MRN: [id number] Date: 07/28/23  Start time: 5: 00 p.m. stop time: 5: 53 PM  Treatment Type: Individual therapy    Mental Status Exam:      Appearance:    Casual, appropriate  Behavior:   WNL  Motor:   WNL  Speech/Language:    Clear and Coherent  Affect:   Full range  Mood:   Euthymic, anxious  Thought process:   normal  Thought content:     WNL, denies SI/HI  Sensory/Perceptual disturbances:     none  Orientation:   oriented to person, place and time/date  Attention:   Good  Concentration:   Good  Memory:   WNL  Fund of knowledge:    Good  Insight:     developing  Judgment:    good  Impulse Control:   good    Reported Symptoms: Depressed mood most days, anxiety, rumination, hx of mood swings, crying spells    Risk Assessment: Danger to Self:  No Self-injurious Behavior:  Positive history, none in the past year  Danger to Others: No Duty to Warn:no Physical Aggression / Violence:No  Access to Firearms a concern: No  Gang Involvement:No  Patient / guardian was educated about steps to take if suicide or homicide risk level increases between visits. While future psychiatric events cannot be accurately predicted, the patient does not currently require acute inpatient psychiatric care and does not currently meet [facility] involuntary commitment criteria.   Subjective:   Patient presents on time for today's session.  Patient shared positive changes, how she put in her final notice of work today at her current full-time job.  She stated she secured another full-time position going on to share details.  She shared the significant stress that she already feels has been reduced by being able to leave her job going on to share further details of the work environment and with some fellow employees.  Facilitated her identifying hopes and expectations of her new job which will start in  approximately 2 weeks.  She stated will be a pay increase which is significant however, still not enough to where she feels she can live independently as she continues to live with her mother.  She stated she has another career opportunity that may take place in the next few weeks that could afford her the ability to work from home and have an additional pain increase.  Other relationships were explored, feeling better about her mother and how she has been coping with her own stress recently, continues to feel some responsibility and anxiety if she were to actually move out of her mother's home related to these concerns about her mother.  Interventions:  supportive therapy, strength based approaches, motivational interviewing, CBT  Diagnosis: No diagnosis found.     Plan:   Patient to follow through with exercise regimen.  Patient to continue to be mindful of her dietary intake, ensuring that she is getting adequate nutrition.  Patient continued to journal as needed related to life stressors and the continual outlet needed related to the loss of her father.  Patient to continue to work on setting boundaries in relationships.  Long-term goal:   Reduce overall level, frequency, and intensity of the feelings of depression and anxiety in  severity for at least 3 consecutive months.   Short-term goal:  Decrease "deflating, self-doubting" and catastrophizing thinking style Decrease anxiety producing self talk  such as thinking of the worse possible life outcomes Decrease panic episodes by utilizing coping skills discussed in session.  Patient to utilize coping as discussed in session, setting boundaries and relationship as needed, "saying no" as needed. Patient to take medications as prescribed and report any concerns to her prescribing provider   Assessment of progress:  progressing   Waldron Session, [facility]

## 2023-08-10 ENCOUNTER — Ambulatory Visit: Payer: BC Managed Care – PPO | Admitting: Mental Health

## 2023-08-22 ENCOUNTER — Ambulatory Visit: Payer: BC Managed Care – PPO | Admitting: Mental Health

## 2023-09-14 ENCOUNTER — Ambulatory Visit: Payer: BC Managed Care – PPO | Admitting: Mental Health

## 2023-10-03 ENCOUNTER — Ambulatory Visit: Payer: BC Managed Care – PPO | Admitting: Mental Health

## 2023-10-17 ENCOUNTER — Ambulatory Visit: Payer: BC Managed Care – PPO | Admitting: Mental Health

## 2023-11-08 DIAGNOSIS — F418 Other specified anxiety disorders: Secondary | ICD-10-CM | POA: Diagnosis not present

## 2023-11-08 DIAGNOSIS — F3181 Bipolar II disorder: Secondary | ICD-10-CM | POA: Diagnosis not present

## 2023-11-08 DIAGNOSIS — F900 Attention-deficit hyperactivity disorder, predominantly inattentive type: Secondary | ICD-10-CM | POA: Diagnosis not present

## 2023-12-06 DIAGNOSIS — F900 Attention-deficit hyperactivity disorder, predominantly inattentive type: Secondary | ICD-10-CM | POA: Diagnosis not present

## 2023-12-06 DIAGNOSIS — F418 Other specified anxiety disorders: Secondary | ICD-10-CM | POA: Diagnosis not present

## 2023-12-06 DIAGNOSIS — F3181 Bipolar II disorder: Secondary | ICD-10-CM | POA: Diagnosis not present

## 2023-12-06 DIAGNOSIS — F50811 Binge eating disorder, moderate: Secondary | ICD-10-CM | POA: Diagnosis not present

## 2024-02-22 DIAGNOSIS — F3181 Bipolar II disorder: Secondary | ICD-10-CM | POA: Diagnosis not present

## 2024-02-22 DIAGNOSIS — F900 Attention-deficit hyperactivity disorder, predominantly inattentive type: Secondary | ICD-10-CM | POA: Diagnosis not present

## 2024-02-22 DIAGNOSIS — F418 Other specified anxiety disorders: Secondary | ICD-10-CM | POA: Diagnosis not present

## 2024-02-22 DIAGNOSIS — F50811 Binge eating disorder, moderate: Secondary | ICD-10-CM | POA: Diagnosis not present

## 2024-03-06 ENCOUNTER — Ambulatory Visit: Payer: Self-pay | Admitting: Family Medicine

## 2024-03-06 ENCOUNTER — Encounter: Payer: Self-pay | Admitting: Family Medicine

## 2024-03-06 VITALS — BP 121/78 | HR 82 | Temp 97.5°F | Ht 64.0 in | Wt 125.4 lb

## 2024-03-06 DIAGNOSIS — Z0001 Encounter for general adult medical examination with abnormal findings: Secondary | ICD-10-CM | POA: Diagnosis not present

## 2024-03-06 DIAGNOSIS — Z1329 Encounter for screening for other suspected endocrine disorder: Secondary | ICD-10-CM | POA: Diagnosis not present

## 2024-03-06 DIAGNOSIS — Z1159 Encounter for screening for other viral diseases: Secondary | ICD-10-CM

## 2024-03-06 DIAGNOSIS — Z136 Encounter for screening for cardiovascular disorders: Secondary | ICD-10-CM | POA: Diagnosis not present

## 2024-03-06 DIAGNOSIS — M5432 Sciatica, left side: Secondary | ICD-10-CM

## 2024-03-06 DIAGNOSIS — Z79899 Other long term (current) drug therapy: Secondary | ICD-10-CM

## 2024-03-06 DIAGNOSIS — Z0000 Encounter for general adult medical examination without abnormal findings: Secondary | ICD-10-CM | POA: Diagnosis not present

## 2024-03-06 DIAGNOSIS — Z114 Encounter for screening for human immunodeficiency virus [HIV]: Secondary | ICD-10-CM

## 2024-03-06 MED ORDER — METHYLPREDNISOLONE ACETATE 40 MG/ML IJ SUSP
40.0000 mg | Freq: Once | INTRAMUSCULAR | Status: AC
  Administered 2024-03-06: 40 mg via INTRAMUSCULAR

## 2024-03-06 NOTE — Progress Notes (Signed)
 Complete physical exam  Patient: Paula Elliott   DOB: 08-16-1991   33 y.o. Female  MRN: [id number]  Subjective:    Chief Complaint  Patient presents with   New Patient (Initial Visit)   Establish Care   Pain    Patient states that she has been having a pain on her bottom for the last few months.   Annual Exam    Paula Elliott is a 33 y.o. female who presents today for a complete physical exam. She reports consuming a general diet.  She exercises and lifts weight on a regular basis.  She generally feels well. She reports sleeping well. She does have additional problems to discuss today.   Discussed the use of AI scribe software for clinical note transcription with the patient, who gave verbal consent to proceed.  History of Present Illness   Paula Elliott is a 33 year old female who presents for her annual physical exam and also complains of left buttock pain.  She experiences sharp, knife-like pain localized to the lower part of the left buttock, which began a couple of months ago after starting a weightlifting regimen. The pain is exacerbated by lifting weights and persists despite a week-long cessation of weightlifting. It slightly alleviates when standing but returns shortly after. She has tried ibuprofen, heating pads, ice, and stretching without relief.  She has a history of lower back problems and reports occasional tingling in the left leg associated with the buttock pain. No numbness, loss of bowel or bladder control, or significant relief from heat application. No recent changes in hearing or vision.  Her current medications include Lamictal, Adderall, Micronor, Latuda, and Restoril. She has not had her Lamictal levels checked since starting the medication approximately two years ago. Adderall and Micronor are effective for her ADHD and cycle regulation, respectively.  She works in Art gallery manager, went to school for [place], and is in a  relationship. She denies any risk for STDs. Her family history includes her father being diagnosed with leukemia at age 62.      Most recent fall risk assessment:    03/06/2024   10:31 AM  Fall Risk   Falls in the past year? 0  Injury with Fall? 0  Risk for fall due to : No Fall Risks  Follow up Falls evaluation completed     Most recent depression screenings:    03/06/2024   10:31 AM 06/13/2023    1:32 PM  PHQ 2/9 Scores  PHQ - 2 Score 0 2  PHQ- 9 Score 4 10    Vision:Within last year and Dental: No current dental problems and Receives regular dental care  Patient Active Problem List   Diagnosis Date Noted   Decreased libido 03/14/2023   Omphalomesenteric duct cyst    Ventral hernia without obstruction or gangrene    Periumbilical mass 02/04/2022   Attention deficit hyperactivity disorder (ADHD), predominantly inattentive type 10/26/2020   Atypical squamous cell changes of undetermined significance (ASCUS) on cervical cytology with negative high risk human papilloma virus (HPV) test result 07/09/2020   Menorrhagia with irregular cycle 07/03/2020   Panic attacks 07/20/2017   Depression, major, single episode, moderate (HCC) 06/21/2017   Mood disorder (HCC) 06/21/2017   Anxiety 06/21/2017   Past Medical History:  Diagnosis Date   ADHD (attention deficit hyperactivity disorder)    Anxiety    Bipolar 2 disorder (HCC)    Depression    Heart murmur    Past  Surgical History:  Procedure Laterality Date   CARDIAC SURGERY     CLAVICLE SURGERY     HIP OPEN REDUCTION  1994   labia plasty  03/2023   LAPAROSCOPY N/A 02/24/2022   Procedure: LAPAROSCOPY DIAGNOSTIC WITH EXCISION OF OMPHALOMESENTERIC RENMANT;  Surgeon: Franky Macho, MD;  Location: [facility];  Service: General;  Laterality: N/A;   open heart   1996   PARTIAL HIP ARTHROPLASTY     VENTRAL HERNIA REPAIR N/A 02/24/2022   Procedure: HERNIA REPAIR VENTRAL ADULT W/ MESH;  Surgeon: Franky Macho, MD;  Location: [facility];   Service: General;  Laterality: N/A;   Social History   Tobacco Use   Smoking status: Former    Average packs/day: 0.3 packs/day for 11.0 years (2.8 ttl pk-yrs)    Types: Cigarettes    Start date: 2012   Smokeless tobacco: Never  Vaping Use   Vaping status: Every Day   Substances: Nicotine, Flavoring  Substance Use Topics   Alcohol use: Yes    Comment: once weekly   Drug use: No   Social History   Socioeconomic History   Marital status: Single    Spouse name: Not on file   Number of children: Not on file   Years of education: Not on file   Highest education level: Not on file  Occupational History   Not on file  Tobacco Use   Smoking status: Former    Average packs/day: 0.3 packs/day for 11.0 years (2.8 ttl pk-yrs)    Types: Cigarettes    Start date: 2012   Smokeless tobacco: Never  Vaping Use   Vaping status: Every Day   Substances: Nicotine, Flavoring  Substance and Sexual Activity   Alcohol use: Yes    Comment: once weekly   Drug use: No   Sexual activity: Yes    Birth control/protection: Pill  Other Topics Concern   Not on file  Social History Narrative   ** Merged History Encounter **       Social Drivers of Health   Financial Resource Strain: Low Risk  (06/13/2023)   Overall Financial Resource Strain (CARDIA)    Difficulty of Paying Living Expenses: Not hard at all  Food Insecurity: No Food Insecurity (06/13/2023)   Hunger Vital Sign    Worried About Running Out of Food in the Last Year: Never true    Ran Out of Food in the Last Year: Never true  Transportation Needs: No Transportation Needs (06/13/2023)   PRAPARE - Administrator, Civil Service (Medical): No    Lack of Transportation (Non-Medical): No  Physical Activity: Sufficiently Active (06/13/2023)   Exercise Vital Sign    Days of Exercise per Week: 5 days    Minutes of Exercise per Session: 30 min  Stress: Stress Concern Present (06/13/2023)   [employer] of Occupational  Health - Occupational Stress Questionnaire    Feeling of Stress : To some extent  Social Connections: Socially Isolated (06/13/2023)   Social Connection and Isolation Panel [NHANES]    Frequency of Communication with Friends and Family: Three times a week    Frequency of Social Gatherings with Friends and Family: Once a week    Attends Religious Services: Never    Database administrator or Organizations: No    Attends Banker Meetings: Never    Marital Status: Never married  Intimate Partner Violence: Not At Risk (06/13/2023)   Humiliation, Afraid, Rape, and Kick questionnaire    Fear of  Current or Ex-Partner: No    Emotionally Abused: No    Physically Abused: No    Sexually Abused: No   Family Status  Relation Name Status   Mother  Alive   Father  Deceased   MGM  Deceased   MGF  Alive   PGM  Deceased   PGF  Deceased  No partnership data on file   Family History  Problem Relation Age of Onset   Depression Mother    Hyperlipidemia Mother    Hypertension Mother    Depression Father    Leukemia Father    Diabetes Maternal Grandfather    Diabetes Paternal Grandmother    Diabetes Paternal Grandfather    No Known Allergies    Patient Care Team: Marleena Shubert, Doralee Albino, FNP as PCP - General (Family Medicine) Laqueta Linden, MD (Inactive) as PCP - Cardiology (Cardiology)   Outpatient Medications Prior to Visit  Medication Sig   amphetamine-dextroamphetamine (ADDERALL XR) 30 MG 24 hr capsule Take 30 mg by mouth daily.   amphetamine-dextroamphetamine (ADDERALL) 10 MG tablet Take 10 mg by mouth daily.   lamoTRIgine (LAMICTAL) 150 MG tablet Take 200 mg by mouth at bedtime.   Lurasidone HCl 60 MG TABS Take by mouth.   norethindrone (MICRONOR) 0.35 MG tablet Take 1 tablet (0.35 mg total) by mouth daily.   temazepam (RESTORIL) 30 MG capsule Take 30 mg by mouth at bedtime as needed for sleep.   [DISCONTINUED] amphetamine-dextroamphetamine (ADDERALL XR) 25 MG 24 hr capsule  Take 25 mg by mouth every morning.   [DISCONTINUED] amphetamine-dextroamphetamine (ADDERALL) 5 MG tablet Take 5 mg by mouth daily in the afternoon.   [DISCONTINUED] Multiple Vitamin (MULTIVITAMIN WITH MINERALS) TABS tablet Take 1 tablet by mouth daily.   No facility-administered medications prior to visit.    ROS per HPI, otherwise negative.      Objective:     BP 121/78   Pulse 82   Temp (!) 97.5 F (36.4 C)   Ht 5\' 4"  (1.626 m)   Wt 125 lb 6.4 oz (56.9 kg)   BMI 21.52 kg/m  BP Readings from Last 3 Encounters:  03/06/24 121/78  06/13/23 125/80  03/14/23 106/72   Wt Readings from Last 3 Encounters:  03/06/24 125 lb 6.4 oz (56.9 kg)  06/13/23 124 lb (56.2 kg)  03/14/23 122 lb 8 oz (55.6 kg)   SpO2 Readings from Last 3 Encounters:  03/04/22 99%  02/24/22 100%  02/22/22 100%      Physical Exam Vitals and nursing note reviewed.  Constitutional:      General: She is not in acute distress.    Appearance: Normal appearance. She is well-developed, well-groomed and normal weight. She is not ill-appearing, toxic-appearing or diaphoretic.  HENT:     Head: Normocephalic and atraumatic.     Jaw: There is normal jaw occlusion.     Right Ear: Hearing, tympanic membrane, ear canal and external ear normal.     Left Ear: Hearing, tympanic membrane, ear canal and external ear normal.     Nose: Nose normal.     Mouth/Throat:     Lips: Pink.     Mouth: Mucous membranes are moist.     Pharynx: Oropharynx is clear. Uvula midline.  Eyes:     General: Lids are normal.     Extraocular Movements: Extraocular movements intact.     Conjunctiva/sclera: Conjunctivae normal.     Pupils: Pupils are equal, round, and reactive to light.  Neck:  Thyroid: No thyroid mass, thyromegaly or thyroid tenderness.     Vascular: No carotid bruit or JVD.     Trachea: Trachea and phonation normal.  Cardiovascular:     Rate and Rhythm: Normal rate and regular rhythm.     Chest Wall: PMI is not  displaced.     Pulses: Normal pulses.     Heart sounds: Normal heart sounds. No murmur heard.    No friction rub. No gallop.  Pulmonary:     Effort: Pulmonary effort is normal. No respiratory distress.     Breath sounds: Normal breath sounds. No wheezing.  Abdominal:     General: Bowel sounds are normal. There is no distension or abdominal bruit.     Palpations: Abdomen is soft. There is no hepatomegaly or splenomegaly.     Tenderness: There is no abdominal tenderness. There is no right CVA tenderness or left CVA tenderness.     Hernia: No hernia is present.  Musculoskeletal:     Cervical back: Normal, normal range of motion and neck supple.     Thoracic back: Normal.     Lumbar back: No swelling, edema, deformity, signs of trauma, lacerations, spasms, tenderness or bony tenderness. Normal range of motion. Positive left straight leg raise test. Negative right straight leg raise test. No scoliosis.       Back:     Right lower leg: No edema.     Left lower leg: No edema.  Lymphadenopathy:     Cervical: No cervical adenopathy.  Skin:    General: Skin is warm and dry.     Capillary Refill: Capillary refill takes less than 2 seconds.     Coloration: Skin is not cyanotic, jaundiced or pale.     Findings: No rash.  Neurological:     General: No focal deficit present.     Mental Status: She is alert and oriented to person, place, and time.     Sensory: Sensation is intact.     Motor: Motor function is intact.     Coordination: Coordination is intact.     Gait: Gait is intact.     Deep Tendon Reflexes: Reflexes are normal and symmetric.  Psychiatric:        Attention and Perception: Attention and perception normal.        Mood and Affect: Mood and affect normal.        Speech: Speech normal.        Behavior: Behavior normal. Behavior is cooperative.        Thought Content: Thought content normal.        Cognition and Memory: Cognition and memory normal.        Judgment: Judgment  normal.       Last CBC Lab Results  Component Value Date   WBC 13.1 (H) 08/12/2020   HGB 14.4 08/12/2020   HCT 43.7 08/12/2020   MCV 82 08/12/2020   MCH 27.1 08/12/2020   RDW 14.5 08/12/2020   PLT 274 08/12/2020   Last metabolic panel Lab Results  Component Value Date   GLUCOSE 71 08/12/2020   NA 141 08/12/2020   K 4.3 08/12/2020   CL 103 08/12/2020   CO2 22 08/12/2020   BUN 6 08/12/2020   CREATININE 0.78 08/12/2020   GFRNONAA 105 08/12/2020   CALCIUM 9.3 08/12/2020   PROT 6.7 08/12/2020   ALBUMIN 4.1 08/12/2020   LABGLOB 2.6 08/12/2020   AGRATIO 1.6 08/12/2020   BILITOT 0.2 08/12/2020   ALKPHOS 75  08/12/2020   AST 13 08/12/2020   ALT 12 08/12/2020   ANIONGAP 14 12/14/2019    Assessment & Plan:    Routine Health Maintenance and Physical Exam  Immunization History  Administered Date(s) Administered   DTaP 10/28/1993, 03/01/1994, 06/08/1994, 03/21/1995, 03/18/1998   HIB (PRP-OMP) 10/28/1993, 03/01/1994, 06/08/1994, 03/21/1995   Hep B, Unspecified 10/28/1993, 03/01/1994, 06/08/1994   Hepatitis B 10/28/1993, 03/01/1994, 06/08/1994   IPV 10/28/1993, 03/01/1994, 03/21/1995, 03/18/1998   MMR 12/24/1994, 03/18/1998   Meningococcal Conjugate 02/23/2011   Moderna Sars-Covid-2 Vaccination 04/18/2020, 05/16/2020   Tdap 02/23/2011, 07/30/2019   Varicella 09/22/1994    Health Maintenance  Topic Date Due   HIV Screening  Never done   Hepatitis C Screening  Never done   COVID-19 Vaccine (3 - Moderna risk series) 03/22/2024 (Originally 06/13/2020)   INFLUENZA VACCINE  06/29/2024   Cervical Cancer Screening (HPV/Pap Cotest)  06/12/2026   DTaP/Tdap/Td (8 - Td or Tdap) 07/29/2029   HPV VACCINES  Aged Out    Discussed health benefits of physical activity, and encouraged her to engage in regular exercise appropriate for her age and condition.  Problem List Items Addressed This Visit   None Visit Diagnoses       Annual physical exam    -  Primary   Relevant Orders    Anemia Profile B   CMP14+EGFR   HIV Antibody (routine testing w rflx)   Hepatitis C antibody   Thyroid Panel With TSH   VITAMIN D 25 Hydroxy (Vit-D Deficiency, Fractures)   Lipid panel     Encounter for screening for cardiovascular disorders       Relevant Orders   Anemia Profile B   CMP14+EGFR   Lipid panel     Screening for HIV (human immunodeficiency virus)       Relevant Orders   HIV Antibody (routine testing w rflx)     Need for hepatitis C screening test       Relevant Orders   Hepatitis C antibody     Left sciatic nerve pain       Relevant Medications   amphetamine-dextroamphetamine (ADDERALL XR) 30 MG 24 hr capsule   amphetamine-dextroamphetamine (ADDERALL) 10 MG tablet   Lurasidone HCl 60 MG TABS   temazepam (RESTORIL) 30 MG capsule   methylPREDNISolone acetate (DEPO-MEDROL) injection 40 mg (Completed)     High risk medication use       Relevant Orders   Anemia Profile B   CMP14+EGFR   Thyroid Panel With TSH   VITAMIN D 25 Hydroxy (Vit-D Deficiency, Fractures)   Lipid panel   Lamotrigine level     Assessment and Plan    Sciatica Sharp, knife-like pain localized to the lower buttock with occasional tingling, exacerbated by weight lifting, persisting for months. Differential includes sciatic nerve impingement, possibly due to weight lifting. No bowel or bladder dysfunction. Previous adverse reactions to oral steroids include anxiety, sweating, and increased appetite. - Administer steroid injection to alleviate inflammation and pain as an alternative to oral steroids. - Provide information on sciatica and home stretches. - Order lab work to check for B12 deficiency.  Postnasal Drip Symptoms consistent with postnasal drip. Not currently using allergy medications. - Recommend starting Zyrtec nightly for allergy management.  Medication Monitoring On Lamictal for two years without level monitoring. Last blood work in 2021. Also on Adderall, Micronor, Latuda, and  Restoril, all effective. - Order Lamictal level as part of routine monitoring.  General Health Maintenance In a relationship with no STD  risk factors. No abnormal PAPs. Family history of leukemia in father at age 43. Last cardiac evaluation in 2020 with no follow-up required unless symptoms arise. Discussed routine screenings and preventive measures. - Perform one-time screening for Hepatitis C and HIV. - Update lab work including blood counts and anemia profile. - Advise regular dental and eye check-ups. - Discussed that mammograms start at age 28 unless symptoms arise.       Return in about 1 year (around 03/06/2025) for Annual Physical.     Kari Baars, FNP

## 2024-03-07 ENCOUNTER — Encounter: Payer: Self-pay | Admitting: Family Medicine

## 2024-03-07 LAB — THYROID PANEL WITH TSH
Free Thyroxine Index: 2 (ref 1.2–4.9)
T3 Uptake Ratio: 27 % (ref 24–39)
T4, Total: 7.3 ug/dL (ref 4.5–12.0)
TSH: 1.18 u[IU]/mL (ref 0.450–4.500)

## 2024-03-07 LAB — ANEMIA PROFILE B
Basophils Absolute: 0 10*3/uL (ref 0.0–0.2)
Basos: 1 %
EOS (ABSOLUTE): 0 10*3/uL (ref 0.0–0.4)
Eos: 1 %
Ferritin: 78 ng/mL (ref 15–150)
Folate: 6.1 ng/mL (ref >3.0)
Hematocrit: 42.6 % (ref 34.0–46.6)
Hemoglobin: 14.2 g/dL (ref 11.1–15.9)
Immature Grans (Abs): 0 10*3/uL (ref 0.0–0.1)
Immature Granulocytes: 0 %
Iron Saturation: 19 % (ref 15–55)
Iron: 70 ug/dL (ref 27–159)
Lymphocytes Absolute: 1.7 10*3/uL (ref 0.7–3.1)
Lymphs: 28 %
MCH: 29.5 pg (ref 26.6–33.0)
MCHC: 33.3 g/dL (ref 31.5–35.7)
MCV: 88 fL (ref 79–97)
Monocytes Absolute: 0.3 10*3/uL (ref 0.1–0.9)
Monocytes: 5 %
NRBC: 1 % — ABNORMAL HIGH (ref 0–0)
Neutrophils Absolute: 3.9 10*3/uL (ref 1.4–7.0)
Neutrophils: 65 %
Platelets: 228 10*3/uL (ref 150–450)
RBC: 4.82 x10E6/uL (ref 3.77–5.28)
RDW: 12.2 % (ref 11.7–15.4)
Retic Ct Pct: 0.8 % (ref 0.6–2.6)
Total Iron Binding Capacity: 367 ug/dL (ref 250–450)
UIBC: 297 ug/dL (ref 131–425)
Vitamin B-12: 582 pg/mL (ref 232–1245)
WBC: 6 10*3/uL (ref 3.4–10.8)

## 2024-03-07 LAB — LIPID PANEL
Chol/HDL Ratio: 2.3 ratio (ref 0.0–4.4)
Cholesterol, Total: 153 mg/dL (ref 100–199)
HDL: 67 mg/dL (ref >39)
LDL Chol Calc (NIH): 76 mg/dL (ref 0–99)
Triglycerides: 43 mg/dL (ref 0–149)
VLDL Cholesterol Cal: 10 mg/dL (ref 5–40)

## 2024-03-07 LAB — CMP14+EGFR
ALT: 10 IU/L (ref 0–32)
AST: 21 IU/L (ref 0–40)
Albumin: 4.9 g/dL (ref 4.0–5.0)
Alkaline Phosphatase: 56 IU/L (ref 44–121)
BUN/Creatinine Ratio: 10 (ref 9–23)
BUN: 8 mg/dL (ref 6–20)
Bilirubin Total: 0.3 mg/dL (ref 0.0–1.2)
CO2: 23 mmol/L (ref 20–29)
Calcium: 9.8 mg/dL (ref 8.7–10.2)
Chloride: 105 mmol/L (ref 96–106)
Creatinine, Ser: 0.82 mg/dL (ref 0.57–1.00)
Globulin, Total: 2.2 g/dL (ref 1.5–4.5)
Glucose: 91 mg/dL (ref 70–99)
Potassium: 4.3 mmol/L (ref 3.5–5.2)
Sodium: 143 mmol/L (ref 134–144)
Total Protein: 7.1 g/dL (ref 6.0–8.5)
eGFR: 99 mL/min/{1.73_m2} (ref >59)

## 2024-03-07 LAB — HIV ANTIBODY (ROUTINE TESTING W REFLEX): HIV Screen 4th Generation wRfx: NONREACTIVE

## 2024-03-07 LAB — HEPATITIS C ANTIBODY: Hep C Virus Ab: NONREACTIVE

## 2024-03-07 LAB — LAMOTRIGINE LEVEL: Lamotrigine Lvl: 6 ug/mL (ref 2.0–20.0)

## 2024-03-07 LAB — VITAMIN D 25 HYDROXY (VIT D DEFICIENCY, FRACTURES): Vit D, 25-Hydroxy: 66 ng/mL (ref 30.0–100.0)

## 2024-04-25 DIAGNOSIS — F418 Other specified anxiety disorders: Secondary | ICD-10-CM | POA: Diagnosis not present

## 2024-04-25 DIAGNOSIS — F900 Attention-deficit hyperactivity disorder, predominantly inattentive type: Secondary | ICD-10-CM | POA: Diagnosis not present

## 2024-04-25 DIAGNOSIS — F3181 Bipolar II disorder: Secondary | ICD-10-CM | POA: Diagnosis not present

## 2024-05-16 DIAGNOSIS — F418 Other specified anxiety disorders: Secondary | ICD-10-CM | POA: Diagnosis not present

## 2024-05-16 DIAGNOSIS — F50811 Binge eating disorder, moderate: Secondary | ICD-10-CM | POA: Diagnosis not present

## 2024-05-16 DIAGNOSIS — F900 Attention-deficit hyperactivity disorder, predominantly inattentive type: Secondary | ICD-10-CM | POA: Diagnosis not present

## 2024-05-16 DIAGNOSIS — F3181 Bipolar II disorder: Secondary | ICD-10-CM | POA: Diagnosis not present

## 2024-08-01 ENCOUNTER — Telehealth: Payer: Self-pay | Admitting: Adult Health

## 2024-08-01 MED ORDER — NORETHINDRONE 0.35 MG PO TABS: 1.0000 | ORAL_TABLET | Freq: Every day | ORAL | 3 refills | Status: DC

## 2024-08-01 NOTE — Telephone Encounter (Signed)
 Patient has an appointment for her annual pap on 10/16. Would like a refill sent in to last her til her appointment.

## 2024-08-01 NOTE — Addendum Note (Signed)
 Addended by: Anabella Capshaw A on: 08/01/2024 05:32 PM   Modules accepted: Orders

## 2024-08-01 NOTE — Telephone Encounter (Signed)
 Refill micronor , has appt

## 2024-08-01 NOTE — Telephone Encounter (Signed)
 Pt is requesting a refill on her birth control until she can see you. Thanks! JSY

## 2024-08-03 ENCOUNTER — Other Ambulatory Visit: Payer: Self-pay | Admitting: Adult Health

## 2024-08-13 DIAGNOSIS — F50811 Binge eating disorder, moderate: Secondary | ICD-10-CM | POA: Diagnosis not present

## 2024-08-13 DIAGNOSIS — F418 Other specified anxiety disorders: Secondary | ICD-10-CM | POA: Diagnosis not present

## 2024-08-13 DIAGNOSIS — F3181 Bipolar II disorder: Secondary | ICD-10-CM | POA: Diagnosis not present

## 2024-08-13 DIAGNOSIS — F900 Attention-deficit hyperactivity disorder, predominantly inattentive type: Secondary | ICD-10-CM | POA: Diagnosis not present

## 2024-09-13 ENCOUNTER — Ambulatory Visit (INDEPENDENT_AMBULATORY_CARE_PROVIDER_SITE_OTHER): Payer: Self-pay | Admitting: Adult Health

## 2024-09-13 ENCOUNTER — Encounter: Payer: Self-pay | Admitting: Adult Health

## 2024-09-13 VITALS — BP 111/79 | HR 103 | Ht 64.0 in | Wt 127.5 lb

## 2024-09-13 DIAGNOSIS — Z1331 Encounter for screening for depression: Secondary | ICD-10-CM | POA: Diagnosis not present

## 2024-09-13 DIAGNOSIS — Z01419 Encounter for gynecological examination (general) (routine) without abnormal findings: Secondary | ICD-10-CM

## 2024-09-13 DIAGNOSIS — Z3041 Encounter for surveillance of contraceptive pills: Secondary | ICD-10-CM | POA: Insufficient documentation

## 2024-09-13 MED ORDER — NORETHINDRONE 0.35 MG PO TABS: 1.0000 | ORAL_TABLET | Freq: Every day | ORAL | 4 refills | Status: AC

## 2024-09-13 NOTE — Progress Notes (Signed)
 Patient ID: Paula Elliott, female   DOB: 1991-05-20, 33 y.o.   MRN: [id number] History of Present Illness: Paula Elliott is a 33 year old white female,with SO, G0P0 in for a well woman gyn exam and take about BCP. Seems to want to eat more with Micronor , thinking about going back to lo Loestrin .    Component Value Date/Time   DIAGPAP  06/13/2023 1338    - Negative for intraepithelial lesion or malignancy (NILM)   DIAGPAP (A) 07/03/2020 1513    - Atypical squamous cells of undetermined significance (ASC-US )   HPVHIGH Negative 06/13/2023 1338   HPVHIGH Negative 07/03/2020 1513   ADEQPAP  06/13/2023 1338    Satisfactory for evaluation; transformation zone component PRESENT.   ADEQPAP  07/03/2020 1513    Satisfactory for evaluation; transformation zone component PRESENT.   PCP is [name]   Current Medications, Allergies, Past Medical History, Past Surgical History, Family History and Social History were reviewed in [name] record.     Review of Systems: Patient denies any headaches, hearing loss, fatigue, blurred vision, shortness of breath, chest pain, abdominal pain, problems with bowel movements, urination, or intercourse. No joint pain or mood swings.  See HPI for positives   Physical Exam:BP 111/79 (BP Location: Left Arm, Patient Position: Sitting, Cuff Size: Normal)   Pulse (!) 103   Ht 5' 4 (1.626 m)   Wt 127 lb 8 oz (57.8 kg)   BMI 21.89 kg/m   General:  Well developed, well nourished, no acute distress Skin:  Warm and dry Neck:  Midline trachea, normal thyroid , good ROM, no lymphadenopathy Lungs; Clear to auscultation bilaterally Breast:  No dominant palpable mass, retraction, or nipple discharge Cardiovascular: Regular rate and rhythm, +murmur Abdomen:  Soft, non tender, no hepatosplenomegaly Pelvic:  External genitalia is normal in appearance, no lesions.  The vagina is normal in appearance. Urethra has no lesions or masses. The  cervix is smooth.  Uterus is felt to be normal size, shape, and contour.  No adnexal masses or tenderness noted.Bladder is non tender, no masses felt. Extremities/musculoskeletal:  No swelling or varicosities noted, no clubbing or cyanosis Psych:  No mood changes, alert and cooperative,seems happy AA is 5 Fall risk is low    09/13/2024   10:38 AM 03/06/2024   10:31 AM 06/13/2023    1:32 PM  Depression screen PHQ 2/9  Decreased Interest 1 0 1  Down, Depressed, Hopeless 1 0 1  PHQ - 2 Score 2 0 2  Altered sleeping 0 0 2  Tired, decreased energy 1 3 1   Change in appetite 1 1 2   Feeling bad or failure about yourself  1 0 3  Trouble concentrating 1 0 0  Moving slowly or fidgety/restless 0 0 0  Suicidal thoughts 0 0 0  PHQ-9 Score 6 4 10   Difficult doing work/chores  Not difficult at all        09/13/2024   10:38 AM 03/06/2024   10:32 AM 06/13/2023    1:32 PM 07/03/2020    3:22 PM  GAD 7 : Generalized Anxiety Score  Nervous, Anxious, on Edge 1 2 3 3   Control/stop worrying 1 2 3 3   Worry too much - different things 1 2 3 3   Trouble relaxing 0 0 0 0  Restless 0 0 1 0  Easily annoyed or irritable 1 1 3 1   Afraid - awful might happen 0 0 1 0  Total GAD 7 Score 4 7 14  10  Anxiety Difficulty  Not difficult at all  Not difficult at all      Upstream - 09/13/24 1036       Pregnancy Intention Screening   Does the patient want to become pregnant in the next year? No    Does the patient's partner want to become pregnant in the next year? No    Would the patient like to discuss contraceptive options today? Yes      Contraception Wrap Up   Current Method Oral Contraceptive    End Method Oral Contraceptive    Contraception Counseling Provided Yes          Examination chaperoned by Clarita Salt LPN  Impression and plan: 1. Encounter for well woman exam with routine gynecological exam (Primary) Pap in 2027 Physical in 1 year Labs with PCP  2. Encounter for surveillance of  contraceptive pills Discussed changing to lo loestrin  but would cost more, she says she will stay on Micronor , refill sent on Micronor   Meds ordered this encounter  Medications   norethindrone  (MICRONOR ) 0.35 MG tablet    Sig: Take 1 tablet (0.35 mg total) by mouth daily.    Dispense:  84 tablet    Refill:  4    Supervising Provider:   JAYNE MINDER H [[id number]]

## 2024-11-05 DIAGNOSIS — F50811 Binge eating disorder, moderate: Secondary | ICD-10-CM | POA: Diagnosis not present

## 2024-11-05 DIAGNOSIS — F900 Attention-deficit hyperactivity disorder, predominantly inattentive type: Secondary | ICD-10-CM | POA: Diagnosis not present

## 2024-11-05 DIAGNOSIS — F418 Other specified anxiety disorders: Secondary | ICD-10-CM | POA: Diagnosis not present

## 2024-11-05 DIAGNOSIS — F3181 Bipolar II disorder: Secondary | ICD-10-CM | POA: Diagnosis not present

## 2024-11-19 DIAGNOSIS — F50811 Binge eating disorder, moderate: Secondary | ICD-10-CM | POA: Diagnosis not present

## 2024-11-19 DIAGNOSIS — F900 Attention-deficit hyperactivity disorder, predominantly inattentive type: Secondary | ICD-10-CM | POA: Diagnosis not present

## 2024-11-19 DIAGNOSIS — F3181 Bipolar II disorder: Secondary | ICD-10-CM | POA: Diagnosis not present

## 2024-11-19 DIAGNOSIS — F418 Other specified anxiety disorders: Secondary | ICD-10-CM | POA: Diagnosis not present

## 2024-12-10 ENCOUNTER — Encounter: Payer: Self-pay | Admitting: Family Medicine

## 2025-03-07 ENCOUNTER — Ambulatory Visit

## 2025-03-07 ENCOUNTER — Encounter: Payer: Self-pay | Admitting: Family Medicine
